# Patient Record
Sex: Female | Born: 1955 | Race: White | Hispanic: No | Marital: Married | Smoking: Current every day smoker
Health system: Southern US, Community
[De-identification: ages and names within clinical notes are randomized; demographics above are authoritative.]

## PROBLEM LIST (undated history)

## (undated) DIAGNOSIS — T1491XA Suicide attempt, initial encounter: Secondary | ICD-10-CM

## (undated) DIAGNOSIS — I10 Essential (primary) hypertension: Secondary | ICD-10-CM

## (undated) DIAGNOSIS — F329 Major depressive disorder, single episode, unspecified: Secondary | ICD-10-CM

## (undated) DIAGNOSIS — M5136 Other intervertebral disc degeneration, lumbar region: Secondary | ICD-10-CM

## (undated) DIAGNOSIS — K589 Irritable bowel syndrome without diarrhea: Secondary | ICD-10-CM

## (undated) DIAGNOSIS — F419 Anxiety disorder, unspecified: Secondary | ICD-10-CM

## (undated) DIAGNOSIS — F32A Depression, unspecified: Secondary | ICD-10-CM

## (undated) DIAGNOSIS — T7840XA Allergy, unspecified, initial encounter: Secondary | ICD-10-CM

## (undated) DIAGNOSIS — M51369 Other intervertebral disc degeneration, lumbar region without mention of lumbar back pain or lower extremity pain: Secondary | ICD-10-CM

## (undated) DIAGNOSIS — M5430 Sciatica, unspecified side: Secondary | ICD-10-CM

## (undated) DIAGNOSIS — E785 Hyperlipidemia, unspecified: Secondary | ICD-10-CM

## (undated) DIAGNOSIS — F4310 Post-traumatic stress disorder, unspecified: Secondary | ICD-10-CM

## (undated) HISTORY — PX: ABDOMINAL HYSTERECTOMY: SHX81

## (undated) HISTORY — DX: Essential (primary) hypertension: I10

## (undated) HISTORY — DX: Post-traumatic stress disorder, unspecified: F43.10

## (undated) HISTORY — DX: Allergy, unspecified, initial encounter: T78.40XA

## (undated) HISTORY — PX: SHOULDER ARTHROSCOPY WITH ROTATOR CUFF REPAIR: SHX5685

## (undated) HISTORY — DX: Irritable bowel syndrome, unspecified: K58.9

## (undated) HISTORY — PX: MANDIBLE SURGERY: SHX707

## (undated) HISTORY — DX: Hyperlipidemia, unspecified: E78.5

---

## 2004-03-21 ENCOUNTER — Ambulatory Visit: Payer: Self-pay | Admitting: Anesthesiology

## 2004-06-19 HISTORY — PX: NECK SURGERY: SHX720

## 2004-07-04 ENCOUNTER — Ambulatory Visit: Payer: Self-pay | Admitting: Anesthesiology

## 2004-08-30 ENCOUNTER — Ambulatory Visit: Payer: Self-pay | Admitting: Anesthesiology

## 2005-04-03 ENCOUNTER — Ambulatory Visit: Payer: Self-pay

## 2005-05-01 ENCOUNTER — Ambulatory Visit: Payer: Self-pay | Admitting: Unknown Physician Specialty

## 2006-03-22 ENCOUNTER — Ambulatory Visit: Payer: Self-pay | Admitting: Unknown Physician Specialty

## 2006-06-21 ENCOUNTER — Ambulatory Visit: Payer: Self-pay | Admitting: Unknown Physician Specialty

## 2006-09-05 ENCOUNTER — Emergency Department: Payer: Self-pay | Admitting: Emergency Medicine

## 2006-10-30 ENCOUNTER — Emergency Department: Payer: Self-pay | Admitting: Emergency Medicine

## 2007-11-11 ENCOUNTER — Emergency Department: Payer: Self-pay | Admitting: Emergency Medicine

## 2007-11-21 ENCOUNTER — Emergency Department: Payer: Self-pay | Admitting: Emergency Medicine

## 2009-03-16 IMAGING — CR PELVIS - 1-2 VIEW
1 series · 1 of 1 positions shown · non-contrast
Comparison: none

REASON FOR EXAM: fall, pain in ramus of hip
COMMENTS:   LMP: Post Hysterectomy

PROCEDURE:     DXR - DXR PELVIS AP ONLY  - November 21, 2007  [DATE]
RESULT:     No fracture, dislocation or other acute bony abnormality is
identified. The hip joint spaces are bilaterally symmetrical.  Sacroiliac
joints are normal in appearance.

[view not recorded]
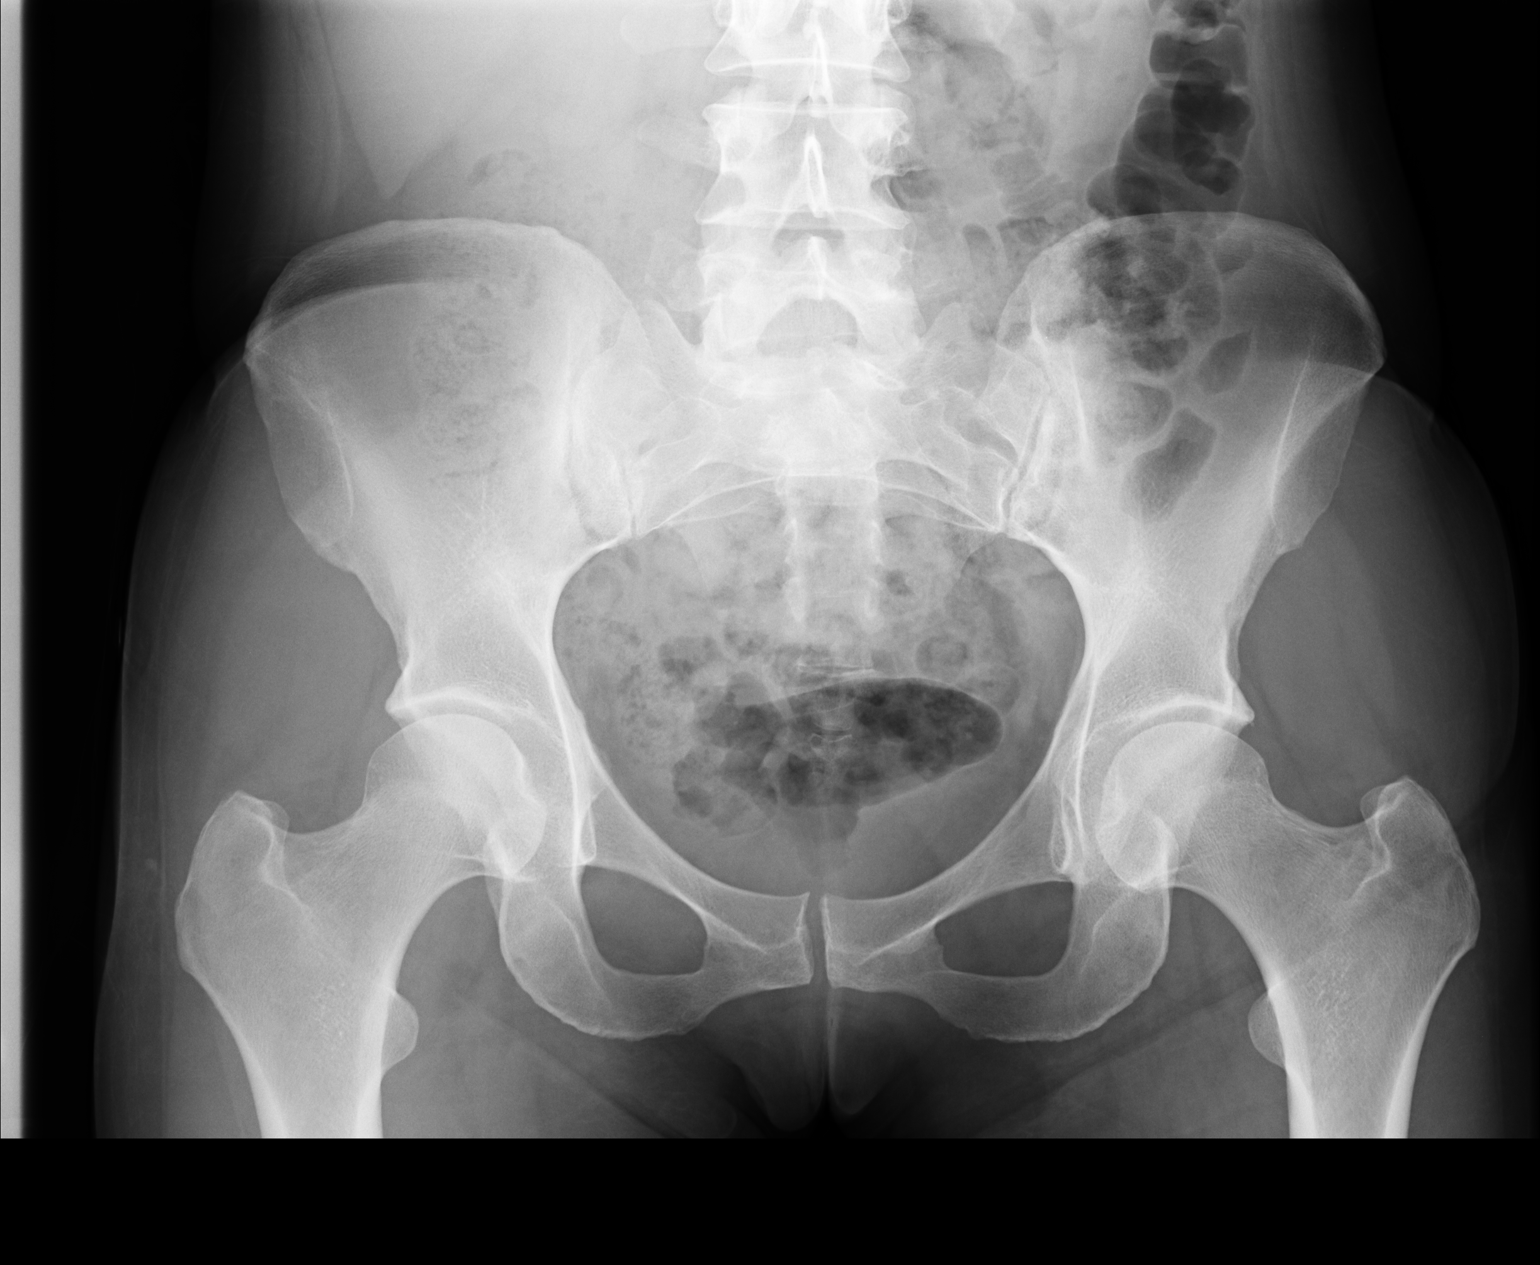

[1 of 1 positions shown; findings below may reference images not displayed]

IMPRESSION: No significant abnormalities are noted.

## 2009-12-14 ENCOUNTER — Inpatient Hospital Stay: Payer: Self-pay | Admitting: Psychiatry

## 2010-02-15 ENCOUNTER — Emergency Department: Payer: Self-pay | Admitting: Emergency Medicine

## 2011-10-31 ENCOUNTER — Ambulatory Visit: Payer: Self-pay | Admitting: Pediatrics

## 2012-07-14 ENCOUNTER — Emergency Department: Payer: Self-pay | Admitting: Emergency Medicine

## 2012-10-03 DIAGNOSIS — K219 Gastro-esophageal reflux disease without esophagitis: Secondary | ICD-10-CM | POA: Insufficient documentation

## 2012-10-03 DIAGNOSIS — E785 Hyperlipidemia, unspecified: Secondary | ICD-10-CM | POA: Insufficient documentation

## 2013-10-17 DIAGNOSIS — F329 Major depressive disorder, single episode, unspecified: Secondary | ICD-10-CM | POA: Insufficient documentation

## 2014-10-03 ENCOUNTER — Emergency Department
Admit: 2014-10-03 | Disposition: A | Discharge status: Home / Self Care | Payer: Self-pay | Admitting: Physician Assistant

## 2014-10-03 LAB — BASIC METABOLIC PANEL
Anion Gap: 10 (ref 7–16)
BUN: 13 mg/dL
CHLORIDE: 105 mmol/L
CO2: 25 mmol/L
Calcium, Total: 9.5 mg/dL
Creatinine: 0.65 mg/dL
EGFR (African American): >60
Glucose: 106 mg/dL — ABNORMAL HIGH
POTASSIUM: 3.7 mmol/L
Sodium: 140 mmol/L

## 2014-10-03 LAB — CBC WITH DIFFERENTIAL/PLATELET
BASOS ABS: 0 10*3/uL (ref 0.0–0.1)
BASOS PCT: 0.5 %
EOS ABS: 0.1 10*3/uL (ref 0.0–0.7)
Eosinophil %: 1.1 %
HCT: 42 % (ref 35.0–47.0)
HGB: 13.9 g/dL (ref 12.0–16.0)
LYMPHS PCT: 36.8 %
Lymphocyte #: 3.5 10*3/uL (ref 1.0–3.6)
MCH: 29.6 pg (ref 26.0–34.0)
MCHC: 33.2 g/dL (ref 32.0–36.0)
MCV: 89 fL (ref 80–100)
MONO ABS: 0.5 x10 3/mm (ref 0.2–0.9)
Monocyte %: 5.5 %
NEUTROS PCT: 56.1 %
Neutrophil #: 5.3 10*3/uL (ref 1.4–6.5)
PLATELETS: 358 10*3/uL (ref 150–440)
RBC: 4.71 10*6/uL (ref 3.80–5.20)
RDW: 14.7 % — AB (ref 11.5–14.5)
WBC: 9.4 10*3/uL (ref 3.6–11.0)

## 2014-10-03 LAB — URINALYSIS, COMPLETE
Bilirubin,UR: NEGATIVE
Blood: NEGATIVE
GLUCOSE, UR: NEGATIVE mg/dL (ref 0–75)
LEUKOCYTE ESTERASE: NEGATIVE
NITRITE: NEGATIVE
Ph: 5 (ref 4.5–8.0)
Protein: NEGATIVE
Specific Gravity: 1.028 (ref 1.003–1.030)

## 2014-10-03 LAB — WET PREP, GENITAL

## 2014-10-03 LAB — GC/CHLAMYDIA PROBE AMP

## 2015-03-08 ENCOUNTER — Encounter: Payer: Self-pay | Admitting: Emergency Medicine

## 2015-03-08 ENCOUNTER — Emergency Department
Admission: EM | Admit: 2015-03-08 | Discharge: 2015-03-08 | Discharge status: Against Medical Advice | Payer: No Typology Code available for payment source | Attending: Emergency Medicine | Admitting: Emergency Medicine

## 2015-03-08 DIAGNOSIS — Z880 Allergy status to penicillin: Secondary | ICD-10-CM | POA: Insufficient documentation

## 2015-03-08 DIAGNOSIS — Z720 Tobacco use: Secondary | ICD-10-CM | POA: Diagnosis not present

## 2015-03-08 DIAGNOSIS — M549 Dorsalgia, unspecified: Secondary | ICD-10-CM | POA: Diagnosis not present

## 2015-03-08 DIAGNOSIS — R300 Dysuria: Secondary | ICD-10-CM | POA: Insufficient documentation

## 2015-03-08 DIAGNOSIS — N898 Other specified noninflammatory disorders of vagina: Secondary | ICD-10-CM | POA: Diagnosis not present

## 2015-03-08 DIAGNOSIS — R109 Unspecified abdominal pain: Secondary | ICD-10-CM | POA: Diagnosis present

## 2015-03-08 HISTORY — DX: Anxiety disorder, unspecified: F41.9

## 2015-03-08 LAB — CBC
HCT: 41.6 % (ref 35.0–47.0)
Hemoglobin: 14 g/dL (ref 12.0–16.0)
MCH: 29.8 pg (ref 26.0–34.0)
MCHC: 33.5 g/dL (ref 32.0–36.0)
MCV: 88.7 fL (ref 80.0–100.0)
PLATELETS: 326 10*3/uL (ref 150–440)
RBC: 4.69 MIL/uL (ref 3.80–5.20)
RDW: 14.4 % (ref 11.5–14.5)
WBC: 8.2 10*3/uL (ref 3.6–11.0)

## 2015-03-08 LAB — COMPREHENSIVE METABOLIC PANEL
ALK PHOS: 67 U/L (ref 38–126)
ALT: 63 U/L — AB (ref 14–54)
AST: 70 U/L — ABNORMAL HIGH (ref 15–41)
Albumin: 4.6 g/dL (ref 3.5–5.0)
Anion gap: 9 (ref 5–15)
BUN: 8 mg/dL (ref 6–20)
CALCIUM: 9.7 mg/dL (ref 8.9–10.3)
CO2: 26 mmol/L (ref 22–32)
CREATININE: 0.59 mg/dL (ref 0.44–1.00)
Chloride: 103 mmol/L (ref 101–111)
GFR calc Af Amer: >60 mL/min (ref >60)
GFR calc non Af Amer: >60 mL/min (ref >60)
Glucose, Bld: 102 mg/dL — ABNORMAL HIGH (ref 65–99)
Potassium: 4.3 mmol/L (ref 3.5–5.1)
SODIUM: 138 mmol/L (ref 135–145)
Total Bilirubin: 0.8 mg/dL (ref 0.3–1.2)
Total Protein: 8.3 g/dL — ABNORMAL HIGH (ref 6.5–8.1)

## 2015-03-08 LAB — URINALYSIS COMPLETE WITH MICROSCOPIC (ARMC ONLY)
BILIRUBIN URINE: NEGATIVE
Bacteria, UA: NONE SEEN
Glucose, UA: NEGATIVE mg/dL
Hgb urine dipstick: NEGATIVE
Ketones, ur: NEGATIVE mg/dL
Leukocytes, UA: NEGATIVE
Nitrite: NEGATIVE
Protein, ur: NEGATIVE mg/dL
SPECIFIC GRAVITY, URINE: 1.006 (ref 1.005–1.030)
pH: 7 (ref 5.0–8.0)

## 2015-03-08 NOTE — ED Notes (Signed)
Pt to ed with c/o abd pain and nausea x 1 year.  Pt also reports burning with urination. Denies vomiting, denies diarrhea.

## 2015-03-08 NOTE — ED Notes (Signed)
Patient opened her room door and yelled "what the fuck is going on" and then about a minute later stormed out of her room leaving while saying " 45 minutes like that, what the fuck is wrong with you"

## 2015-03-08 NOTE — ED Provider Notes (Signed)
[facility] Emergency Department Provider Note  ____________________________________________  Time seen: 1435  I have reviewed the triage vital signs and the nursing notes.   HISTORY  Chief Complaint Abdominal Pain     HPI Katrina Holmes is a 59 y.o. female who presents to the emergency department due to dysuria. She reports she urinated this morning and it "felt like my bladder was going to come out".  The patient also reports having a vaginal discharge, yellow in color.  The patient reports these problems and been going on for one year. They began after she had spent time in her sister's hot of. She says she thinks that the pH was off. She did go to see her regular doctor at that time and was treated for a urinary tract infection. She has not been able to see her doctor recently.  During our discussion, when I tried to pin down acute signs and symptoms, the patient keeps referring to the start of all the symptoms being approximately one year ago and being consistent. She reports she has had other episodes of severe dysuria. She denies having a bulge or other physical sign of bladder prolapse. She is status post hysterectomy many years ago.    Past Medical History  Diagnosis Date  . Anxiety     There are no active problems to display for this patient.   History reviewed. No pertinent past surgical history.  No current outpatient prescriptions on file.  Allergies Nsaids and Penicillins  History reviewed. No pertinent family history.  Social History Social History  Substance Use Topics  . Smoking status: Current Every Day Smoker  . Smokeless tobacco: None  . Alcohol Use: No    Review of Systems  Constitutional: Negative for fever. ENT: Negative for sore throat. Cardiovascular: Negative for chest pain. Respiratory: Negative for shortness of breath. Gastrointestinal: Negative for abdominal pain, vomiting and diarrhea. Genitourinary:  Positive for dysuria. Positive for vaginal discharge Musculoskeletal: History of back pain with sciatica into the left leg. Skin: Negative for rash. Neurological: Negative for headaches   10-point ROS otherwise negative.  ____________________________________________   PHYSICAL EXAM:  VITAL SIGNS: ED Triage Vitals  Enc Vitals Group     BP 03/08/15 1156 141/98 mmHg     Pulse Rate 03/08/15 1156 80     Resp 03/08/15 1156 20     Temp 03/08/15 1156 98.2 F (36.8 C)     Temp Source 03/08/15 1156 Oral     SpO2 03/08/15 1156 99 %     Weight 03/08/15 1156 130 lb (58.968 kg)     Height 03/08/15 1156  (1.676 m)     Head Cir --      Peak Flow --      Pain Score 03/08/15 1156 7     Pain Loc --      Pain Edu? --      Excl. in GC? --     Constitutional: Alert and oriented. Appears slightly uncomfortable but overall no acute distress.[facility]   Head: Normocephalic and atraumatic.   Nose: No congestion/rhinnorhea. Cardiovascular: Normal rate, regular rhythm, no murmur noted Respiratory:  Normal respiratory effort, no tachypnea.    Breath sounds are clear and equal bilaterally.  Gastrointestinal: Soft. Minimal tenderness in the central suprapubic area. No distention.  Back: No muscle spasm, mild tenderness tenderness, no CVA tenderness. Musculoskeletal: No deformity noted. Nontender with normal range of motion in all extremities.  No noted edema. Neurologic:  Normal speech  and language. No gross focal neurologic deficits are appreciated.  Skin:  Skin is warm, dry. No rash noted. Psychiatric: Mood and affect are normal. Speech and behavior are normal.  Pelvic:  - Patient left the emergency department before this could be done ____________________________________________    LABS (pertinent positives/negatives)  Labs Reviewed  COMPREHENSIVE METABOLIC PANEL - Abnormal; Notable for the following:    Glucose, Bld 102 (*)    Total Protein 8.3 (*)    AST 70 (*)    ALT 63 (*)     All other components within normal limits  URINALYSIS COMPLETEWITH MICROSCOPIC ([facility] ONLY) - Abnormal; Notable for the following:    Color, Urine STRAW (*)    APPearance CLEAR (*)    Squamous Epithelial / LPF 0-5 (*)    All other components within normal limits  CBC      ____________________________________________   INITIAL IMPRESSION / ASSESSMENT AND PLAN / ED COURSE  Pertinent labs & imaging results that were available during my care of the patient were reviewed by me and considered in my medical decision making (see chart for details).  59 year old female with complaint of vaginal discharge and dysuria. Her lab tests are reasonable. She has a minimal elevation of her transaminases. I discussed this with the patient. No history of hepatitis. She does not drink on a regular basis. Should 2 glasses of wine this weekend.  The patient told the nurse separately from me that she has had bacterial vaginosis twice in the past year and has been treated for it. The patient's focus seems to be this vaginal discharge. We will do a pelvic exam to assess for bacterial vaginosis as well as to insure that she does not have any appearance of a bladder prolapse.  ----------------------------------------- 3:41 PM on 03/08/2015 -----------------------------------------  I've just been told that the patient left angry from her room. She had been waiting approximately half an hour for me to return to do the pelvic exam. The emergency department is currently busy, The patient's condition was stable, and I wanted to be sure to see the 2 patients that I had not seen yet were stable and had their evaluation and treatment was underway.   ----------------------------------------- 3:47 PM on 03/08/2015 -----------------------------------------  I just called and spoke with the patient. She answered the phone promptly. She reports that her sciatica was bothering her more and she couldn't remain seated and she  was agitated so she left. I offered to have her come back and we would complete the examination, but she prefers to go home and rest her back currently. I offered to call in a prescription for metronidazole for her, but the patient reports that she has been metronidazole 4 times this year. With that history, I do not believe there is benefit in treatment with metronidazole without further evaluation and the patient agrees. I did not call in a prescription for medication for her.  I discussed options with her for further care. She reports her doctor's out on medical leave until October. I suggested she go see a gynecologist for the vaginal discharge and checked the on-call schedule. [facility] is on call for unassigned patients. I told her to call [facility] for follow-up. She then told me that her regular doctor does woman's care. The patient prefers to wait for her doctor to come back to town. I advised her to return to the emergency department if she has further urgent concerns. She seemed appreciative of my phone call and the  care that I tried to provide.  ____________________________________________   FINAL CLINICAL IMPRESSION(S) / ED DIAGNOSES  Final diagnoses:  Dysuria  Vaginal discharge      Darien Ramus, MD 03/08/15 1550

## 2015-04-29 ENCOUNTER — Emergency Department
Admission: EM | Admit: 2015-04-29 | Discharge: 2015-04-29 | Disposition: A | Discharge status: Home / Self Care | Payer: No Typology Code available for payment source | Attending: Emergency Medicine | Admitting: Emergency Medicine

## 2015-04-29 ENCOUNTER — Encounter: Payer: Self-pay | Admitting: *Deleted

## 2015-04-29 ENCOUNTER — Emergency Department: Payer: No Typology Code available for payment source

## 2015-04-29 DIAGNOSIS — Z880 Allergy status to penicillin: Secondary | ICD-10-CM | POA: Insufficient documentation

## 2015-04-29 DIAGNOSIS — Z720 Tobacco use: Secondary | ICD-10-CM | POA: Insufficient documentation

## 2015-04-29 DIAGNOSIS — R110 Nausea: Secondary | ICD-10-CM | POA: Insufficient documentation

## 2015-04-29 DIAGNOSIS — R309 Painful micturition, unspecified: Secondary | ICD-10-CM | POA: Insufficient documentation

## 2015-04-29 DIAGNOSIS — R109 Unspecified abdominal pain: Secondary | ICD-10-CM | POA: Diagnosis present

## 2015-04-29 DIAGNOSIS — R1031 Right lower quadrant pain: Secondary | ICD-10-CM | POA: Insufficient documentation

## 2015-04-29 DIAGNOSIS — R630 Anorexia: Secondary | ICD-10-CM | POA: Insufficient documentation

## 2015-04-29 LAB — URINALYSIS COMPLETE WITH MICROSCOPIC (ARMC ONLY)
BACTERIA UA: NONE SEEN
BILIRUBIN URINE: NEGATIVE
GLUCOSE, UA: NEGATIVE mg/dL
Hgb urine dipstick: NEGATIVE
Ketones, ur: NEGATIVE mg/dL
Leukocytes, UA: NEGATIVE
Nitrite: NEGATIVE
Protein, ur: NEGATIVE mg/dL
Specific Gravity, Urine: 1.003 — ABNORMAL LOW (ref 1.005–1.030)
Squamous Epithelial / LPF: NONE SEEN
pH: 6 (ref 5.0–8.0)

## 2015-04-29 LAB — COMPREHENSIVE METABOLIC PANEL
ALK PHOS: 54 U/L (ref 38–126)
ALT: 18 U/L (ref 14–54)
ANION GAP: 7 (ref 5–15)
AST: 20 U/L (ref 15–41)
Albumin: 4.2 g/dL (ref 3.5–5.0)
BILIRUBIN TOTAL: 0.7 mg/dL (ref 0.3–1.2)
BUN: 10 mg/dL (ref 6–20)
CALCIUM: 9.3 mg/dL (ref 8.9–10.3)
CO2: 24 mmol/L (ref 22–32)
Chloride: 106 mmol/L (ref 101–111)
Creatinine, Ser: 0.45 mg/dL (ref 0.44–1.00)
Glucose, Bld: 94 mg/dL (ref 65–99)
Potassium: 3.8 mmol/L (ref 3.5–5.1)
Sodium: 137 mmol/L (ref 135–145)
TOTAL PROTEIN: 7.7 g/dL (ref 6.5–8.1)

## 2015-04-29 LAB — CBC
HCT: 39.2 % (ref 35.0–47.0)
HEMOGLOBIN: 13 g/dL (ref 12.0–16.0)
MCH: 28.9 pg (ref 26.0–34.0)
MCHC: 33.2 g/dL (ref 32.0–36.0)
MCV: 87.1 fL (ref 80.0–100.0)
Platelets: 327 10*3/uL (ref 150–440)
RBC: 4.5 MIL/uL (ref 3.80–5.20)
RDW: 14 % (ref 11.5–14.5)
WBC: 8.9 10*3/uL (ref 3.6–11.0)

## 2015-04-29 LAB — LIPASE, BLOOD: Lipase: 39 U/L (ref 11–51)

## 2015-04-29 MED ORDER — ONDANSETRON 4 MG PO TBDP
4.0000 mg | ORAL_TABLET | Freq: Once | ORAL | Status: AC
  Administered 2015-04-29: 4 mg via ORAL
  Filled 2015-04-29: qty 1

## 2015-04-29 MED ORDER — HYDROMORPHONE HCL 1 MG/ML IJ SOLN
1.0000 mg | Freq: Once | INTRAMUSCULAR | Status: AC
  Administered 2015-04-29: 1 mg via INTRAVENOUS
  Filled 2015-04-29: qty 1

## 2015-04-29 MED ORDER — PROCHLORPERAZINE MALEATE 10 MG PO TABS: 10.0000 mg | ORAL_TABLET | Freq: Four times a day (QID) | ORAL | Status: DC | PRN

## 2015-04-29 MED ORDER — IOHEXOL 240 MG/ML SOLN
25.0000 mL | Freq: Once | INTRAMUSCULAR | Status: AC | PRN
  Administered 2015-04-29: 25 mL via ORAL

## 2015-04-29 MED ORDER — TRAMADOL HCL 50 MG PO TABS: 50.0000 mg | ORAL_TABLET | Freq: Four times a day (QID) | ORAL | Status: DC | PRN

## 2015-04-29 MED ORDER — TRAMADOL HCL 50 MG PO TABS
50.0000 mg | ORAL_TABLET | Freq: Once | ORAL | Status: AC
  Administered 2015-04-29: 50 mg via ORAL
  Filled 2015-04-29: qty 1

## 2015-04-29 MED ORDER — ONDANSETRON HCL 4 MG/2ML IJ SOLN
4.0000 mg | Freq: Once | INTRAMUSCULAR | Status: AC
  Administered 2015-04-29: 4 mg via INTRAVENOUS
  Filled 2015-04-29: qty 2

## 2015-04-29 MED ORDER — IOHEXOL 300 MG/ML  SOLN
100.0000 mL | Freq: Once | INTRAMUSCULAR | Status: AC | PRN
  Administered 2015-04-29: 100 mL via INTRAVENOUS

## 2015-04-29 NOTE — ED Provider Notes (Signed)
Time Seen: Approximately ----------------------------------------- 11:04 AM on 04/29/2015 -----------------------------------------   I have reviewed the triage notes  Chief Complaint: Abdominal Pain   History of Present Illness: Katrina Holmes is a 59 y.o. female who presents with a 2 to three-day history of constant to waves of discomfort in the right lower quadrant. She describes the pain as sharp pain with some associated nausea decreased appetite, she denies any persistent vomiting. She is not aware of any fever home at home. She denies any flank pain. She denies any current vaginal discharge or drainage. She states she has noticed some burning with urination but otherwise no frequency or blood in her urine.   Past Medical History  Diagnosis Date  . Anxiety     There are no active problems to display for this patient.   History reviewed. No pertinent past surgical history.  History reviewed. No pertinent past surgical history.  No current outpatient prescriptions on file.  Allergies:  Nsaids and Penicillins  Family History: No family history on file.  Social History: Social History  Substance Use Topics  . Smoking status: Current Every Day Smoker -- 0.50 packs/day    Types: Cigarettes  . Smokeless tobacco: None  . Alcohol Use: No     Review of Systems:   10 point review of systems was performed and was otherwise negative:  Constitutional: No fever Eyes: No visual disturbances ENT: No sore throat, ear pain Cardiac: No chest pain Respiratory: No shortness of breath, wheezing, or stridor Abdomen: Sharp constant right lower quadrant pain, no current emesis, No diarrhea Endocrine: No weight loss, No night sweats Extremities: No peripheral edema, cyanosis Skin: No rashes, easy bruising Neurologic: No focal weakness, trouble with speech or swollowing Urologic: NoHematuria, or urinary frequency   Physical Exam:  ED Triage Vitals  Enc Vitals Group   BP 04/29/15 1022 146/78 mmHg     Pulse Rate 04/29/15 1022 91     Resp 04/29/15 1022 20     Temp 04/29/15 1022 98.6 F (37 C)     Temp Source 04/29/15 1022 Oral     SpO2 04/29/15 1022 98 %     Weight 04/29/15 1022 130 lb (58.968 kg)     Height 04/29/15 1022  (1.676 m)     Head Cir --      Peak Flow --      Pain Score 04/29/15 1023 8     Pain Loc --      Pain Edu? --      Excl. in GC? --     General: Awake , Alert , and Oriented times 3; GCS 15 Head: Normal cephalic , atraumatic Eyes: Pupils equal , round, reactive to light Nose/Throat: No nasal drainage, patent upper airway without erythema or exudate.  Neck: Supple, Full range of motion, No anterior adenopathy or palpable thyroid masses Lungs: Clear to ascultation without wheezes , rhonchi, or rales Heart: Regular rate, regular rhythm without murmurs , gallops , or rubs Abdomen pain to deep palpation right lower quadrant without any rebound, guarding , or rigidity; bowel sounds positive and symmetric in all 4 quadrants. No organomegaly .       Mild tenderness toward McBurney's point but most the pain is toward her right groin region. No palpable masses Extremities: 2 plus symmetric pulses. No edema, clubbing or cyanosis Neurologic: normal ambulation, Motor symmetric without deficits, sensory intact Skin: warm, dry, no rashes   Labs:   All laboratory work was reviewed including any  pertinent negatives or positives listed below:  Labs Reviewed  CBC  LIPASE, BLOOD  COMPREHENSIVE METABOLIC PANEL  URINALYSIS COMPLETEWITH MICROSCOPIC ([facility] ONLY)    E  Radiology:  EXAM: CT ABDOMEN AND PELVIS WITH CONTRAST  TECHNIQUE: Multidetector CT imaging of the abdomen and pelvis was performed using the standard protocol following bolus administration of intravenous contrast.  CONTRAST: 100mL OMNIPAQUE IOHEXOL 300 MG/ML IV. Oral contrast was also administered.  COMPARISON: 02/15/2010.  FINDINGS: Lower chest: Heart size  normal. Visualized lung bases clear.  Hepatobiliary: Liver normal in size and appearance. Gallbladder normal in appearance without calcified gallstones. No biliary ductal dilation.  Pancreas: Normal in appearance without evidence of mass, ductal dilation, or inflammation.  Spleen: Normal in size and appearance.  Adrenals/Urinary Tract: Normal appearing adrenal glands. Kidneys normal in size and appearance without focal parenchymal abnormality. No evidence of urinary tract calculi or obstruction. Normal-appearing urinary bladder.  Stomach/Bowel: Stomach normal in appearance for the degree of distention. Normal-appearing small bowel. Normal-appearing colon with expected stool burden. Cecum extends low in the pelvis. A long retrocecal appendix extends from the lower pelvis back up into the upper pelvis and is normal in appearance.  Vascular/Lymphatic: No pathologic lymphadenopathy. 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 Patent visceral arteries.  Reproductive: Uterus not visualized and presumed surgically absent. No adnexal masses.  Other: None.  Musculoskeletal: Facet degenerative changes at L5-S1. Regional skeleton intact otherwise without acute abnormalities.  IMPRESSION: 1. No abnormalities identified to explain right lower quadrant abdominal pain. Specifically, no evidence of appendicitis. 2. 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   Electronically Signed By: Hulan Saashomas Lawrence M.D. On: 04/29/2015 13:29   I personally reviewed the radiologic studies    ED Course:  Differential diagnosis includes but is not exclusive to ovarian cyst, ovarian torsion, acute appendicitis, urinary tract infection, endometriosis, bowel obstruction, colitis, renal colic, gastroenteritis, etc. Differential also included ischemic bowel disease. I felt given her current clinical presentation and objective findings her was  no obvious surgical issues with her presentation at this time. Her abdominal CT is negative and laboratory work is all within normal limits. Urinary sample does not show any indications of an infection, etc. Further bedside discussion with the patient apparently she's had other episodes very similar to this as far as her abdominal pain goes and is had colonoscopy as an outpatient that was benign. Patient states she was on Bentyl but maybe had an allergic reaction. This may be viral in its etiology or irritable bowel syndrome, either way, I felt patient treated on an outpatient basis.    Assessment:  Acute unspecified abdominal pain     Plan:  Outpatient management Patient was advised to return immediately if condition worsens. Patient was advised to follow up with her primary care physician or other specialized physicians involved and in their current assessment. Patient was advised especially return here shows a fever, bloody stool, uncontrolled vomiting. She was prescribed Ultram for pain along with continuing Tylenol over-the-counter or home and Compazine for the nausea.            Jennye MoccasinBrian S Deveion Denz, MD 04/29/15 [phone]

## 2015-04-29 NOTE — ED Notes (Signed)
PT FINISHED DRINKING CONTRAST. CT NOTIFIED. 

## 2015-04-29 NOTE — ED Notes (Signed)
Pt reports RLQ sharp and stabbing abdominal pain X 3 days. Nausea, not relieved by medication. Pt denies vomiting or diarrhea. Pt also reports that she feels wetness in her brief and itching which began around the same time as abdominal pain. Hx of vaginitis X 1 year ago after getting into a hot tub. Pt states that she has not been sexually active X 2 years. Pt alert and oriented X4, active, cooperative, pt in NAD. RR even and unlabored, color WNL.

## 2015-04-29 NOTE — Discharge Instructions (Signed)

## 2015-04-29 NOTE — ED Notes (Signed)
Pt reports nausea and abdominal cramping below the navel since Sunday

## 2015-05-25 ENCOUNTER — Ambulatory Visit: Payer: No Typology Code available for payment source | Attending: Pain Medicine | Admitting: Pain Medicine

## 2015-05-25 ENCOUNTER — Encounter: Payer: Self-pay | Admitting: Pain Medicine

## 2015-05-25 VITALS — BP 134/67 | HR 95 | Temp 98.5°F | Resp 16 | Ht 66.0 in | Wt 130.0 lb

## 2015-05-25 DIAGNOSIS — M47816 Spondylosis without myelopathy or radiculopathy, lumbar region: Secondary | ICD-10-CM

## 2015-05-25 DIAGNOSIS — M5126 Other intervertebral disc displacement, lumbar region: Secondary | ICD-10-CM | POA: Insufficient documentation

## 2015-05-25 DIAGNOSIS — F419 Anxiety disorder, unspecified: Secondary | ICD-10-CM | POA: Insufficient documentation

## 2015-05-25 DIAGNOSIS — K219 Gastro-esophageal reflux disease without esophagitis: Secondary | ICD-10-CM | POA: Diagnosis not present

## 2015-05-25 DIAGNOSIS — M533 Sacrococcygeal disorders, not elsewhere classified: Secondary | ICD-10-CM | POA: Diagnosis not present

## 2015-05-25 DIAGNOSIS — M19011 Primary osteoarthritis, right shoulder: Secondary | ICD-10-CM

## 2015-05-25 DIAGNOSIS — M79604 Pain in right leg: Secondary | ICD-10-CM | POA: Diagnosis present

## 2015-05-25 DIAGNOSIS — M79605 Pain in left leg: Secondary | ICD-10-CM | POA: Diagnosis present

## 2015-05-25 DIAGNOSIS — M5136 Other intervertebral disc degeneration, lumbar region: Secondary | ICD-10-CM | POA: Insufficient documentation

## 2015-05-25 DIAGNOSIS — M48062 Spinal stenosis, lumbar region with neurogenic claudication: Secondary | ICD-10-CM

## 2015-05-25 DIAGNOSIS — M47812 Spondylosis without myelopathy or radiculopathy, cervical region: Secondary | ICD-10-CM

## 2015-05-25 DIAGNOSIS — Z9889 Other specified postprocedural states: Secondary | ICD-10-CM

## 2015-05-25 DIAGNOSIS — M51369 Other intervertebral disc degeneration, lumbar region without mention of lumbar back pain or lower extremity pain: Secondary | ICD-10-CM | POA: Insufficient documentation

## 2015-05-25 DIAGNOSIS — M5030 Other cervical disc degeneration, unspecified cervical region: Secondary | ICD-10-CM

## 2015-05-25 DIAGNOSIS — M19019 Primary osteoarthritis, unspecified shoulder: Secondary | ICD-10-CM | POA: Insufficient documentation

## 2015-05-25 DIAGNOSIS — M4806 Spinal stenosis, lumbar region: Secondary | ICD-10-CM | POA: Insufficient documentation

## 2015-05-25 DIAGNOSIS — F410 Panic disorder [episodic paroxysmal anxiety] without agoraphobia: Secondary | ICD-10-CM | POA: Diagnosis not present

## 2015-05-25 DIAGNOSIS — M545 Low back pain: Secondary | ICD-10-CM | POA: Diagnosis present

## 2015-05-25 NOTE — Progress Notes (Signed)
Safety precautions to be maintained throughout the outpatient stay will include: orient to surroundings, keep bed in low position, maintain call bell within reach at all times, provide assistance with transfer out of bed and ambulation.  

## 2015-05-25 NOTE — Patient Instructions (Addendum)
PLAN   Continue present medications  Lumbar epidural steroid injection to be performed at time return appointment  F/U PCP Dr. Delton PrairiePaul Tobin for evaliation of  BP and general medical  condition  F/U surgical evaluation. May consider pending follow-up evaluations  F/U neurological evaluation. May consider pending follow-up evaluations  May consider radiofrequency rhizolysis or intraspinal procedures pending response to present treatment and F/U evaluation   Patient to call [facility] should patient have concerns prior to scheduled return appointment. Epidural Steroid Injection An epidural steroid injection is given to relieve pain in your neck, back, or legs that is caused by the irritation or swelling of a nerve root. This procedure involves injecting a steroid and numbing medicine (anesthetic) into the epidural space. The epidural space is the space between the outer covering of your spinal cord and the bones that form your backbone (vertebra).  LET [facility] PROVID[facility] KNOW ABOUT:   Any allergies you have.  All medicines you are taking, including vitamins, herbs, eye drops, creams, and over-the-counter medicines such as aspirin.  Previous problems you or members of your family have had with the use of anesthetics.  Any blood disorders or blood clotting disorders you have.  Previous surgeries you have had.  Medical conditions you have. RISKS AND COMPLICATIONS Generally, this is a safe procedure. However, as with any procedure, complications can occur. Possible complications of epidural steroid injection include:  Headache.  Bleeding.  Infection.  Allergic reaction to the medicines.  Damage to your nerves. The response to this procedure depends on the underlying cause of the pain and its duration. People who have long-term (chronic) pain are less likely to benefit from epidural steroids than are those people whose pain comes on strong and suddenly. BEFORE THE  PROCEDURE   Ask your health care provider about changing or stopping your regular medicines. You may be advised to stop taking blood-thinning medicines a few days before the procedure.  You may be given medicines to reduce anxiety.  Arrange for someone to take you home after the procedure. PROCEDURE   You will remain awake during the procedure. You may receive medicine to make you relaxed.  You will be asked to lie on your stomach.  The injection site will be cleaned.  The injection site will be numbed with a medicine (local anesthetic).  A needle will be injected through your skin into the epidural space.  Your health care provider will use an X-ray machine to ensure that the steroid is delivered closest to the affected nerve. You may have minimal discomfort at this time.  Once the needle is in the right position, the local anesthetic and the steroid will be injected into the epidural space.  The needle will then be removed and a bandage will be applied to the injection site. AFTER THE PROCEDURE   You may be monitored for a short time before you go home.  You may feel weakness or numbness in your arm or leg, which disappears within hours.  You may be allowed to eat, drink, and take your regular medicine.  You may have soreness at the site of the injection.   This information is not intended to replace advice given to you by your health care provider. Make sure you discuss any questions you have with your health care provider.   Document Released: 09/12/2007 Document Revised: 02/05/2013 Document Reviewed: 11/22/2012 [employer] Interactive Patient Education [employer]

## 2015-05-25 NOTE — Progress Notes (Signed)
Subjective:    Patient ID: Katrina Holmes, female    DOB: 1955/12/26, 59 y.o.   MRN: [id number]  HPI  Patient sits 59-year-old female who comes to pain management at the request of Dr. Mar DaringPaul Christopher Tobin for further evaluation and treatment of pain involving the lower back lower extremity region predominantly. The patient is a history of pain involving the region of the neck of lesser degree. Patient states that her pain involves the lower back lower extremity region with radiates toward the left lower extremity greater than the right lower extremity. The patient is a past surgical intervention of the cervical region. The patient states that her pain began approximately 4 years ago. The patient attributed the pain to prior episodes of falls and other incidents without any 1 specific incident precipitating patient's pain. The patient stated that she had undergone prior evaluation and [facility] 2014. the patient stated that she was in hopes of being able to undergo treatment in pain management center since it was closer to her home. We discussed patient's condition and informed patient we will consider such treatment pending further assessment of patient's condition. The patient described her pain as aching and utilizing a gnawing burning constant cramping intermittent Crowell deep disabling distressing. We'll exhausting fearful getting longer horrible heavy hot nagging pressure-like pulsating punishing sharp shooting stated stabbing sickening including tendon throbbing tingling tiring uncomfortable sensation patient stated the pain awakened her from sleep and independent with abilities to go to sleep and was associated with tingling numbness spasms and weakness of the lower extremity. Patient stated the pain increased with bending kneeling lifting motion standings with sitting squatting stooping twisting  walking. Patient states the pain is decreased with resting sitting sleeping standing prior use of TENS unit relaxation therapy physical therapy stretching cold packs and lying down nerve blocks and medications we discussed patient's overall condition and informed patient that we'll consider patient for lumbar epidural steroid injection at time return appointment reviewed patient's prior MRI and explained to patient with use of charts and literature patient's condition. We also discussed further surgical and neurological evaluations as well as modifications of medications and other aspects of treatment regimen pending further review of patient's condition and response to recommended treatment regimen. The patient was understanding and agreement suggested treatment plan lumbar epidural steroid injection at time return appointment in attempt to decrease severity of patient's symptoms, minimize progression of symptoms, and avoid need for more involved treatment. The patient was with understanding and agreement suggested treatment plan      Review of Systems     Cardiovascular: Daily aspirin intake  Pulmonary: Unremarkable  Neurological: Unremarkable  Psychological: Anxiety Panic attacks  Gastrointestinal: Gastroesophageal reflux disease  Genitourinary: Unremarkable  Hematologic: Unremarkable  Endocrine: Unremarkable  Rheumatological: Unremarkable  Musculoskeletal: Unremarkable  Other significant: Weight loss      Objective:   Physical Exam  There was tenderness to palpation of the splenius capitis and occipitalis regions of mild to moderate degree with well-healed surgical scar of the cervical region without increased warmth and erythema in the region of the scar. There was unremarkable Spurling's maneuver. Palpation of the acromioclavicular and glenohumeral joint regions reproduces pain of mild-to-moderate degree. Palpation over the thoracic region was attends to  palpation of moderate degree with moderate muscle spasms noted in the thoracic paraspinal musculature region. Tinel and Phalen's maneuver were without increased pain of significant degree. There  was questionably decreased grip strength. Palpation over the lumbar paraspinal muscles lumbar facet region was attends to palpation of moderate to moderately severe degree with lateral bending and rotation reproducing moderate discomfort to severe discomfort. Straight leg raising limited proximal 20 without increased pain with dorsiflexion noted. There appeared to be negative clonus negative Homans. No definite sensory deficit or dermatomal dystrophy detected. There was tenderness to palpation of the PSIS and PII S region as well as the gluteal and piriformis musculature regions of moderate degree. DTRs were difficult to elicit patient had difficulty relaxing There was negative clonus negative Homans. Abdomen nontender with no costovertebral tenderness noted.      Assessment & Plan:     Degenerative disc disease lumbar spine L3 -4 broad-based posterior disc bulge with thickening of the ligamentum flavum L4-5 posterior disc bulge facet arthropathy and thickening of the ligamentum flavum resulting in moderate canal stenosis and moderate neural foraminal narrowing. L5-S1 broad-based disc bulge posteriorly thickening of the ligamentum  flavum resulting in spinal stenosis and moderate bilateral neural foraminal stenosis  Lumbar stenosis with neurogenic claudication  Lumbar facet syndrome  Sacroiliac joint dysfunction  Status post surgery cervical region  Cervical facet syndrome    PLAN   Continue present medications  Lumbar epidural steroid injection to be performed at time return appointment  F/U PCP Dr. Delton Prairie   for evaliation of  BP and general medical  condition  F/U surgical evaluation.Surgical evaluation as discussed  F/U neurological evaluation. [facility] EMG studies to be considered as  discussed  May consider radiofrequency rhizolysis or intraspinal procedures pending response to present treatment and F/U evaluation   Patient to call Pain Management Center should patient have concerns prior to scheduled return appointment.

## 2015-05-27 ENCOUNTER — Other Ambulatory Visit: Payer: Self-pay

## 2015-06-02 ENCOUNTER — Ambulatory Visit: Payer: No Typology Code available for payment source | Attending: Pain Medicine | Admitting: Pain Medicine

## 2015-06-02 ENCOUNTER — Encounter: Payer: Self-pay | Admitting: Pain Medicine

## 2015-06-02 DIAGNOSIS — M5136 Other intervertebral disc degeneration, lumbar region: Secondary | ICD-10-CM | POA: Insufficient documentation

## 2015-06-02 DIAGNOSIS — M19011 Primary osteoarthritis, right shoulder: Secondary | ICD-10-CM

## 2015-06-02 DIAGNOSIS — M5030 Other cervical disc degeneration, unspecified cervical region: Secondary | ICD-10-CM

## 2015-06-02 DIAGNOSIS — M4806 Spinal stenosis, lumbar region: Secondary | ICD-10-CM | POA: Diagnosis not present

## 2015-06-02 DIAGNOSIS — M545 Low back pain: Secondary | ICD-10-CM | POA: Diagnosis present

## 2015-06-02 DIAGNOSIS — M47816 Spondylosis without myelopathy or radiculopathy, lumbar region: Secondary | ICD-10-CM

## 2015-06-02 DIAGNOSIS — M5126 Other intervertebral disc displacement, lumbar region: Secondary | ICD-10-CM | POA: Diagnosis not present

## 2015-06-02 DIAGNOSIS — M47812 Spondylosis without myelopathy or radiculopathy, cervical region: Secondary | ICD-10-CM

## 2015-06-02 DIAGNOSIS — M48062 Spinal stenosis, lumbar region with neurogenic claudication: Secondary | ICD-10-CM

## 2015-06-02 DIAGNOSIS — Z9889 Other specified postprocedural states: Secondary | ICD-10-CM

## 2015-06-02 MED ORDER — SODIUM CHLORIDE 0.9 % IJ SOLN
20.0000 mL | Freq: Once | INTRAMUSCULAR | Status: AC
  Administered 2015-06-02 (×2)

## 2015-06-02 MED ORDER — SODIUM CHLORIDE 0.9 % IJ SOLN
INTRAMUSCULAR | Status: AC
  Filled 2015-06-02: qty 10

## 2015-06-02 MED ORDER — LACTATED RINGERS IV SOLN: 1000.0000 mL | INTRAVENOUS | Status: DC

## 2015-06-02 MED ORDER — BUPIVACAINE HCL (PF) 0.25 % IJ SOLN
INTRAMUSCULAR | Status: AC
  Filled 2015-06-02: qty 30

## 2015-06-02 MED ORDER — LIDOCAINE HCL (PF) 1 % IJ SOLN
10.0000 mL | Freq: Once | INTRAMUSCULAR | Status: AC
  Administered 2015-06-02: 11:00:00 via SUBCUTANEOUS

## 2015-06-02 MED ORDER — TRIAMCINOLONE ACETONIDE 40 MG/ML IJ SUSP
40.0000 mg | Freq: Once | INTRAMUSCULAR | Status: AC
  Administered 2015-06-02: 11:00:00

## 2015-06-02 MED ORDER — FENTANYL CITRATE (PF) 100 MCG/2ML IJ SOLN
INTRAMUSCULAR | Status: AC
  Administered 2015-06-02: 100 ug via INTRAVENOUS
  Filled 2015-06-02: qty 2

## 2015-06-02 MED ORDER — CIPROFLOXACIN IN D5W 400 MG/200ML IV SOLN
INTRAVENOUS | Status: AC
  Administered 2015-06-02: 400 mg via INTRAVENOUS
  Filled 2015-06-02: qty 200

## 2015-06-02 MED ORDER — FENTANYL CITRATE (PF) 100 MCG/2ML IJ SOLN
100.0000 ug | Freq: Once | INTRAMUSCULAR | Status: AC
  Administered 2015-06-02: 100 ug via INTRAVENOUS

## 2015-06-02 MED ORDER — MIDAZOLAM HCL 5 MG/5ML IJ SOLN
5.0000 mg | Freq: Once | INTRAMUSCULAR | Status: AC
  Administered 2015-06-02: 3 mg via INTRAVENOUS

## 2015-06-02 MED ORDER — ORPHENADRINE CITRATE 30 MG/ML IJ SOLN
INTRAMUSCULAR | Status: AC
  Filled 2015-06-02: qty 2

## 2015-06-02 MED ORDER — TRIAMCINOLONE ACETONIDE 40 MG/ML IJ SUSP
INTRAMUSCULAR | Status: AC
  Filled 2015-06-02: qty 1

## 2015-06-02 MED ORDER — MIDAZOLAM HCL 5 MG/5ML IJ SOLN
INTRAMUSCULAR | Status: AC
  Administered 2015-06-02: 3 mg via INTRAVENOUS
  Filled 2015-06-02: qty 5

## 2015-06-02 MED ORDER — CIPROFLOXACIN IN D5W 400 MG/200ML IV SOLN
400.0000 mg | Freq: Once | INTRAVENOUS | Status: AC
  Administered 2015-06-02: 400 mg via INTRAVENOUS

## 2015-06-02 MED ORDER — LIDOCAINE HCL (PF) 1 % IJ SOLN
INTRAMUSCULAR | Status: AC
  Filled 2015-06-02: qty 5

## 2015-06-02 MED ORDER — ORPHENADRINE CITRATE 30 MG/ML IJ SOLN
60.0000 mg | Freq: Once | INTRAMUSCULAR | Status: AC
  Administered 2015-06-02: 11:00:00 via INTRAMUSCULAR

## 2015-06-02 MED ORDER — CIPROFLOXACIN HCL 250 MG PO TABS: 250.0000 mg | ORAL_TABLET | Freq: Two times a day (BID) | ORAL | Status: DC

## 2015-06-02 MED ORDER — BUPIVACAINE HCL (PF) 0.25 % IJ SOLN
30.0000 mL | Freq: Once | INTRAMUSCULAR | Status: AC
Start: 2015-06-02 — End: 2015-06-02
  Administered 2015-06-02: 11:00:00

## 2015-06-02 NOTE — Progress Notes (Signed)
   Subjective:    Patient ID: Katrina Holmes, female    DOB: 01/21/1956, 59 y.o.   MRN: [id number]  HPI PROCEDURE PERFORMED: Lumbar epidural steroid injection   NOTE: The patient is a 59 y.o. female who returns to Pain Management Center for further evaluation and treatment of pain involving the lumbar and lower extremity region. MRI revealed the patient to be with  Degenerative disc disease lumbar spine L3 -4 broad-based posterior disc bulge with thickening of the ligamentum flavum L4-5 posterior disc bulge facet arthropathy and thickening of the ligamentum flavum resulting in moderate canal stenosis and moderate neural foraminal narrowing. L5-S1 broad-based disc bulge posteriorly thickening of the ligamentum  flavum resulting in spinal stenosis and moderate bilateral neural foraminal stenosis there is concern regarding patient's pain began due to lumbar stenosis with neurogenic claudication and lumbar radiculopathy. The risks, benefits, and expectations of the procedure have been discussed and explained to the patient who was understanding and in agreement with suggested treatment plan. We will proceed with lumbar epidural steroid injection as discussed and as explained to the patient who is willing to proceed with procedure as planned.   DESCRIPTION OF PROCEDURE: Lumbar epidural steroid injection with IV Versed, IV fentanyl conscious sedation, EKG, blood pressure, pulse, and pulse oximetry monitoring. The procedure was performed with the patient in the prone position under fluoroscopic guidance. A local anesthetic skin wheal of 1.5% plain lidocaine was accomplished at proposed entry site. An 18-gauge Tuohy epidural needle was inserted at the L 4 vertebral body level left of the midline via loss-of-resistance technique with negative heme and negative CSF return. A total of 4 mL of Preservative-Free normal saline with 40 mg of Kenalog injected incrementally via epidurally placed needle. Needle was  removed.  Myoneural block injections of the gluteal musculature region Following Betadine prep of proposed entry site a 22-gauge needle was inserted in the gluteal musculature region and following negative aspiration 2 cc of 0.25% bupivacaine with Norflex was injected for myoneural block injection of the gluteal musculature region times to   A total of 40 mg of Kenalog was utilized for the procedure.   The patient tolerated the injection well.    PLAN:   1. Medications: We will continue presently prescribed medications. 2. Will consider modification of treatment regimen pending response to treatment rendered on today's visit and follow-up evaluation. 3. The patient is to follow-up with primary care physician Dr.Tobin   regarding blood pressure and general medical condition status post lumbar epidural steroid injection performed on today's visit. 4. Surgical evaluation.Has been addressed  5. Neurological evaluation.May consider [facility] EMG studies and other studies  6. The patient may be a candidate for radiofrequency procedures, implantation device, and other treatment pending response to treatment and follow-up evaluation. 7. The patient has been advised to adhere to proper body mechanics and avoid activities which appear to aggravate condition. 8. The patient has been advised to call the Pain Management Center prior to scheduled return appointment should there be significant change in condition or should there be sign  The patient is understanding and agrees with the suggested  treatment plan    Review of Systems     Objective:   Physical Exam        Assessment & Plan:

## 2015-06-02 NOTE — Patient Instructions (Addendum)
  Continue present medications. Begin taking Cipro antibiotic today as prescribed  F/U PCP Dr. Delton PrairiePaul Tobin for evaliation of  BP and general medical  condition  F/U surgical evaluation. May consider pending follow-up evaluations  F/U neurological evaluation. May consider pending follow-up evaluations  May consider radiofrequency rhizolysis or intraspinal procedures pending response to present treatment and F/U evaluation   Patient to call Pain Management Center should patient have concerns prior to scheduled return appointment.   Pain Management Discharge Instructions  General Discharge Instructions :  If you need to reach your doctor call: Monday-Friday 8:00 am - 4:00 pm at [phone] or toll free [phone].  After clinic hours [phone] to have operator reach doctor.  Bring all of your medication bottles to all your appointments in the pain clinic.  To cancel or reschedule your appointment with Pain Management please remember to call 24 hours in advance to avoid a fee.  Refer to the educational materials which you have been given on: General Risks, I had my Procedure. Discharge Instructions, Post Sedation.  Post Procedure Instructions:  The drugs you were given will stay in your system until tomorrow, so for the next 24 hours you should not drive, make any legal decisions or drink any alcoholic beverages.  You may eat anything you prefer, but it is better to start with liquids then soups and crackers, and gradually work up to solid foods.  Please notify your doctor immediately if you have any unusual bleeding, trouble breathing or pain that is not related to your normal pain.  Depending on the type of procedure that was done, some parts of your body may feel week and/or numb.  This usually clears up by tonight or the next day.  Walk with the use of an assistive device or accompanied by an adult for the 24 hours.  You may use ice on the affected area for the first 24  hours.  Put ice in a Ziploc bag and cover with a towel and place against area 15 minutes on 15 minutes off.  You may switch to heat after 24 hours.  A prescription for cipro was sent to your pharmacy and should be available for pickup today.

## 2015-06-02 NOTE — Progress Notes (Signed)
Safety precautions to be maintained throughout the outpatient stay will include: orient to surroundings, keep bed in low position, maintain call bell within reach at all times, provide assistance with transfer out of bed and ambulation.  

## 2015-06-03 ENCOUNTER — Telehealth: Payer: Self-pay | Admitting: *Deleted

## 2015-06-03 NOTE — Telephone Encounter (Signed)
Patient verbalizes no complications from procedure on yesterday. 

## 2015-06-04 ENCOUNTER — Other Ambulatory Visit: Payer: Self-pay | Admitting: Pain Medicine

## 2015-06-08 ENCOUNTER — Other Ambulatory Visit: Payer: Self-pay | Admitting: Pain Medicine

## 2015-06-09 ENCOUNTER — Ambulatory Visit: Payer: No Typology Code available for payment source | Admitting: Pain Medicine

## 2015-06-29 ENCOUNTER — Encounter: Payer: Self-pay | Admitting: Pain Medicine

## 2015-06-29 ENCOUNTER — Ambulatory Visit: Payer: BLUE CROSS/BLUE SHIELD | Attending: Pain Medicine | Admitting: Pain Medicine

## 2015-06-29 VITALS — BP 131/77 | HR 103 | Temp 98.9°F | Resp 15 | Ht 66.0 in | Wt 130.0 lb

## 2015-06-29 DIAGNOSIS — M5126 Other intervertebral disc displacement, lumbar region: Secondary | ICD-10-CM | POA: Insufficient documentation

## 2015-06-29 DIAGNOSIS — M5136 Other intervertebral disc degeneration, lumbar region: Secondary | ICD-10-CM | POA: Diagnosis not present

## 2015-06-29 DIAGNOSIS — M19011 Primary osteoarthritis, right shoulder: Secondary | ICD-10-CM

## 2015-06-29 DIAGNOSIS — M47816 Spondylosis without myelopathy or radiculopathy, lumbar region: Secondary | ICD-10-CM

## 2015-06-29 DIAGNOSIS — M533 Sacrococcygeal disorders, not elsewhere classified: Secondary | ICD-10-CM | POA: Diagnosis not present

## 2015-06-29 DIAGNOSIS — M5030 Other cervical disc degeneration, unspecified cervical region: Secondary | ICD-10-CM

## 2015-06-29 DIAGNOSIS — M48062 Spinal stenosis, lumbar region with neurogenic claudication: Secondary | ICD-10-CM

## 2015-06-29 DIAGNOSIS — M546 Pain in thoracic spine: Secondary | ICD-10-CM | POA: Diagnosis present

## 2015-06-29 DIAGNOSIS — M542 Cervicalgia: Secondary | ICD-10-CM | POA: Diagnosis present

## 2015-06-29 DIAGNOSIS — M4806 Spinal stenosis, lumbar region: Secondary | ICD-10-CM | POA: Diagnosis not present

## 2015-06-29 DIAGNOSIS — M47812 Spondylosis without myelopathy or radiculopathy, cervical region: Secondary | ICD-10-CM

## 2015-06-29 DIAGNOSIS — Z9889 Other specified postprocedural states: Secondary | ICD-10-CM

## 2015-06-29 MED ORDER — HYDROCODONE-ACETAMINOPHEN 10-325 MG PO TABS: ORAL_TABLET | ORAL | Status: DC

## 2015-06-29 NOTE — Progress Notes (Signed)
   Subjective:    Patient ID: Katrina Holmes, female    DOB: [birth year], 58 y.o.   MRN: [id number]  HPI    Review of Systems     Objective:   Physical Exam        Assessment & Plan:  PLAN   Continue present medications and begin hydrocodone acetaminophen. STOP OXYCODONE AND please NOTE   hydrocodone acetaminophen can cause respiratory depression, excessive sedation, confusion, and other side effects. Exercise efforts to avoid the side effects   F/U PCP Dr. Delton PrairiePaul Tobin for evaliation of  BP and general medical  condition  F/U surgical evaluation. May consider pending follow-up evaluations  F/U neurological evaluation. May consider pending follow-up evaluations  May consider radiofrequency rhizolysis or intraspinal procedures pending response to present treatment and F/U evaluation   Patient to call Pain Management Center should patient have concerns prior to scheduled return appointment

## 2015-06-29 NOTE — Progress Notes (Signed)
Safety precautions to be maintained throughout the outpatient stay will include: orient to surroundings, keep bed in low position, maintain call bell within reach at all times, provide assistance with transfer out of bed and ambulation.  

## 2015-06-29 NOTE — Progress Notes (Signed)
   Subjective:    Patient ID: Katrina Holmes, female    DOB: September 20, 1955, 60 y.o.   MRN: [id number]  HPI Patient is 60 year old female who returns to pain management for further evaluation and treatment of pain involving the neck entire back upper and lower extremity regions. The patient has had improvement with prior interventional treatment in pain management Center. On today's visit we discussed patient's medications and we will prescribe medications as discussed. We will remain available to consider interventional treatment as discussed with patient once patient has obtained insurance coverage which will allow such treatment. The patient was understanding and agreed to suggested treatment plan. The patient denies trauma change in events of daily living the cost change in symptoms pathology.   Review of Systems     Objective:   Physical Exam  There was mild tenderness of the splenius capitis and a separate talus musculature regions. Palpation of the acromioclavicular and glenohumeral joint regions reproduce mild discomfort. Palpation over the region of the thoracic facet thoracic paraspinal must reason was with mild discomfort. There was tends to palpation of the acromioclavicular and glenohumeral joint regions of mild degree. Patient appeared to be with decreased grip strength with Tinel and Phalen's maneuver reproducing mild to moderate discomfort. Lumbar paraspinal muscles lumbar facet region associated with moderate tends to palpation with lateral bending rotation extension and palpation of the lumbar facets reproducing moderate discomfort. There was moderate tenderness of the PSIS and PII S region as well as the gluteal and piriformis musculature regions. Straight leg raising limited to possibly 20 without increased pain with dorsiflexion noted. Lateral bending rotation extension and palpation of the lumbar facets reproduce moderate discomfort. There was tends to palpation over the region of the  greater trochanteric region iliotibial band region of minimal degree. No sensory deficit or dermatomal dystrophy detected. DTRs were difficult to elicit appeared to be trace at the knees. Negative clonus negative Homans. Abdomen nontender with no costovertebral tenderness noted.    Assessment & Plan:     Degenerative disc disease lumbar spine L3 -4 broad-based posterior disc bulge with thickening of the ligamentum flavum L4-5 posterior disc bulge facet arthropathy and thickening of the ligamentum flavum resulting in moderate canal stenosis and moderate neural foraminal narrowing. L5-S1 broad-based disc bulge posteriorly thickening of the ligamentum  flavum resulting in spinal stenosis and moderate bilateral neural foraminal stenosis  Lumbar stenosis with neurogenic claudication  Lumbar facet syndrome  Sacroiliac joint dysfunction

## 2015-06-29 NOTE — Patient Instructions (Addendum)
PLAN   Continue present medications and begin hydrocodone acetaminophen. STOP OXYCODONE AND please NOTE   hydrocodone acetaminophen can cause respiratory depression, excessive sedation, confusion, and other side effects. Exercise efforts to avoid the side effects   F/U PCP Dr. Delton Prairie for evaliation of  BP and general medical  condition  F/U surgical evaluation. May consider pending follow-up evaluations  F/U neurological evaluation. May consider pending follow-up evaluations  May consider radiofrequency rhizolysis or intraspinal procedures pending response to present treatment and F/U evaluation   Patient to call Pain Management Center should patient have concerns prior to scheduled return appointment.GENERAL RISKS AND COMPLICATIONS  What are the risk, side effects and possible complications? Generally speaking, most procedures are safe.  However, with any procedure there are risks, side effects, and the possibility of complications.  The risks and complications are dependent upon the sites that are lesioned, or the type of nerve block to be performed.  The closer the procedure is to the spine, the more serious the risks are.  Great care is taken when placing the radio frequency needles, block needles or lesioning probes, but sometimes complications can occur. 1. Infection: Any time there is an injection through the skin, there is a risk of infection.  This is why sterile conditions are used for these blocks.  There are four possible types of infection. 1. Localized skin infection. 2. Central Nervous System Infection-This can be in the form of Meningitis, which can be deadly. 3. Epidural Infections-This can be in the form of an epidural abscess, which can cause pressure inside of the spine, causing compression of the spinal cord with subsequent paralysis. This would require an emergency surgery to decompress, and there are no guarantees that the patient would recover from the  paralysis. 4. Discitis-This is an infection of the intervertebral discs.  It occurs in about 1% of discography procedures.  It is difficult to treat and it may lead to surgery.        2. Pain: the needles have to go through skin and soft tissues, will cause soreness.       3. Damage to internal structures:  The nerves to be lesioned may be near blood vessels or    other nerves which can be potentially damaged.       4. Bleeding: Bleeding is more common if the patient is taking blood thinners such as  aspirin, Coumadin, Ticiid, Plavix, etc., or if he/she have some genetic predisposition  such as hemophilia. Bleeding into the spinal canal can cause compression of the spinal  cord with subsequent paralysis.  This would require an emergency surgery to  decompress and there are no guarantees that the patient would recover from the  paralysis.       5. Pneumothorax:  Puncturing of a lung is a possibility, every time a needle is introduced in  the area of the chest or upper back.  Pneumothorax refers to free air around the  collapsed lung(s), inside of the thoracic cavity (chest cavity).  Another two possible  complications related to a similar event would include: Hemothorax and Chylothorax.   These are variations of the Pneumothorax, where instead of air around the collapsed  lung(s), you may have blood or chyle, respectively.       6. Spinal headaches: They may occur with any procedures in the area of the spine.       7. Persistent CSF (Cerebro-Spinal Fluid) leakage: This is a rare problem, but may occur  with  prolonged intrathecal or epidural catheters either due to the formation of a fistulous  track or a dural tear.       8. Nerve damage: By working so close to the spinal cord, there is always a possibility of  nerve damage, which could be as serious as a permanent spinal cord injury with  paralysis.       9. Death:  Although rare, severe deadly allergic reactions known as "Anaphylactic  reaction" can  occur to any of the medications used.      10. Worsening of the symptoms:  We can always make thing worse.  What are the chances of something like this happening? Chances of any of this occuring are extremely low.  By statistics, you have more of a chance of getting killed in a motor vehicle accident: while driving to the hospital than any of the above occurring .  Nevertheless, you should be aware that they are possibilities.  In general, it is similar to taking a shower.  Everybody knows that you can slip, hit your head and get killed.  Does that mean that you should not shower again?  Nevertheless always keep in mind that statistics do not mean anything if you happen to be on the wrong side of them.  Even if a procedure has a 1 (one) in a 1,000,000 (million) chance of going wrong, it you happen to be that one..Also, keep in mind that by statistics, you have more of a chance of having something go wrong when taking medications.  Who should not have this procedure? If you are on a blood thinning medication (e.g. Coumadin, Plavix, see list of "Blood Thinners"), or if you have an active infection going on, you should not have the procedure.  If you are taking any blood thinners, please inform your physician.  How should I prepare for this procedure?  Do not eat or drink anything at least six hours prior to the procedure.  Bring a driver with you .  It cannot be a taxi.  Come accompanied by an adult that can drive you back, and that is strong enough to help you if your legs get weak or numb from the local anesthetic.  Take all of your medicines the morning of the procedure with just enough water to swallow them.  If you have diabetes, make sure that you are scheduled to have your procedure done first thing in the morning, whenever possible.  If you have diabetes, take only half of your insulin dose and notify our nurse that you have done so as soon as you arrive at the clinic.  If you are  diabetic, but only take blood sugar pills (oral hypoglycemic), then do not take them on the morning of your procedure.  You may take them after you have had the procedure.  Do not take aspirin or any aspirin-containing medications, at least eleven (11) days prior to the procedure.  They may prolong bleeding.  Wear loose fitting clothing that may be easy to take off and that you would not mind if it got stained with Betadine or blood.  Do not wear any jewelry or perfume  Remove any nail coloring.  It will interfere with some of our monitoring equipment.  NOTE: Remember that this is not meant to be interpreted as a complete list of all possible complications.  Unforeseen problems may occur.  BLOOD THINNERS The following drugs contain aspirin or other products, which can cause increased bleeding during surgery and  should not be taken for 2 weeks prior to and 1 week after surgery.  If you should need take something for relief of minor pain, you may take acetaminophen which is found in Tylenol,m Datril, Anacin-3 and Panadol. It is not blood thinner. The products listed below are.  Do not take any of the products listed below in addition to any listed on your instruction sheet.  A.P.C or A.P.C with Codeine Codeine Phosphate Capsules #3 Ibuprofen Ridaura  ABC compound Congesprin Imuran rimadil  Advil Cope Indocin Robaxisal  Alka-Seltzer Effervescent Pain Reliever and Antacid Coricidin or Coricidin-D  Indomethacin Rufen  Alka-Seltzer plus Cold Medicine Cosprin Ketoprofen S-A-C Tablets  Anacin Analgesic Tablets or Capsules Coumadin Korlgesic Salflex  Anacin Extra Strength Analgesic tablets or capsules CP-2 Tablets Lanoril Salicylate  Anaprox Cuprimine Capsules Levenox Salocol  Anexsia-D Dalteparin Magan Salsalate  Anodynos Darvon compound Magnesium Salicylate Sine-off  Ansaid Dasin Capsules Magsal Sodium Salicylate  Anturane Depen Capsules Marnal Soma  APF Arthritis pain formula Dewitt's Pills  Measurin Stanback  Argesic Dia-Gesic Meclofenamic Sulfinpyrazone  Arthritis Bayer Timed Release Aspirin Diclofenac Meclomen Sulindac  Arthritis pain formula Anacin Dicumarol Medipren Supac  Analgesic (Safety coated) Arthralgen Diffunasal Mefanamic Suprofen  Arthritis Strength Bufferin Dihydrocodeine Mepro Compound Suprol  Arthropan liquid Dopirydamole Methcarbomol with Aspirin Synalgos  ASA tablets/Enseals Disalcid Micrainin Tagament  Ascriptin Doan's Midol Talwin  Ascriptin A/D Dolene Mobidin Tanderil  Ascriptin Extra Strength Dolobid Moblgesic Ticlid  Ascriptin with Codeine Doloprin or Doloprin with Codeine Momentum Tolectin  Asperbuf Duoprin Mono-gesic Trendar  Aspergum Duradyne Motrin or Motrin IB Triminicin  Aspirin plain, buffered or enteric coated Durasal Myochrisine Trigesic  Aspirin Suppositories Easprin Nalfon Trillsate  Aspirin with Codeine Ecotrin Regular or Extra Strength Naprosyn Uracel  Atromid-S Efficin Naproxen Ursinus  Auranofin Capsules Elmiron Neocylate Vanquish  Axotal Emagrin Norgesic Verin  Azathioprine Empirin or Empirin with Codeine Normiflo Vitamin E  Azolid Emprazil Nuprin Voltaren  Bayer Aspirin plain, buffered or children's or timed BC Tablets or powders Encaprin Orgaran Warfarin Sodium  Buff-a-Comp Enoxaparin Orudis Zorpin  Buff-a-Comp with Codeine Equegesic Os-Cal-Gesic   Buffaprin Excedrin plain, buffered or Extra Strength Oxalid   Bufferin Arthritis Strength Feldene Oxphenbutazone   Bufferin plain or Extra Strength Feldene Capsules Oxycodone with Aspirin   Bufferin with Codeine Fenoprofen Fenoprofen Pabalate or Pabalate-SF   Buffets II Flogesic Panagesic   Buffinol plain or Extra Strength Florinal or Florinal with Codeine Panwarfarin   Buf-Tabs Flurbiprofen Penicillamine   Butalbital Compound Four-way cold tablets Penicillin   Butazolidin Fragmin Pepto-Bismol   Carbenicillin Geminisyn Percodan   Carna Arthritis Reliever Geopen Persantine    Carprofen Gold's salt Persistin   Chloramphenicol Goody's Phenylbutazone   Chloromycetin Haltrain Piroxlcam   Clmetidine heparin Plaquenil   Cllnoril Hyco-pap Ponstel   Clofibrate Hydroxy chloroquine Propoxyphen         Before stopping any of these medications, be sure to consult the physician who ordered them.  Some, such as Coumadin (Warfarin) are ordered to prevent or treat serious conditions such as "deep thrombosis", "pumonary embolisms", and other heart problems.  The amount of time that you may need off of the medication may also vary with the medication and the reason for which you were taking it.  If you are taking any of these medications, please make sure you notify your pain physician before you undergo any procedures.

## 2015-07-01 ENCOUNTER — Encounter: Payer: Self-pay | Admitting: Pain Medicine

## 2015-07-28 ENCOUNTER — Ambulatory Visit: Payer: BLUE CROSS/BLUE SHIELD | Attending: Pain Medicine | Admitting: Pain Medicine

## 2015-07-28 ENCOUNTER — Encounter: Payer: Self-pay | Admitting: Pain Medicine

## 2015-07-28 VITALS — BP 140/87 | HR 96 | Temp 97.5°F | Resp 18 | Ht 66.0 in | Wt 135.0 lb

## 2015-07-28 DIAGNOSIS — M533 Sacrococcygeal disorders, not elsewhere classified: Secondary | ICD-10-CM | POA: Insufficient documentation

## 2015-07-28 DIAGNOSIS — M4806 Spinal stenosis, lumbar region: Secondary | ICD-10-CM | POA: Diagnosis not present

## 2015-07-28 DIAGNOSIS — M5136 Other intervertebral disc degeneration, lumbar region: Secondary | ICD-10-CM | POA: Diagnosis not present

## 2015-07-28 DIAGNOSIS — M47816 Spondylosis without myelopathy or radiculopathy, lumbar region: Secondary | ICD-10-CM

## 2015-07-28 DIAGNOSIS — M545 Low back pain: Secondary | ICD-10-CM | POA: Diagnosis present

## 2015-07-28 DIAGNOSIS — M5126 Other intervertebral disc displacement, lumbar region: Secondary | ICD-10-CM | POA: Insufficient documentation

## 2015-07-28 DIAGNOSIS — M19011 Primary osteoarthritis, right shoulder: Secondary | ICD-10-CM

## 2015-07-28 DIAGNOSIS — M48062 Spinal stenosis, lumbar region with neurogenic claudication: Secondary | ICD-10-CM

## 2015-07-28 DIAGNOSIS — M47812 Spondylosis without myelopathy or radiculopathy, cervical region: Secondary | ICD-10-CM

## 2015-07-28 DIAGNOSIS — Z9889 Other specified postprocedural states: Secondary | ICD-10-CM

## 2015-07-28 DIAGNOSIS — M5030 Other cervical disc degeneration, unspecified cervical region: Secondary | ICD-10-CM

## 2015-07-28 DIAGNOSIS — M546 Pain in thoracic spine: Secondary | ICD-10-CM | POA: Diagnosis present

## 2015-07-28 MED ORDER — OXYCODONE HCL 5 MG PO TABS: ORAL_TABLET | ORAL | Status: DC

## 2015-07-28 NOTE — Progress Notes (Signed)
Safety precautions to be maintained throughout the outpatient stay will include: orient to surroundings, keep bed in low position, maintain call bell within reach at all times, provide assistance with transfer out of bed and ambulation.  

## 2015-07-28 NOTE — Progress Notes (Signed)
Subjective:    Patient ID: Katrina Holmes, female    DOB: 01-24-1956, 60 y.o.   MRN: [id number]  HPI  The patient is a 60 year old female who returns to pain management for further evaluation and treatment of pain involving the upper mid lower back and lower extremity region with pain occurring in the region of the neck as well. The patient is with severely disabling pain interfering with ability to perform activities of daily living to significant degree. On today's evaluation the patient was very cautious with maneuvering and was with minimal ability to perform rotation flexion extension maneuvers. We discussed patient's condition and will continue medication as prescribed at this time and will consider patient for lumbar facet, medial branch nerve blocks to be performed at time of return appointment. We discuss surgical reevaluation pending neurological evaluation and will consider patient for further evaluations as discussed. We will continue medication as prescribed at this time and will proceed with lumbar facet, medial branch nerve, blocks to be performed at time return appointment in attempt to decrease severity of patient's symptoms, minimize progression of patient's symptoms, and avoid the need for more involved treatment. The patient was understanding and in agreement with suggested treatment plan.     Review of Systems     Objective:   Physical Exam There was tenderness to palpation of paraspinal muscular treat the cervical region cervical facet region palpation which be produced pain of moderate degree with moderate tenderness over the splenius capitis and occipitalis musculature regions. There was moderate tenderness over the thoracic facet and thoracic paraspinal musculature region. Palpation of the acromioclavicular and glenohumeral joint regions reproduce mild to moderate discomfort and patient was able to perform drop test with mild difficulty. There was tenderness over the  thoracic facet thoracic paraspinal musculature region a moderate region with moderate muscle spasms of the thoracic region noted upper mid and lower thoracic regions. The patient was with severe increase of pain with palpation over the lumbar facets with rotation and palpation over the lumbar facets reproducing severe disabling pain. There was severe tenderness of the PSIS and PII S region as well as the gluteal and piriformis musculature regions with straight leg raising tolerates approximately 20 without a definite increase of pain with dorsiflexion noted. EHL strength appeared to be questioning decreased. There was negative clonus negative Homans. Palpation of the greater trochanteric region and iliotibial band region reproduced mild discomfort. Abdomen nontender with no costovertebral tenderness noted       Assessment & Plan:    Degenerative disc disease lumbar spine L3 -4 broad-based posterior disc bulge with thickening of the ligamentum flavum L4-5 posterior disc bulge facet arthropathy and thickening of the ligamentum flavum resulting in moderate canal stenosis and moderate neural foraminal narrowing. L5-S1 broad-based disc bulge posteriorly thickening of the ligamentum  flavum resulting in spinal stenosis and moderate bilateral neural foraminal stenosis  Lumbar stenosis with neurogenic claudication  Lumbar facet syndrome  Sacroiliac joint dysfunction     PLAN   Continue present medications and resume oxycodone STOP HYDROCODONE ACETAMINOPHEN  Oxycodone can cause respiratory depression, excessive sedation, confusion, and other side effects. Exercise efforts to avoid the side effects  Lumbar facet, medial branch nerve, blocks to be performed at time return appointment   F/U PCP Dr. Delton Prairie for evaliation of  BP and general medical  condition  F/U surgical evaluation. May consider pending follow-up evaluations  F/U neurological evaluation. May consider pending follow-up  evaluations  May consider radiofrequency rhizolysis or  intraspinal procedures pending response to present treatment and F/U evaluation   Patient to call Pain Management Center should patient have concerns prior to scheduled return appointment.

## 2015-07-28 NOTE — Patient Instructions (Addendum)
PLAN   Continue present medications and resume oxycodone STOP HYDROCODONE ACETAMINOPHEN  Oxycodone can cause respiratory depression, excessive sedation, confusion, and other side effects. Exercise efforts to avoid the side effects  Lumbar facet, medial branch nerve, blocks to be performed at time return appointment   F/U PCP Dr. Delton Prairie for evaliation of  BP and general medical  condition  F/U surgical evaluation. May consider pending follow-up evaluations  F/U neurological evaluation. May consider pending follow-up evaluations  May consider radiofrequency rhizolysis or intraspinal procedures pending response to present treatment and F/U evaluation   Patient to call Pain Management Center should patient have concerns prior to scheduled return appointment.Facet Blocks Patient Information  Description: The facets are joints in the spine between the vertebrae.  Like any joints in the body, facets can become irritated and painful.  Arthritis can also effect the facets.  By injecting steroids and local anesthetic in and around these joints, we can temporarily block the nerve supply to them.  Steroids act directly on irritated nerves and tissues to reduce selling and inflammation which often leads to decreased pain.  Facet blocks may be done anywhere along the spine from the neck to the low back depending upon the location of your pain.   After numbing the skin with local anesthetic (like Novocaine), a small needle is passed onto the facet joints under x-ray guidance.  You may experience a sensation of pressure while this is being done.  The entire block usually lasts about 15-25 minutes.   Conditions which may be treated by facet blocks:   Low back/buttock pain  Neck/shoulder pain  Certain types of headaches  Preparation for the injection:  1. Do not eat any solid food or dairy products within 6 hours of your appointment. 2. You may drink clear liquid up to 2 hours before appointment.   Clear liquids include water, black coffee, juice or soda.  No milk or cream please. 3. You may take your regular medication, including pain medications, with a sip of water before your appointment.  Diabetics should hold regular insulin (if taken separately) and take 1/2 normal NPH dose the morning of the procedure.  Carry some sugar containing items with you to your appointment. 4. A driver must accompany you and be prepared to drive you home after your procedure. 5. Bring all your current medications with you. 6. An IV may be inserted and sedation may be given at the discretion of the physician. 7. A blood pressure cuff, EKG and other monitors will often be applied during the procedure.  Some patients may need to have extra oxygen administered for a short period. 8. You will be asked to provide medical information, including your allergies and medications, prior to the procedure.  We must know immediately if you are taking blood thinners (like Coumadin/Warfarin) or if you are allergic to IV iodine contrast (dye).  We must know if you could possible be pregnant.  Possible side-effects:   Bleeding from needle site  Infection (rare, may require surgery)  Nerve injury (rare)  Numbness & tingling (temporary)  Difficulty urinating (rare, temporary)  Spinal headache (a headache worse with upright posture)  Light-headedness (temporary)  Pain at injection site (serveral days)  Decreased blood pressure (rare, temporary)  Weakness in arm/leg (temporary)  Pressure sensation in back/neck (temporary)   Call if you experience:   Fever/chills associated with headache or increased back/neck pain  Headache worsened by an upright position  New onset, weakness or numbness of an  extremity below the injection site  Hives or difficulty breathing (go to the emergency room)  Inflammation or drainage at the injection site(s)  Severe back/neck pain greater than usual  New symptoms which are  concerning to you  Please note:  Although the local anesthetic injected can often make your back or neck feel good for several hours after the injection, the pain will likely return. It takes 3-7 days for steroids to work.  You may not notice any pain relief for at least one week.  If effective, we will often do a series of 2-3 injections spaced 3-6 weeks apart to maximally decrease your pain.  After the initial series, you may be a candidate for a more permanent nerve block of the facets.  If you have any questions, please call #[phone] [facility]

## 2015-07-29 ENCOUNTER — Ambulatory Visit: Payer: BLUE CROSS/BLUE SHIELD | Admitting: Pain Medicine

## 2015-08-09 ENCOUNTER — Ambulatory Visit: Payer: BLUE CROSS/BLUE SHIELD | Attending: Pain Medicine | Admitting: Pain Medicine

## 2015-08-09 ENCOUNTER — Encounter: Payer: Self-pay | Admitting: Pain Medicine

## 2015-08-09 VITALS — BP 121/67 | HR 80 | Temp 98.0°F | Resp 16

## 2015-08-09 DIAGNOSIS — M5030 Other cervical disc degeneration, unspecified cervical region: Secondary | ICD-10-CM

## 2015-08-09 DIAGNOSIS — M47816 Spondylosis without myelopathy or radiculopathy, lumbar region: Secondary | ICD-10-CM

## 2015-08-09 DIAGNOSIS — M79606 Pain in leg, unspecified: Secondary | ICD-10-CM | POA: Diagnosis present

## 2015-08-09 DIAGNOSIS — M5136 Other intervertebral disc degeneration, lumbar region: Secondary | ICD-10-CM | POA: Insufficient documentation

## 2015-08-09 DIAGNOSIS — M545 Low back pain: Secondary | ICD-10-CM | POA: Diagnosis present

## 2015-08-09 DIAGNOSIS — M4806 Spinal stenosis, lumbar region: Secondary | ICD-10-CM | POA: Diagnosis not present

## 2015-08-09 DIAGNOSIS — M1288 Other specific arthropathies, not elsewhere classified, other specified site: Secondary | ICD-10-CM | POA: Insufficient documentation

## 2015-08-09 DIAGNOSIS — M5126 Other intervertebral disc displacement, lumbar region: Secondary | ICD-10-CM | POA: Diagnosis not present

## 2015-08-09 DIAGNOSIS — M48062 Spinal stenosis, lumbar region with neurogenic claudication: Secondary | ICD-10-CM

## 2015-08-09 DIAGNOSIS — Z9889 Other specified postprocedural states: Secondary | ICD-10-CM

## 2015-08-09 DIAGNOSIS — M19011 Primary osteoarthritis, right shoulder: Secondary | ICD-10-CM

## 2015-08-09 DIAGNOSIS — M47812 Spondylosis without myelopathy or radiculopathy, cervical region: Secondary | ICD-10-CM

## 2015-08-09 DIAGNOSIS — M51369 Other intervertebral disc degeneration, lumbar region without mention of lumbar back pain or lower extremity pain: Secondary | ICD-10-CM

## 2015-08-09 MED ORDER — MIDAZOLAM HCL 5 MG/5ML IJ SOLN
5.0000 mg | Freq: Once | INTRAMUSCULAR | Status: AC
  Administered 2015-08-09: 5 mg via INTRAVENOUS

## 2015-08-09 MED ORDER — TRIAMCINOLONE ACETONIDE 40 MG/ML IJ SUSP
INTRAMUSCULAR | Status: AC
  Administered 2015-08-09: 40 mg
  Filled 2015-08-09: qty 1

## 2015-08-09 MED ORDER — BUPIVACAINE HCL (PF) 0.25 % IJ SOLN
30.0000 mL | Freq: Once | INTRAMUSCULAR | Status: AC
  Administered 2015-08-09: 30 mL

## 2015-08-09 MED ORDER — LACTATED RINGERS IV SOLN
1000.0000 mL | INTRAVENOUS | Status: DC
Start: 2015-08-09 — End: 2015-08-16

## 2015-08-09 MED ORDER — FENTANYL CITRATE (PF) 100 MCG/2ML IJ SOLN
100.0000 ug | Freq: Once | INTRAMUSCULAR | Status: AC
  Administered 2015-08-09: 100 ug via INTRAVENOUS

## 2015-08-09 MED ORDER — TRIAMCINOLONE ACETONIDE 40 MG/ML IJ SUSP
40.0000 mg | Freq: Once | INTRAMUSCULAR | Status: AC
  Administered 2015-08-09: 40 mg

## 2015-08-09 MED ORDER — BUPIVACAINE HCL (PF) 0.25 % IJ SOLN
INTRAMUSCULAR | Status: AC
  Administered 2015-08-09: 30 mL
  Filled 2015-08-09: qty 30

## 2015-08-09 MED ORDER — MIDAZOLAM HCL 5 MG/5ML IJ SOLN
INTRAMUSCULAR | Status: AC
Start: 2015-08-09 — End: 2015-08-09
  Administered 2015-08-09: 5 mg via INTRAVENOUS
  Filled 2015-08-09: qty 5

## 2015-08-09 MED ORDER — CIPROFLOXACIN HCL 250 MG PO TABS: 250.0000 mg | ORAL_TABLET | Freq: Two times a day (BID) | ORAL | Status: DC

## 2015-08-09 MED ORDER — CIPROFLOXACIN IN D5W 400 MG/200ML IV SOLN
INTRAVENOUS | Status: AC
  Administered 2015-08-09: 400 mg via INTRAVENOUS
  Filled 2015-08-09: qty 200

## 2015-08-09 MED ORDER — CIPROFLOXACIN IN D5W 400 MG/200ML IV SOLN
400.0000 mg | Freq: Once | INTRAVENOUS | Status: AC
  Administered 2015-08-09: 400 mg via INTRAVENOUS

## 2015-08-09 MED ORDER — FENTANYL CITRATE (PF) 100 MCG/2ML IJ SOLN
INTRAMUSCULAR | Status: AC
  Administered 2015-08-09: 100 ug via INTRAVENOUS
  Filled 2015-08-09: qty 2

## 2015-08-09 MED ORDER — LIDOCAINE HCL (PF) 1 % IJ SOLN: 10.0000 mL | Freq: Once | INTRAMUSCULAR | Status: DC

## 2015-08-09 MED ORDER — ORPHENADRINE CITRATE 30 MG/ML IJ SOLN
60.0000 mg | Freq: Once | INTRAMUSCULAR | Status: AC
  Administered 2015-08-09: 60 mg via INTRAMUSCULAR

## 2015-08-09 MED ORDER — ORPHENADRINE CITRATE 30 MG/ML IJ SOLN
INTRAMUSCULAR | Status: AC
  Administered 2015-08-09: 60 mg via INTRAMUSCULAR
  Filled 2015-08-09: qty 2

## 2015-08-09 NOTE — Patient Instructions (Addendum)
Continue present medications. Begin taking Cipro antibiotic today as prescribed  F/U PCP Dr. Delton Prairie for evaliation of  BP and general medical  condition  F/U surgical evaluation. May consider pending follow-up evaluations  F/U neurological evaluation. May consider pending follow-up evaluations  May consider radiofrequency rhizolysis or intraspinal procedures pending response to present treatment and F/U evaluation   Patient to call Pain Management Center should patient have concerns prior to scheduled return appointment.  Pain Management Discharge Instructions  General Discharge Instructions :  If you need to reach your doctor call: Monday-Friday 8:00 am - 4:00 pm at [phone] or toll free [phone].  After clinic hours [phone] to have operator reach doctor.  Bring all of your medication bottles to all your appointments in the pain clinic.  To cancel or reschedule your appointment with Pain Management please remember to call 24 hours in advance to avoid a fee.  Refer to the educational materials which you have been given on: General Risks, I had my Procedure. Discharge Instructions, Post Sedation.  Post Procedure Instructions:  The drugs you were given will stay in your system until tomorrow, so for the next 24 hours you should not drive, make any legal decisions or drink any alcoholic beverages.  You may eat anything you prefer, but it is better to start with liquids then soups and crackers, and gradually work up to solid foods.  Please notify your doctor immediately if you have any unusual bleeding, trouble breathing or pain that is not related to your normal pain.  Depending on the type of procedure that was done, some parts of your body may feel week and/or numb.  This usually clears up by tonight or the next day.  Walk with the use of an assistive device or accompanied by an adult for the 24 hours.  You may use ice on the affected area for the first 24 hours.   Put ice in a Ziploc bag and cover with a towel and place against area 15 minutes on 15 minutes off.  You may switch to heat after 24 hours.  A prescription for Cipro was sent to your pharmacy and should be available for pickup today.

## 2015-08-09 NOTE — Progress Notes (Signed)
Subjective:    Patient ID: Katrina Holmes, female    DOB: 19-Apr-1956, 60 y.o.   MRN: [id number]  HPI PROCEDURE PERFORMED: Lumbar facet (medial branch block)   NOTE: The patient is a 60 y.o. female who [facility] for further evaluation and treatment of pain involving the lumbar and lower extremity region. MRI  revealed the patient to be with evidence of Degenerative disc disease lumbar spine L3 -4 broad-based posterior disc bulge with thickening of the ligamentum flavum L4-5 posterior disc bulge facet arthropathy and thickening of the ligamentum flavum resulting in moderate canal stenosis and moderate neural foraminal narrowing. L5-S1 broad-based disc bulge posteriorly thickening of the ligamentum  flavum resulting in spinal stenosis and moderate bilateral neural foraminal stenosis there is concern regarding significant component of pain due to facet syndrome. The risks, benefits, and expectations of the procedure have been discussed and explained to the patient who was understanding and in agreement with suggested treatment plan. We will proceed with interventional treatment as discussed and as explained to the patient who was understanding and wished to proceed with procedure as planned.   DESCRIPTION OF PROCEDURE: Lumbar facet (medial branch block) with IV Versed, IV fentanyl conscious sedation, EKG, blood pressure, pulse, and pulse oximetry monitoring. The procedure was performed with the patient in the prone position. Betadine prep of proposed entry site performed.   NEEDLE PLACEMENT AT: Left L 2 lumbar facet (medial branch block). Under fluoroscopic guidance with oblique orientation of 15 degrees, a 22-gauge needle was inserted at the L 2 vertebral body level with needle placed at the targeted area of Burton's Eye or Eye of the Scotty Dog with documentation of needle placement in the superior and lateral border of targeted area of Burton's Eye or Eye of the Scotty Dog with  oblique orientation of 15 degrees. Following documentation of needle placement at the L 2 vertebral body level, needle placement was then accomplished at the L 3 vertebral body level.   NEEDLE PLACEMENT AT L3, L4, and L5 VERTEBRAL BODY LEVELS ON THE LEFT SIDE The procedure was performed at the L3, L4, and L5 vertebral body levels exactly as was performed at the L 2 vertebral body level utilizing the same technique and under fluoroscopic guidance.  NEEDLE PLACEMENT AT THE SACRAL ALA with AP view of the lumbosacral spine. With the patient in the prone position, Betadine prep of proposed entry site accomplished, a 22 gauge needle was inserted in the region of the sacral ala (groove formed by the superior articulating process of S1 and the sacral wing). Following documentation of needle placement at the sacral ala,    .   Needle placement was then verified at all levels on lateral view. Following documentation of needle placement at all levels on lateral view and following negative aspiration for heme and CSF, each level was injected with 1 mL of 0.25% bupivacaine with Kenalog.     LUMBAR FACET, MEDIAL BRANCH NERVE, BLOCKS PERFORMED ON THE RIGHT SIDE   The procedure was performed on the right side exactly as was performed on the left side at the same levels and utilizing the same technique under fluoroscopic guidance.     The patient tolerated the procedure well. A total of 40 mg of Kenalog was utilized for the procedure.   PLAN:  1. Medications: The patient will continue presently prescribed medication oxycodone. 2. May consider modification of treatment regimen at time of return appointment pending response to treatment rendered on today's  visit. 3. The patient is to follow-up with primary care physician Dr. Delton Prairie for further evaluation of blood pressure and general medical condition status post steroid injection performed on today's visit. 4. Surgical follow-up  evaluation. 5. Neurological follow-up evaluation. 6. The patient may be candidate for radiofrequency procedures, implantation type procedures, and other treatment pending response to treatment and follow-up evaluation. 7. The patient has been advised to call the [facility] prior to scheduled return appointment should there be significant change in condition or should patient have other concerns regarding condition prior to scheduled return appointment.  The patient is understanding and in agreement with suggested treatment plan.    Review of Systems     Objective:   Physical Exam        Assessment & Plan:

## 2015-08-10 ENCOUNTER — Telehealth: Payer: Self-pay | Admitting: *Deleted

## 2015-08-10 NOTE — Telephone Encounter (Signed)
No problems post procedure. 

## 2015-08-12 ENCOUNTER — Telehealth: Payer: Self-pay | Admitting: *Deleted

## 2015-08-12 ENCOUNTER — Telehealth: Payer: Self-pay | Admitting: Pain Medicine

## 2015-08-12 NOTE — Telephone Encounter (Signed)
Unable to tolerate new meds Oxycodone 5 no tylenol, due to severe headache

## 2015-08-12 NOTE — Telephone Encounter (Signed)
Called patient for further details.  Patient states that every time she takes her oxycodone 5 mg it causes her to have a severe headache.  She stopped it a couple of days ago and then tried again last hs @ 1800 and again she had a headache.  She wants to know if Dr Metta Clines can prescribe something different.

## 2015-08-12 NOTE — Telephone Encounter (Signed)
Nurses Please inform patient that we will consider changing her medication at time of her return appointment. Inform the patient that I am must see her for evaluation to change medication. Ask patient if she wishes to schedule an earlier appointment. We will schedule patient for an earlier appointment if she wishes to do such and we we'll discuss changing medications at that time

## 2015-08-12 NOTE — Telephone Encounter (Signed)
Routed to Dr Metta Clines with request to change medication.

## 2015-08-12 NOTE — Telephone Encounter (Signed)
patient called and informed that Dr. Metta Clines must see her for evaluation appointment in order to discuss changing her medications.Per Dr. Metta Clines order: appointment made for March 1st at 0700 per Werner Lean. March 8th appointment cancelled.

## 2015-08-16 ENCOUNTER — Encounter: Payer: Self-pay | Admitting: *Deleted

## 2015-08-16 ENCOUNTER — Emergency Department
Admission: EM | Admit: 2015-08-16 | Discharge: 2015-08-16 | Disposition: A | Discharge status: Home / Self Care | Payer: BLUE CROSS/BLUE SHIELD | Attending: Emergency Medicine | Admitting: Emergency Medicine

## 2015-08-16 DIAGNOSIS — I10 Essential (primary) hypertension: Secondary | ICD-10-CM | POA: Insufficient documentation

## 2015-08-16 DIAGNOSIS — M5126 Other intervertebral disc displacement, lumbar region: Secondary | ICD-10-CM | POA: Diagnosis not present

## 2015-08-16 DIAGNOSIS — Z7982 Long term (current) use of aspirin: Secondary | ICD-10-CM | POA: Insufficient documentation

## 2015-08-16 DIAGNOSIS — Z79899 Other long term (current) drug therapy: Secondary | ICD-10-CM | POA: Insufficient documentation

## 2015-08-16 DIAGNOSIS — M545 Low back pain: Secondary | ICD-10-CM | POA: Diagnosis present

## 2015-08-16 DIAGNOSIS — Z880 Allergy status to penicillin: Secondary | ICD-10-CM | POA: Diagnosis not present

## 2015-08-16 DIAGNOSIS — F17210 Nicotine dependence, cigarettes, uncomplicated: Secondary | ICD-10-CM | POA: Insufficient documentation

## 2015-08-16 DIAGNOSIS — M5136 Other intervertebral disc degeneration, lumbar region: Secondary | ICD-10-CM

## 2015-08-16 MED ORDER — OXYCODONE-ACETAMINOPHEN 10-325 MG PO TABS: 1.0000 | ORAL_TABLET | Freq: Four times a day (QID) | ORAL | Status: DC | PRN

## 2015-08-16 MED ORDER — HYDROMORPHONE HCL 1 MG/ML IJ SOLN
INTRAMUSCULAR | Status: AC
  Filled 2015-08-16: qty 1

## 2015-08-16 MED ORDER — HYDROMORPHONE HCL 1 MG/ML IJ SOLN
1.0000 mg | Freq: Once | INTRAMUSCULAR | Status: AC
  Administered 2015-08-16: 1 mg via INTRAMUSCULAR

## 2015-08-16 MED ORDER — ONDANSETRON 4 MG PO TBDP
ORAL_TABLET | ORAL | Status: AC
  Filled 2015-08-16: qty 1

## 2015-08-16 MED ORDER — ONDANSETRON 4 MG PO TBDP
4.0000 mg | ORAL_TABLET | Freq: Once | ORAL | Status: AC
  Administered 2015-08-16: 4 mg via ORAL

## 2015-08-16 NOTE — ED Notes (Signed)
States she is in pain management for chronic back pain.  States she was rx'd oxycodone by Dr Metta Clines. States she can't tolerate . Gives her a headache  But is able to take percocet

## 2015-08-16 NOTE — ED Notes (Signed)
States she is in pain management and states she was given percocet #5 without tylenol, states she has side effects from the meds and the meds are not helping, states she dosent have an appt with him till 3/1 and can not take the pain, also states headache

## 2015-08-16 NOTE — Discharge Instructions (Signed)
Herniated Disk  A herniated disk occurs when a disk in your spine bulges out too far. This condition is also called a ruptured disk or slipped disk. Your spine (backbone) is made up of bones called vertebrae. Between each pair of vertebrae is an oval disk with a soft, spongy center that acts as a shock absorber when you move. The spongy center is surrounded by a tough outer ring.  When you have a herniated disk, the spongy center of the disk bulges out or ruptures through the outer ring. A herniated disk can press on a nerve between your vertebrae and cause pain. A herniated disk can occur anywhere in your back or neck area, but the lower back is the most common spot.  CAUSES   In many cases, a herniated disk occurs just from getting older. As you age, the spongy insides of your disks tend to shrink and dry out. A herniated disk can result from gradual wear and tear. Injury or sudden strain can also cause a herniated disk.   RISK FACTORS  Aging is the main risk factor for a herniated disk. Other risk factors include:   Being a man between the ages of 30 and 50 years.   Having a job that requires heavy lifting, bending, or twisting.   Having a job that requires long hours of driving.   Not getting enough exercise.   Being overweight.   Smoking.  SIGNS AND SYMPTOMS   Signs and symptoms depend on which disk is herniated.   For a herniated disk in the lower back, you may have sharp pain in:    One part of your leg, hip, or buttocks.    The back of your calf.    The top or sole of your foot (sciatica).    For a herniated disk in the neck, you may feel pain:    When you move your neck.    Near or over your shoulder blade.    That moves to your upper arm, forearm, or fingers.    You may also have muscle weakness. It may be hard to:    Lift your leg or arm.    Stand on your toes.    Squeeze tightly with one of your hands.   Other symptoms can include:    Numbness or tingling in the affected areas of your body.     Loss of bladder or bowel control. This is a rare but serious sign of a severe herniated disk in the lower back.  DIAGNOSIS   Your health care provider will do a physical exam. During this exam, you may have to move certain body parts or assume various positions. For example, your health care provider may do the straight-leg test. This is a good way to test for a herniated disk in your lower back. In this test, the health care provider lifts your leg while you lie on your back. This is to see if you feel pain down your leg. Your health care provider will also check for numbness or loss of feeling.   Your health care provider will also check your:   Reflexes.   Muscle strength.   Posture.   Other tests may be done to help in making a diagnosis. These may include:   An X-ray of the spine to rule out other causes of back pain.    Other imaging studies, such as an MRI or CT scan. This is to check whether the herniated disk is   pressing on your spinal canal.   Electromyography (EMG). This test checks the nerves that control muscles. It is sometimes used to identify the specific area of nerve involvement.   TREATMENT   In many cases, herniated disk symptoms go away over a period of days or weeks. You will most likely be free of symptoms in 3-4 months. Treatment may include the following:   The initial treatment for a herniated disk is ashort period of rest.    Bed rest is often limited to 1 or 2 days. Resting for too long delays recovery.    If you have a herniated disk in your lower back, you should avoid sitting as much as possible because sitting increases pressure on the disk.   Medicines. These may include:     Nonsteroidal anti-inflammatory drugs (NSAIDs).    Muscle relaxants for back spasms.    Narcotic pain medicine if your pain is very bad.    Steroid injections. You may need these along the involved nerve root to help control pain. The steroid is injected in the area of the herniated disk. It  helps by reducing swelling around the disk.   Physical therapy. This may include exercises to strengthen the muscles that help support your spine.    You may need surgery if other treatments do not work.   HOME CARE INSTRUCTIONS  Follow all your health care provider's instructions. These may include:   Take all medicines as directed by your health care provider.   Rest for 2 days and then start moving.   Do not sit or stand for long periods of time.   Maintain good posture when sitting and standing.   Avoid movements that cause pain, such as bending or lifting.   When you are able to start lifting things again:   Bend with your knees.   Keep your back straight.   Hold heavy objects close to your body.   If you are overweight, ask your health care provider to help you start a weight-loss program.   When you are able to start exercising, ask your health care provider how much and what type of exercise is best for you.   Work with a physical therapist on stretching and strengthening exercises for your back.   Do not wear high-heeled shoes.   Do not sleep on your belly.   Do not smoke.   Keep all follow-up visits as directed by your health care provider.  SEEK MEDICAL CARE IF:   You have back or neck pain that is not getting better after 4 weeks.   You have very bad pain in your back or neck.   You develop numbness, tingling, or weakness along with pain.  SEEK IMMEDIATE MEDICAL CARE IF:    You have numbness, tingling, or weakness that makes you unable to use your arms or legs.   You lose control of your bladder or bowels.   You have dizziness or fainting.   You have shortness of breath.   MAKE SURE YOU:    Understand these instructions.   Will watch your condition.   Will get help right away if you are not doing well or get worse.     This information is not intended to replace advice given to you by your health care provider. Make sure you discuss any questions you have with your health  care provider.     Document Released: 06/02/2000 Document Revised: 06/26/2014 Document Reviewed: 05/09/2013  Elsevier Interactive Patient Education   2016 [employer]

## 2015-08-16 NOTE — ED Provider Notes (Signed)
[facility] Emergency Department Provider Note  ____________________________________________  Time seen: Approximately 5:22 PM  I have reviewed the triage vital signs and the nursing notes.   HISTORY  Chief Complaint No chief complaint on file.   HPI Katrina Holmes is a 60 y.o. female complains of severe lower back pain. Patient states she is a patient of Dr. crisp has an appointment in 2 days. She was referred her for pain control to help her until she gets to her next appointment. No relief with Percocet 5 mg by itself causes headaches. Recent medical history significant for 3 bulging disks and 2 cysts in her back. Patient states unable to sleep for the last couple nights and rates her pain as a 10 over 10 nonradiating.   Past Medical History  Diagnosis Date  . Anxiety   . Allergy   . Hypertension     Patient Active Problem List   Diagnosis Date Noted  . DDD (degenerative disc disease), cervical 05/25/2015  . H/O cervical spine surgery 05/25/2015  . Cervical facet syndrome 05/25/2015  . 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 DDD (degenerative disc disease), lumbar 05/25/2015  . Facet syndrome, lumbar 05/25/2015    Past Surgical History  Procedure Laterality Date  . Neck surgery  2006  . Shoulder arthroscopy with rotator cuff repair Left     3 surgeries (2006-2007-2008)  . Abdominal hysterectomy      Current Outpatient Rx  Name  Route  Sig  Dispense  Refill  . amitriptyline (ELAVIL) 10 MG tablet   Oral   Take 10 mg by mouth at bedtime.         Marland Kitchen aspirin EC 81 MG tablet   Oral   Take 81 mg by mouth daily.         . citalopram (CELEXA) 10 MG tablet   Oral   Take 10 mg by mouth daily. Reported on 08/09/2015         . dicyclomine (BENTYL) 20 MG tablet   Oral   Take 20 mg by mouth every 6 (six) hours as needed for spasms.         Marland Kitchen loratadine (CLARITIN) 10 MG tablet   Oral   Take 10 mg by mouth daily.         . Multiple Vitamin  (MULTI-VITAMINS) TABS   Oral   Take 1 tablet by mouth daily.         Marland Kitchen omeprazole (PRILOSEC) 20 MG capsule   Oral   Take 20 mg by mouth 2 (two) times daily as needed (for acid reflux).         Marland Kitchen oxyCODONE-acetaminophen (PERCOCET) 10-325 MG tablet   Oral   Take 1 tablet by mouth every 6 (six) hours as needed for pain.   20 tablet   0   . prochlorperazine (COMPAZINE) 10 MG tablet   Oral   Take 1 tablet (10 mg total) by mouth every 6 (six) hours as needed for nausea.   30 tablet   1     Allergies Nsaids and Penicillins  Family History  Problem Relation Age of Onset  . Heart disease Mother   . Stroke Father     Social History Social History  Substance Use Topics  . Smoking status: Current Every Day Smoker -- 0.50 packs/day    Types: Cigarettes  . Smokeless tobacco: None  . Alcohol Use: No    Review of Systems Constitutional: No fever/chills Cardiovascular: Denies chest pain. Respiratory:  Denies shortness of breath. Genitourinary: Negative for dysuria. Musculoskeletal: Positive for back pain. Neurological: Negative for headaches, focal weakness or numbness.  10-point ROS otherwise negative.  ____________________________________________   PHYSICAL EXAM:  VITAL SIGNS: ED Triage Vitals  Enc Vitals Group     BP 08/16/15 1652 146/82 mmHg     Pulse Rate 08/16/15 1652 93     Resp 08/16/15 1652 20     Temp 08/16/15 1652 98.1 F (36.7 C)     Temp Source 08/16/15 1652 Oral     SpO2 08/16/15 1652 99 %     Weight 08/16/15 1652 130 lb (58.968 kg)     Height 08/16/15 1652  (1.676 m)     Head Cir --      Peak Flow --      Pain Score 08/16/15 1658 10     Pain Loc --      Pain Edu? --      Excl. in GC? --     Constitutional: Alert and oriented. Well appearing and in no acute distress.  Cardiovascular: Normal rate, regular rhythm. Grossly normal heart sounds.  Good peripheral circulation. Respiratory: Normal respiratory effort.  No retractions. Lungs  CTAB. Gastrointestinal: Soft and nontender. No distention. No abdominal bruits. No CVA tenderness. Musculoskeletal: Point tenderness to the lumbar spine. Straight leg raise positive bilaterally. Distally neurovascularly intact. Neurologic:  Normal speech and language. No gross focal neurologic deficits are appreciated. No gait instability. Skin:  Skin is warm, dry and intact. No rash noted. Psychiatric: Mood and affect are normal. Speech and behavior are normal.  ____________________________________________   LABS (all labs ordered are listed, but only abnormal results are displayed)  Labs Reviewed - No data to display   PROCEDURES  Procedure(s) performed: None  Critical Care performed: No  ____________________________________________   INITIAL IMPRESSION / ASSESSMENT AND PLAN / ED COURSE  Pertinent labs & imaging results that were available during my care of the patient were reviewed by me and considered in my medical decision making (see chart for details).  Chronic low back pain. No ocular controlled substance for new system reviewed. Rx given for Percocet 10/325 #12. Patient follow-up with PCP as directed. She voices no other emergency medical complaints at this time. ____________________________________________   FINAL CLINICAL IMPRESSION(S) / ED DIAGNOSES  Final diagnoses:  Bulging lumbar disc     This chart was dictated using voice recognition software/Dragon. Despite best efforts to proofread, errors can occur which can change the meaning. Any change was purely unintentional.   Evangeline Dakin, PA-C 08/16/15 1741  Emily Filbert, MD 08/16/15 [phone]

## 2015-08-18 ENCOUNTER — Ambulatory Visit: Payer: BLUE CROSS/BLUE SHIELD | Attending: Pain Medicine | Admitting: Pain Medicine

## 2015-08-18 ENCOUNTER — Encounter: Payer: Self-pay | Admitting: Pain Medicine

## 2015-08-18 VITALS — BP 152/69 | HR 85 | Temp 98.1°F | Resp 16 | Ht 66.0 in | Wt 135.0 lb

## 2015-08-18 DIAGNOSIS — M4690 Unspecified inflammatory spondylopathy, site unspecified: Secondary | ICD-10-CM | POA: Diagnosis not present

## 2015-08-18 DIAGNOSIS — M4806 Spinal stenosis, lumbar region: Secondary | ICD-10-CM | POA: Diagnosis not present

## 2015-08-18 DIAGNOSIS — M5136 Other intervertebral disc degeneration, lumbar region: Secondary | ICD-10-CM | POA: Diagnosis not present

## 2015-08-18 DIAGNOSIS — M5030 Other cervical disc degeneration, unspecified cervical region: Secondary | ICD-10-CM

## 2015-08-18 DIAGNOSIS — M533 Sacrococcygeal disorders, not elsewhere classified: Secondary | ICD-10-CM | POA: Diagnosis not present

## 2015-08-18 DIAGNOSIS — M19011 Primary osteoarthritis, right shoulder: Secondary | ICD-10-CM

## 2015-08-18 DIAGNOSIS — M5126 Other intervertebral disc displacement, lumbar region: Secondary | ICD-10-CM | POA: Insufficient documentation

## 2015-08-18 DIAGNOSIS — Z9889 Other specified postprocedural states: Secondary | ICD-10-CM

## 2015-08-18 DIAGNOSIS — M545 Low back pain: Secondary | ICD-10-CM | POA: Diagnosis present

## 2015-08-18 DIAGNOSIS — M47816 Spondylosis without myelopathy or radiculopathy, lumbar region: Secondary | ICD-10-CM

## 2015-08-18 DIAGNOSIS — M48062 Spinal stenosis, lumbar region with neurogenic claudication: Secondary | ICD-10-CM

## 2015-08-18 DIAGNOSIS — M47812 Spondylosis without myelopathy or radiculopathy, cervical region: Secondary | ICD-10-CM

## 2015-08-18 DIAGNOSIS — M79606 Pain in leg, unspecified: Secondary | ICD-10-CM | POA: Diagnosis present

## 2015-08-18 MED ORDER — OXYCODONE-ACETAMINOPHEN 10-325 MG PO TABS: ORAL_TABLET | ORAL | Status: DC

## 2015-08-18 NOTE — Progress Notes (Signed)
Safety precautions to be maintained throughout the outpatient stay will include: orient to surroundings, keep bed in low position, maintain call bell within reach at all times, provide assistance with transfer out of bed and ambulation.  

## 2015-08-18 NOTE — Patient Instructions (Addendum)
PLAN   Continue present medication oxycodone acetaminophen   F/U PCP Dr. Delton Prairie for evaliation of  BP and general medical  condition  F/U surgical evaluation. May consider pending follow-up evaluations  F/U neurological evaluation. May consider pending follow-up evaluations  May consider radiofrequency rhizolysis or intraspinal procedures pending response to present treatment and F/U evaluation   Patient to call [facility] should patient have concerns prior to scheduled return appointment. You were given a prescription for Percocet today.

## 2015-08-18 NOTE — Progress Notes (Signed)
Subjective:    Patient ID: Katrina Holmes, female    DOB: 18-Apr-1956, 60 y.o.   MRN: [id number]  HPI  The patient is a 60 year old female who returns to pain management for further evaluation and treatment of pain involving the lower back and lower extremity region predominantly. The patient also admits to pain occurring in the region of the mid back region of significant degree as well. . The patient has had exacerbation of her pain with pain becoming quite severe at times. Has been concern regarding intraspinal abnormalities to treatment patient's symptomatology including lumbar facet syndrome being a significant component of patient's pain. The patient states that her pain is aggravated to severe degree with twisting turning maneuvers and that the pain becomes more intense as the day progresses. The patient also has difficulty obtaining restful sleep due to severity of pain. We will avoid interventional treatment at time return appointment and will observe response to modification of medications as discussed visit and explained to patient on today's visit. The patient was understanding and agreed to suggested treatment plan. We will consider patient for further evaluation including Surgical neurological and psych evaluations. We will observe response to present treatment regimen and consider modifications of treatment pending follow-up evaluation.      Review of Systems     Objective:   Physical Exam There was tenderness of the splenius capitis and occipitalis musculature regions of mild to moderate degree. There was mild to moderate tenderness over the cervical facet cervical paraspinal musculature region. Patient appeared to be with mild tenderness to moderate tenderness of the acromioclavicular and glenohumeral joint regions. The patient appeared to be with slightly decreased grip strength and Tinel and Phalen's maneuver were without increased pain of significant degree. Palpation over the  region of the thoracic facet thoracic paraspinal must reason was attends to palpation of the mid and lower thoracic region a moderate degree. Lateral bending rotation extension and palpation of the lumbar facets reproduce moderate discomfort. There was moderate tenderness of the PSIS and PII S region as well as the gluteal and piriformis musculature regions. Straight leg raising was tolerates approximately 30 without a definite increase of pain with dorsiflexion noted. No definite sensory deficit or dermatomal distribution detected. There was increase of pain with pressure applied to the ileum with patient in lateral decubitus position with palpation of the greater trochanteric region iliotibial band region reproducing mild to moderate discomfort as well. No definite sensory deficit or dermatomal dystrophy detected. EHL strength appeared to be decreased. Homans. Abdomen was nontender with no costovertebral tenderness noted       Assessment & Plan:     Degenerative disc disease lumbar spine L3 -4 broad-based posterior disc bulge with thickening of the ligamentum flavum L4-5 posterior disc bulge facet arthropathy and thickening of the ligamentum flavum resulting in moderate canal stenosis and moderate neural foraminal narrowing. L5-S1 broad-based disc bulge posteriorly thickening of the ligamentum  flavum resulting in spinal stenosis and moderate bilateral neural foraminal stenosis  Lumbar stenosis with neurogenic claudication  Lumbar facet syndrome  Sacroiliac joint dysfunction      PLAN   Continue present medication oxycodone acetaminophen   F/U PCP Dr. Delton Prairie for evaliation of  BP and general medical  condition  F/U surgical evaluation. Has been addressed  F/U neurological evaluation. May consider PNCV/EMG studies and other studies pending follow-up evaluations  May consider radiofrequency rhizolysis or intraspinal procedures pending response to present treatment and F/U  evaluation   Patient to  call [facility] should patient have concerns prior to scheduled return appointment.

## 2015-08-25 ENCOUNTER — Ambulatory Visit: Payer: BLUE CROSS/BLUE SHIELD | Admitting: Pain Medicine

## 2015-08-31 ENCOUNTER — Other Ambulatory Visit: Payer: Self-pay | Admitting: Pain Medicine

## 2015-09-16 ENCOUNTER — Encounter: Payer: Self-pay | Admitting: Pain Medicine

## 2015-09-16 ENCOUNTER — Ambulatory Visit: Payer: BLUE CROSS/BLUE SHIELD | Attending: Pain Medicine | Admitting: Pain Medicine

## 2015-09-16 VITALS — BP 132/78 | HR 102 | Temp 98.1°F | Resp 16 | Ht 66.0 in | Wt 135.0 lb

## 2015-09-16 DIAGNOSIS — M19011 Primary osteoarthritis, right shoulder: Secondary | ICD-10-CM

## 2015-09-16 DIAGNOSIS — M5030 Other cervical disc degeneration, unspecified cervical region: Secondary | ICD-10-CM

## 2015-09-16 DIAGNOSIS — M545 Low back pain: Secondary | ICD-10-CM | POA: Diagnosis present

## 2015-09-16 DIAGNOSIS — M533 Sacrococcygeal disorders, not elsewhere classified: Secondary | ICD-10-CM | POA: Diagnosis not present

## 2015-09-16 DIAGNOSIS — M47816 Spondylosis without myelopathy or radiculopathy, lumbar region: Secondary | ICD-10-CM

## 2015-09-16 DIAGNOSIS — M4806 Spinal stenosis, lumbar region: Secondary | ICD-10-CM | POA: Insufficient documentation

## 2015-09-16 DIAGNOSIS — M5126 Other intervertebral disc displacement, lumbar region: Secondary | ICD-10-CM | POA: Insufficient documentation

## 2015-09-16 DIAGNOSIS — M47812 Spondylosis without myelopathy or radiculopathy, cervical region: Secondary | ICD-10-CM

## 2015-09-16 DIAGNOSIS — M1288 Other specific arthropathies, not elsewhere classified, other specified site: Secondary | ICD-10-CM | POA: Diagnosis not present

## 2015-09-16 DIAGNOSIS — M5136 Other intervertebral disc degeneration, lumbar region: Secondary | ICD-10-CM | POA: Diagnosis not present

## 2015-09-16 DIAGNOSIS — M48062 Spinal stenosis, lumbar region with neurogenic claudication: Secondary | ICD-10-CM

## 2015-09-16 DIAGNOSIS — Z9889 Other specified postprocedural states: Secondary | ICD-10-CM

## 2015-09-16 MED ORDER — OXYCODONE-ACETAMINOPHEN 10-325 MG PO TABS: ORAL_TABLET | ORAL | Status: DC

## 2015-09-16 NOTE — Patient Instructions (Addendum)
PLAN   Continue present medication oxycodone acetaminophen  Lumbar facet, medial branch nerve, blocks to be performed at time of return appointment   F/U PCP Dr. Delton PrairiePaul Tobin for evaliation of  BP and general medical  condition  F/U surgical evaluation. May consider pending follow-up evaluations  F/U neurological evaluation. May consider PNCV/EMG studies and other studies pending follow-up evaluations  May consider radiofrequency rhizolysis or intraspinal procedures pending response to present treatment and F/U evaluation   Patient to call Pain Management Center should patient have concerns prior to scheduled return appointment. Stop Aspirin 5 days before procedure. Facet Blocks Patient Information  Description: The facets are joints in the spine between the vertebrae.  Like any joints in the body, facets can become irritated and painful.  Arthritis can also effect the facets.  By injecting steroids and local anesthetic in and around these joints, we can temporarily block the nerve supply to them.  Steroids act directly on irritated nerves and tissues to reduce selling and inflammation which often leads to decreased pain.  Facet blocks may be done anywhere along the spine from the neck to the low back depending upon the location of your pain.   After numbing the skin with local anesthetic (like Novocaine), a small needle is passed onto the facet joints under x-ray guidance.  You may experience a sensation of pressure while this is being done.  The entire block usually lasts about 15-25 minutes.   Conditions which may be treated by facet blocks:   Low back/buttock pain  Neck/shoulder pain  Certain types of headaches  Preparation for the injection:  1. Do not eat any solid food or dairy products within 8 hours of your appointment. 2. You may drink clear liquid up to 3 hours before appointment.  Clear liquids include water, black coffee, juice or soda.  No milk or cream please. 3. You  may take your regular medication, including pain medications, with a sip of water before your appointment.  Diabetics should hold regular insulin (if taken separately) and take 1/2 normal NPH dose the morning of the procedure.  Carry some sugar containing items with you to your appointment. 4. A driver must accompany you and be prepared to drive you home after your procedure. 5. Bring all your current medications with you. 6. An IV may be inserted and sedation may be given at the discretion of the physician. 7. A blood pressure cuff, EKG and other monitors will often be applied during the procedure.  Some patients may need to have extra oxygen administered for a short period. 8. You will be asked to provide medical information, including your allergies and medications, prior to the procedure.  We must know immediately if you are taking blood thinners (like Coumadin/Warfarin) or if you are allergic to IV iodine contrast (dye).  We must know if you could possible be pregnant.  Possible side-effects:   Bleeding from needle site  Infection (rare, may require surgery)  Nerve injury (rare)  Numbness & tingling (temporary)  Difficulty urinating (rare, temporary)  Spinal headache (a headache worse with upright posture)  Light-headedness (temporary)  Pain at injection site (serveral days)  Decreased blood pressure (rare, temporary)  Weakness in arm/leg (temporary)  Pressure sensation in back/neck (temporary)   Call if you experience:   Fever/chills associated with headache or increased back/neck pain  Headache worsened by an upright position  New onset, weakness or numbness of an extremity below the injection site  Hives or difficulty breathing (go  to the emergency room)  Inflammation or drainage at the injection site(s)  Severe back/neck pain greater than usual  New symptoms which are concerning to you  Please note:  Although the local anesthetic injected can often make  your back or neck feel good for several hours after the injection, the pain will likely return. It takes 3-7 days for steroids to work.  You may not notice any pain relief for at least one week.  If effective, we will often do a series of 2-3 injections spaced 3-6 weeks apart to maximally decrease your pain.  After the initial series, you may be a candidate for a more permanent nerve block of the facets.  If you have any questions, please call #[phone]) [facility]

## 2015-09-16 NOTE — Progress Notes (Signed)
   Subjective:    Patient ID: Katrina Holmes, female    DOB: Sep 21, 1955, 60 y.o.   MRN: [id number]  HPI   The patient is a 60 year old female who returns to pain management for further evaluation and treatment of pain involving the mid lower back and lower extremity region. The patient states that her pain is aggravated by twisting turning maneuvers. The patient states that she has to that her husband perform chores such as vacuuming sleeping that she is unable to tolerate the twisting turning maneuvers. The patient continues medication as prescribed consisting of oxycodone acetaminophen. We discussed patient's condition and will proceed with lumbar facet, medial branch nerve, blocks at time return appointment in attempt to decrease severity of patient's symptoms, minimize progression of patient's symptoms, and avoid the need for more involved treatment. All agreed to suggested treatment plan      Review of Systems     Objective:   Physical Exam  There was tenderness to palpation of the splenius capitis and separate talus musculature regions of mild degree with mild tenderness of the cervical facet cervical paraspinal musculature region. Palpation over the region of the thoracic facet thoracic paraspinal musculature region was attends to palpation of moderate degree to moderately severe degree in the lower thoracic region. The patient was without increased pain of significant degree with Tinel and Phalen's maneuver and appeared to be with bilaterally equal grip strength. Palpation over the lumbar paraspinal musculature region lumbar facet region was moderately severe discomfort with lateral bending rotation extension and palpation over the lumbar facets reproducing moderately severe discomfort. There was tenderness of the PSIS and PII S region as well as of moderate to moderately severe degree with mild tenderness of the greater trochanteric region iliotibial band region. Straight leg raise was  tolerates approximately 20 without a definite increased pain with dorsiflexion noted. There was negative clonus negative Homans. No definite sensory deficit or dermatomal distribution of the lower extremities noted. There was increase of pain with pressure applied to the ileum with patient in lateral decubitus position. There was significant increased pain with Patrick's maneuver. DTRs were difficult to this patient had difficulty relaxing. Abdomen was nontender with no costovertebral tenderness noted.     Assessment & Plan:       Degenerative disc disease lumbar spine L3 -4 broad-based posterior disc bulge with thickening of the ligamentum flavum L4-5 posterior disc bulge facet arthropathy and thickening of the ligamentum flavum resulting in moderate canal stenosis and moderate neural foraminal narrowing. L5-S1 broad-based disc bulge posteriorly thickening of the ligamentum  flavum resulting in spinal stenosis and moderate bilateral neural foraminal stenosis  Lumbar stenosis with neurogenic claudication  Lumbar facet syndrome  Sacroiliac joint dysfunction       PLAN   Continue present medication oxycodone acetaminophen  Lumbar facet, medial branch nerve, blocks to be performed at time of return appointment   F/U PCP Dr. Delton PrairiePaul Tobin for evaliation of  BP and general medical  condition  F/U surgical evaluation. May consider pending follow-up evaluations  F/U neurological evaluation. May consider PNCV/EMG studies and other studies pending follow-up evaluations  May consider radiofrequency rhizolysis or intraspinal procedures pending response to present treatment and F/U evaluation   Patient to call Pain Management Center should patient have concerns prior to scheduled return appointment.

## 2015-09-16 NOTE — Progress Notes (Signed)
Safety precautions to be maintained throughout the outpatient stay will include: orient to surroundings, keep bed in low position, maintain call bell within reach at all times, provide assistance with transfer out of bed and ambulation.  

## 2015-09-29 ENCOUNTER — Ambulatory Visit: Payer: BLUE CROSS/BLUE SHIELD | Admitting: Pain Medicine

## 2015-10-13 ENCOUNTER — Ambulatory Visit: Payer: BLUE CROSS/BLUE SHIELD | Attending: Pain Medicine | Admitting: Pain Medicine

## 2015-10-13 ENCOUNTER — Encounter: Payer: Self-pay | Admitting: Pain Medicine

## 2015-10-13 VITALS — BP 144/68 | HR 99 | Temp 98.2°F | Resp 18 | Ht 66.0 in | Wt 130.0 lb

## 2015-10-13 DIAGNOSIS — M19011 Primary osteoarthritis, right shoulder: Secondary | ICD-10-CM

## 2015-10-13 DIAGNOSIS — M545 Low back pain: Secondary | ICD-10-CM | POA: Diagnosis present

## 2015-10-13 DIAGNOSIS — M4806 Spinal stenosis, lumbar region: Secondary | ICD-10-CM | POA: Diagnosis not present

## 2015-10-13 DIAGNOSIS — M79606 Pain in leg, unspecified: Secondary | ICD-10-CM | POA: Diagnosis present

## 2015-10-13 DIAGNOSIS — M5030 Other cervical disc degeneration, unspecified cervical region: Secondary | ICD-10-CM

## 2015-10-13 DIAGNOSIS — M5126 Other intervertebral disc displacement, lumbar region: Secondary | ICD-10-CM | POA: Insufficient documentation

## 2015-10-13 DIAGNOSIS — Z9889 Other specified postprocedural states: Secondary | ICD-10-CM

## 2015-10-13 DIAGNOSIS — M5136 Other intervertebral disc degeneration, lumbar region: Secondary | ICD-10-CM | POA: Diagnosis not present

## 2015-10-13 DIAGNOSIS — M47812 Spondylosis without myelopathy or radiculopathy, cervical region: Secondary | ICD-10-CM

## 2015-10-13 DIAGNOSIS — M47816 Spondylosis without myelopathy or radiculopathy, lumbar region: Secondary | ICD-10-CM

## 2015-10-13 DIAGNOSIS — M48062 Spinal stenosis, lumbar region with neurogenic claudication: Secondary | ICD-10-CM

## 2015-10-13 MED ORDER — TRIAMCINOLONE ACETONIDE 40 MG/ML IJ SUSP
40.0000 mg | Freq: Once | INTRAMUSCULAR | Status: AC
  Administered 2015-10-13: 40 mg

## 2015-10-13 MED ORDER — OXYCODONE-ACETAMINOPHEN 10-325 MG PO TABS: ORAL_TABLET | ORAL | Status: DC

## 2015-10-13 MED ORDER — CIPROFLOXACIN IN D5W 400 MG/200ML IV SOLN
400.0000 mg | Freq: Once | INTRAVENOUS | Status: AC
  Administered 2015-10-13: 400 mg via INTRAVENOUS

## 2015-10-13 MED ORDER — FENTANYL CITRATE (PF) 100 MCG/2ML IJ SOLN
100.0000 ug | Freq: Once | INTRAMUSCULAR | Status: AC
  Administered 2015-10-13: 1400 ug via INTRAVENOUS

## 2015-10-13 MED ORDER — CIPROFLOXACIN HCL 250 MG PO TABS: 250.0000 mg | ORAL_TABLET | Freq: Two times a day (BID) | ORAL | Status: DC

## 2015-10-13 MED ORDER — MIDAZOLAM HCL 5 MG/5ML IJ SOLN
5.0000 mg | Freq: Once | INTRAMUSCULAR | Status: AC
  Administered 2015-10-13: 5 mg via INTRAVENOUS

## 2015-10-13 MED ORDER — BUPIVACAINE HCL (PF) 0.25 % IJ SOLN
30.0000 mL | Freq: Once | INTRAMUSCULAR | Status: AC
  Administered 2015-10-13: 30 mL

## 2015-10-13 MED ORDER — MIDAZOLAM HCL 5 MG/5ML IJ SOLN
INTRAMUSCULAR | Status: AC
  Administered 2015-10-13: 5 mg via INTRAVENOUS
  Filled 2015-10-13: qty 5

## 2015-10-13 MED ORDER — CIPROFLOXACIN IN D5W 400 MG/200ML IV SOLN
INTRAVENOUS | Status: AC
  Administered 2015-10-13: 400 mg via INTRAVENOUS
  Filled 2015-10-13: qty 200

## 2015-10-13 MED ORDER — ORPHENADRINE CITRATE 30 MG/ML IJ SOLN
60.0000 mg | Freq: Once | INTRAMUSCULAR | Status: AC
  Administered 2015-10-13: 60 mg via INTRAMUSCULAR

## 2015-10-13 MED ORDER — BUPIVACAINE HCL (PF) 0.25 % IJ SOLN
INTRAMUSCULAR | Status: AC
  Administered 2015-10-13: 30 mL
  Filled 2015-10-13: qty 30

## 2015-10-13 MED ORDER — ORPHENADRINE CITRATE 30 MG/ML IJ SOLN
INTRAMUSCULAR | Status: AC
  Administered 2015-10-13: 60 mg via INTRAMUSCULAR
  Filled 2015-10-13: qty 2

## 2015-10-13 MED ORDER — FENTANYL CITRATE (PF) 100 MCG/2ML IJ SOLN
INTRAMUSCULAR | Status: AC
  Administered 2015-10-13: 1400 ug via INTRAVENOUS
  Filled 2015-10-13: qty 2

## 2015-10-13 MED ORDER — TRIAMCINOLONE ACETONIDE 40 MG/ML IJ SUSP
INTRAMUSCULAR | Status: AC
  Administered 2015-10-13: 40 mg
  Filled 2015-10-13: qty 1

## 2015-10-13 MED ORDER — LACTATED RINGERS IV SOLN: 1000.0000 mL | INTRAVENOUS | Status: DC

## 2015-10-13 NOTE — Progress Notes (Signed)
Subjective:    Patient ID: Katrina Holmes, female    DOB: [birth year], 60 y.o.   MRN: [id number]  HPI  PROCEDURE PERFORMED: Lumbar facet (medial branch block)   NOTE: The patient is a 60 y.o. female who returns to [facility] Management Center for further evaluation and treatment of pain involving the lumbar and lower extremity region. MRI  revealed the patient to be with evidence of degenerative disc disease lumbar spine L3 -4 broad-based posterior disc bulge with thickening of the ligamentum flavum L4-5 posterior disc bulge facet arthropathy and thickening of the ligamentum flavum resulting in moderate canal stenosis and moderate neural foraminal narrowing. L5-S1 broad-based disc bulge posteriorly thickening of the ligamentum  flavum resulting in spinal stenosis and moderate bilateral neural foraminal stenosis. There is concern regarding significant component of patient's pain being due to lumbar facet arthropathy with lumbar facet syndrome. The risks, benefits, and expectations of the procedure have been discussed and explained to the patient who was understanding and in agreement with suggested treatment plan. We will proceed with interventional treatment consisting of lumbar facet, medial branch nerve, blocks as discussed and as explained to the patient who was understanding and wished to proceed with procedure as planned.   DESCRIPTION OF PROCEDURE: Lumbar facet (medial branch block) with IV Versed, IV fentanyl conscious sedation, EKG, blood pressure, pulse, capnography, and pulse oximetry monitoring. The procedure was performed with the patient in the prone position. Betadine prep of proposed entry site performed.   NEEDLE PLACEMENT AT: Left L 2 lumbar facet (medial branch block). Under fluoroscopic guidance with oblique orientation of 15 degrees, a 22-gauge needle was inserted at the L 2 vertebral body level with needle placed at the targeted area of Burton's Eye or Eye of the Scotty Dog with  documentation of needle placement in the superior and lateral border of targeted area of Burton's Eye or Eye of the Scotty Dog with oblique orientation of 15 degrees. Following documentation of needle placement at the L 2 vertebral body level, needle placement was then accomplished at the L 3 vertebral body level.   NEEDLE PLACEMENT AT L3, L4, and L5 VERTEBRAL BODY LEVELS ON THE LEFT SIDE The procedure was performed at the L3, L4, and L5 vertebral body levels exactly as was performed at the L 2 vertebral body level utilizing the same technique and under fluoroscopic guidance.   Needle placement was then verified at all levels on lateral view. Following documentation of needle placement at all levels on lateral view and following negative aspiration for heme and CSF, each level was injected with 1 mL of 0.25% bupivacaine with Kenalog.    LUMBAR FACET, MEDIAL BRANCH NERVE, BLOCKS PERFORMED ON THE RIGHT SIDE   The procedure was performed on the right side exactly as was performed on the left side at the same levels and utilizing the same technique under fluoroscopic guidance.     The patient tolerated the procedure well. A total of 40 mg of Kenalog was utilized for the procedure.   PLAN:  1. Medications: The patient will continue presently prescribed medication oxycodone acetaminophen. 2. May consider modification of treatment regimen at time of return appointment pending response to treatment rendered on today's visit. 3. The patient is to follow-up with primary care physician Dr. Delton Prairie for further evaluation of blood pressure and general medical condition status post steroid injection performed on today's visit. 4. Surgical follow-up evaluation. Has been addressed 5. Neurological follow-up evaluation. May consider PNCV EMG studies and other  studies 6. The patient may be candidate for radiofrequency procedures, implantation type procedures, and other treatment pending response to  treatment and follow-up evaluation. 7. The patient has been advised to call the Pain Management Center prior to scheduled return appointment should there be significant change in condition or should patient have other concerns regarding condition prior to scheduled return appointment.  The patient is understanding and in agreement with suggested treatment plan.   Review of Systems     Objective:   Physical Exam        Assessment & Plan:

## 2015-10-13 NOTE — Progress Notes (Signed)
Safety precautions to be maintained throughout the outpatient stay will include: orient to surroundings, keep bed in low position, maintain call bell within reach at all times, provide assistance with transfer out of bed and ambulation.  

## 2015-10-13 NOTE — Patient Instructions (Addendum)
Plan   Continue present medication oxycodone acetaminophen. Begin taking Cipro antibiotic today as prescribed  F/U PCP Dr. Delton PrairiePaul Tobin for evaliation of  BP and general medical  condition. Follow-up with Dr. Delton PrairiePaul Tobin as we discussed for evaluation of blood pressure  F/U surgical evaluation. May consider pending follow-up evaluations  F/U neurological evaluation. May consider PNCV/EMG studies and other studies pending follow-up evaluations  May consider radiofrequency rhizolysis or intraspinal procedures pending response to present treatment and F/U evaluation   Patient to call Pain Management Center should patient have concerns prior to scheduled return appointment.Pain Management Discharge Instructions  General Discharge Instructions :  If you need to reach your doctor call: Monday-Friday 8:00 am - 4:00 pm at [phone] or toll free [phone].  After clinic hours [phone] to have operator reach doctor.  Bring all of your medication bottles to all your appointments in the pain clinic.  To cancel or reschedule your appointment with Pain Management please remember to call 24 hours in advance to avoid a fee.  Refer to the educational materials which you have been given on: General Risks, I had my Procedure. Discharge Instructions, Post Sedation.  Post Procedure Instructions:  The drugs you were given will stay in your system until tomorrow, so for the next 24 hours you should not drive, make any legal decisions or drink any alcoholic beverages.  You may eat anything you prefer, but it is better to start with liquids then soups and crackers, and gradually work up to solid foods.  Please notify your doctor immediately if you have any unusual bleeding, trouble breathing or pain that is not related to your normal pain.  Depending on the type of procedure that was done, some parts of your body may feel week and/or numb.  This usually clears up by tonight or the next day.  Walk  with the use of an assistive device or accompanied by an adult for the 24 hours.  You may use ice on the affected area for the first 24 hours.  Put ice in a Ziploc bag and cover with a towel and place against area 15 minutes on 15 minutes off.  You may switch to heat after 24 hours.

## 2015-10-14 ENCOUNTER — Telehealth: Payer: Self-pay | Admitting: *Deleted

## 2015-10-14 ENCOUNTER — Ambulatory Visit: Payer: BLUE CROSS/BLUE SHIELD | Admitting: Pain Medicine

## 2015-10-14 NOTE — Telephone Encounter (Signed)
completed

## 2015-11-09 ENCOUNTER — Encounter: Payer: Self-pay | Admitting: *Deleted

## 2015-11-09 ENCOUNTER — Emergency Department
Admission: EM | Admit: 2015-11-09 | Discharge: 2015-11-09 | Disposition: A | Discharge status: Home / Self Care | Payer: BLUE CROSS/BLUE SHIELD | Attending: Emergency Medicine | Admitting: Emergency Medicine

## 2015-11-09 ENCOUNTER — Emergency Department: Payer: BLUE CROSS/BLUE SHIELD

## 2015-11-09 DIAGNOSIS — Z792 Long term (current) use of antibiotics: Secondary | ICD-10-CM | POA: Diagnosis not present

## 2015-11-09 DIAGNOSIS — Z7982 Long term (current) use of aspirin: Secondary | ICD-10-CM | POA: Insufficient documentation

## 2015-11-09 DIAGNOSIS — M5030 Other cervical disc degeneration, unspecified cervical region: Secondary | ICD-10-CM | POA: Insufficient documentation

## 2015-11-09 DIAGNOSIS — M5136 Other intervertebral disc degeneration, lumbar region: Secondary | ICD-10-CM | POA: Insufficient documentation

## 2015-11-09 DIAGNOSIS — R1030 Lower abdominal pain, unspecified: Secondary | ICD-10-CM | POA: Diagnosis not present

## 2015-11-09 DIAGNOSIS — I10 Essential (primary) hypertension: Secondary | ICD-10-CM | POA: Insufficient documentation

## 2015-11-09 DIAGNOSIS — Z79899 Other long term (current) drug therapy: Secondary | ICD-10-CM | POA: Diagnosis not present

## 2015-11-09 DIAGNOSIS — F17210 Nicotine dependence, cigarettes, uncomplicated: Secondary | ICD-10-CM | POA: Diagnosis not present

## 2015-11-09 LAB — CBC
HEMATOCRIT: 40.4 % (ref 35.0–47.0)
Hemoglobin: 13.8 g/dL (ref 12.0–16.0)
MCH: 29.9 pg (ref 26.0–34.0)
MCHC: 34.1 g/dL (ref 32.0–36.0)
MCV: 87.5 fL (ref 80.0–100.0)
Platelets: 348 10*3/uL (ref 150–440)
RBC: 4.61 MIL/uL (ref 3.80–5.20)
RDW: 14.8 % — AB (ref 11.5–14.5)
WBC: 8.8 10*3/uL (ref 3.6–11.0)

## 2015-11-09 LAB — URINALYSIS COMPLETE WITH MICROSCOPIC (ARMC ONLY)
BACTERIA UA: NONE SEEN
BILIRUBIN URINE: NEGATIVE
GLUCOSE, UA: NEGATIVE mg/dL
HGB URINE DIPSTICK: NEGATIVE
Ketones, ur: NEGATIVE mg/dL
LEUKOCYTES UA: NEGATIVE
NITRITE: NEGATIVE
Protein, ur: NEGATIVE mg/dL
RBC / HPF: NONE SEEN RBC/hpf (ref 0–5)
SPECIFIC GRAVITY, URINE: 1.003 — AB (ref 1.005–1.030)
Squamous Epithelial / LPF: NONE SEEN
pH: 5 (ref 5.0–8.0)

## 2015-11-09 LAB — COMPREHENSIVE METABOLIC PANEL
ALT: 22 U/L (ref 14–54)
ANION GAP: 9 (ref 5–15)
AST: 21 U/L (ref 15–41)
Albumin: 4.3 g/dL (ref 3.5–5.0)
Alkaline Phosphatase: 60 U/L (ref 38–126)
BILIRUBIN TOTAL: 0.1 mg/dL — AB (ref 0.3–1.2)
BUN: 7 mg/dL (ref 6–20)
CO2: 22 mmol/L (ref 22–32)
Calcium: 9.3 mg/dL (ref 8.9–10.3)
Chloride: 106 mmol/L (ref 101–111)
Creatinine, Ser: 0.47 mg/dL (ref 0.44–1.00)
Glucose, Bld: 100 mg/dL — ABNORMAL HIGH (ref 65–99)
Potassium: 3.6 mmol/L (ref 3.5–5.1)
Sodium: 137 mmol/L (ref 135–145)
TOTAL PROTEIN: 7.9 g/dL (ref 6.5–8.1)

## 2015-11-09 LAB — LIPASE, BLOOD: Lipase: 38 U/L (ref 11–51)

## 2015-11-09 MED ORDER — PROCHLORPERAZINE MALEATE 10 MG PO TABS: 10.0000 mg | ORAL_TABLET | Freq: Four times a day (QID) | ORAL | Status: DC | PRN

## 2015-11-09 MED ORDER — IOPAMIDOL (ISOVUE-300) INJECTION 61%
100.0000 mL | Freq: Once | INTRAVENOUS | Status: AC | PRN
  Administered 2015-11-09: 100 mL via INTRAVENOUS

## 2015-11-09 MED ORDER — HYDROMORPHONE HCL 1 MG/ML IJ SOLN
1.0000 mg | Freq: Once | INTRAMUSCULAR | Status: AC
  Administered 2015-11-09: 1 mg via INTRAVENOUS
  Filled 2015-11-09: qty 1

## 2015-11-09 MED ORDER — TRAMADOL HCL 50 MG PO TABS: 50.0000 mg | ORAL_TABLET | Freq: Four times a day (QID) | ORAL | Status: DC | PRN

## 2015-11-09 MED ORDER — DIATRIZOATE MEGLUMINE & SODIUM 66-10 % PO SOLN
15.0000 mL | Freq: Once | ORAL | Status: AC
  Administered 2015-11-09: 15 mL via ORAL

## 2015-11-09 MED ORDER — ONDANSETRON HCL 4 MG/2ML IJ SOLN
4.0000 mg | Freq: Once | INTRAMUSCULAR | Status: AC
  Administered 2015-11-09: 4 mg via INTRAVENOUS
  Filled 2015-11-09: qty 2

## 2015-11-09 NOTE — ED Provider Notes (Signed)
Time Seen: Approximately 1100 I have reviewed the triage notes  Chief Complaint: Abdominal Pain   History of Present Illness: Katrina Holmes is a 60 y.o. female who presents with a 2 week history of primarily bilateral lower abdominal pain with some continued nausea. No persistent vomiting. Patient denies any loose stool or diarrhea or constipation. She denies any dysuria or hematuria or urinary frequency. Patient denies any chest pain or pleuritic discomfort. She points exclusively to the lower abdominal region   Past Medical History  Diagnosis Date  . Anxiety   . Allergy   . Hypertension     Patient Active Problem List   Diagnosis Date Noted  . DDD (degenerative disc disease), cervical 05/25/2015  . H/O cervical spine surgery 05/25/2015  . Cervical facet syndrome 05/25/2015  . 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 DDD (degenerative disc disease), lumbar 05/25/2015  . Facet syndrome, lumbar 05/25/2015    Past Surgical History  Procedure Laterality Date  . Neck surgery  2006  . Shoulder arthroscopy with rotator cuff repair Left     3 surgeries (2006-2007-2008)  . Abdominal hysterectomy      Past Surgical History  Procedure Laterality Date  . Neck surgery  2006  . Shoulder arthroscopy with rotator cuff repair Left     3 surgeries (2006-2007-2008)  . Abdominal hysterectomy      Current Outpatient Rx  Name  Route  Sig  Dispense  Refill  . amitriptyline (ELAVIL) 10 MG tablet   Oral   Take 10 mg by mouth at bedtime. Reported on 10/13/2015         . aspirin EC 81 MG tablet   Oral   Take 81 mg by mouth daily.         . ciprofloxacin (CIPRO) 250 MG tablet   Oral   Take 1 tablet (250 mg total) by mouth 2 (two) times daily.   14 tablet   0   . citalopram (CELEXA) 10 MG tablet   Oral   Take 10 mg by mouth daily. Reported on 10/13/2015         . diazepam (VALIUM) 5 MG tablet   Oral   Take 5 mg by mouth every 8 (eight) hours as needed for anxiety.         .  dicyclomine (BENTYL) 20 MG tablet   Oral   Take 20 mg by mouth every 6 (six) hours as needed for spasms.         Marland Kitchen. loratadine (CLARITIN) 10 MG tablet   Oral   Take 10 mg by mouth daily.         . Multiple Vitamin (MULTI-VITAMINS) TABS   Oral   Take 1 tablet by mouth daily.         Marland Kitchen. omeprazole (PRILOSEC) 20 MG capsule   Oral   Take 20 mg by mouth 2 (two) times daily as needed (for acid reflux).         Marland Kitchen. oxyCODONE-acetaminophen (PERCOCET) 10-325 MG tablet      Limit 1 tab by mouth 2-4 times per day if tolerated   120 tablet   0   . prochlorperazine (COMPAZINE) 10 MG tablet   Oral   Take 1 tablet (10 mg total) by mouth every 6 (six) hours as needed for nausea. Patient not taking: Reported on 10/13/2015   30 tablet   1     Allergies:  Nsaids and Penicillins  Family History: Family History  Problem Relation Age  of Onset  . Heart disease Mother   . Stroke Father     Social History: Social History  Substance Use Topics  . Smoking status: Current Every Day Smoker -- 0.50 packs/day    Types: Cigarettes  . Smokeless tobacco: None  . Alcohol Use: No     Review of Systems:   10 point review of systems was performed and was otherwise negative:  Constitutional: No fever Eyes: No visual disturbances ENT: No sore throat, ear pain Cardiac: No chest pain Respiratory: No shortness of breath, wheezing, or stridor Abdomen: Abdominal pain does not radiate to the middle of the back. She does describe some occasional bilateral flank pain. She's tried Bentyl at home without relief. She recently switched to well water and she is wondering if her stomach is "" sensitive to the well water "" Endocrine: No weight loss, No night sweats Extremities: No peripheral edema, cyanosis Skin: No rashes, easy bruising Neurologic: No focal weakness, trouble with speech or swollowing Urologic: No dysuria, Hematuria, or urinary frequency   Physical Exam:  ED Triage Vitals  Enc  Vitals Group     BP 11/09/15 1034 122/103 mmHg     Pulse Rate 11/09/15 1034 89     Resp 11/09/15 1034 20     Temp 11/09/15 1034 98.1 F (36.7 C)     Temp Source 11/09/15 1034 Oral     SpO2 11/09/15 1034 99 %     Weight 11/09/15 1034 130 lb (58.968 kg)     Height 11/09/15 1034 5\' 6"  (1.676 m)     Head Cir --      Peak Flow --      Pain Score 11/09/15 1035 5     Pain Loc --      Pain Edu? --      Excl. in GC? --     General: Awake , Alert , and Oriented times 3; GCS 15 Head: Normal cephalic , atraumatic Eyes: Pupils equal , round, reactive to light Nose/Throat: No nasal drainage, patent upper airway without erythema or exudate.  Neck: Supple, Full range of motion, No anterior adenopathy or palpable thyroid masses Lungs: Clear to ascultation without wheezes , rhonchi, or rales Heart: Regular rate, regular rhythm without murmurs , gallops , or rubs Abdomen: Tender bilateral lower abdominal region without any peritoneal signs. No focal tenderness over McBurney's point. No reproducible back or flank pain. Negative Murphy's sign sounds are positive and symmetric in all 4 quadrants.       Extremities: 2 plus symmetric pulses. No edema, clubbing or cyanosis Neurologic: normal ambulation, Motor symmetric without deficits, sensory intact Skin: warm, dry, no rashes   Labs:   All laboratory work was reviewed including any pertinent negatives or positives listed below:  Labs Reviewed  COMPREHENSIVE METABOLIC PANEL - Abnormal; Notable for the following:    Glucose, Bld 100 (*)    Total Bilirubin 0.1 (*)    All other components within normal limits  CBC - Abnormal; Notable for the following:    RDW 14.8 (*)    All other components within normal limits  LIPASE, BLOOD  URINALYSIS COMPLETEWITH MICROSCOPIC ([facility] ONLY)  Laboratory work was reviewed and showed no clinically significant abnormalities.   Radiology:         CT Abdomen Pelvis W Contrast (Final result) Result time:  11/09/15 12:09:10   Final result by Rad Results In Interface (11/09/15 12:09:10)   Narrative:   CLINICAL DATA: Pt complains of lower abdominal pain below  umbilicus for 2 weeks with nausea, pt denies vomiting. HX hysterectomy. No ca hx.  EXAM: CT ABDOMEN AND PELVIS WITH CONTRAST  TECHNIQUE: Multidetector CT imaging of the abdomen and pelvis was performed using the standard protocol following bolus administration of intravenous contrast.  CONTRAST: ISOVUE-300 IOPAMIDOL (ISOVUE-300) INJECTION 61%  COMPARISON: 04/29/2015  FINDINGS: Lower chest: Mild findings of centrilobular emphysema.  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 Colon positioning normal. Appendix normal. Fifth several distal sigmoid colon diverticular present. Oval-shaped 1.4 cm hyperdense structure in the left pelvic small bowel on image 68/6, likely a calcium pill or similar hyperdense item within the bowel.  Vascular/Lymphatic: Aortoiliac atherosclerotic vascular disease.  Reproductive: Hysterectomy. Left ovary normal. Right ovary poorly seen.  Other: No supplemental non-categorized findings.  Musculoskeletal: Mild right foraminal stenosis at L5-S1 due to facet spurring. No significant umbilical hernia. Rectus abdominus musculature normal.  IMPRESSION: 1. 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 No findings of obstruction. Colon position normal. 2. Mild findings of centrilobular emphysema. 3. Mild right foraminal stenosis at L5-S1 due to facet spurring. 4. Appendix normal.   Electronically Signed By: Gaylyn Rong M.D. On: 11/09/2015 12:09       I personally reviewed the radiologic studies     ED Course:  Differential diagnosis includes but is not exclusive to ovarian  cyst, ovarian torsion, acute appendicitis, urinary tract infection, endometriosis, bowel obstruction, colitis, renal colic, gastroenteritis, etc.  Patient's stay here was uneventful and does not appear to have a surgical abdomen. Repeat exam of her abdomen after medication shows resolution majority of pain and she sitting upright and comfortably in the stretcher. She'll be prescribed medication for discomfort at home. She's been advised to follow up with her primary physician and may need referral to a gastroenterologist.    Assessment: * Acute unspecified abdominal pain     Plan:  Outpatient management Patient was advised to return immediately if condition worsens. Patient was advised to follow up with their primary care physician or other specialized physicians involved in their outpatient care. The patient and/or family member/power of attorney had laboratory results reviewed at the bedside. All questions and concerns were addressed and appropriate discharge instructions were distributed by the nursing staff.            Jennye Moccasin, MD 11/09/15 [phone]

## 2015-11-09 NOTE — Discharge Instructions (Signed)
Abdominal Pain, Adult °Many things can cause abdominal pain. Usually, abdominal pain is not caused by a disease and will improve without treatment. It can often be observed and treated at home. Your health care provider will do a physical exam and possibly order blood tests and X-rays to help determine the seriousness of your pain. However, in many cases, more time must pass before a clear cause of the pain can be found. Before that point, your health care provider may not know if you need more testing or further treatment. °HOME CARE INSTRUCTIONS °Monitor your abdominal pain for any changes. The following actions may help to alleviate any discomfort you are experiencing: °· Only take over-the-counter or prescription medicines as directed by your health care provider. °· Do not take laxatives unless directed to do so by your health care provider. °· Try a clear liquid diet (broth, tea, or water) as directed by your health care provider. Slowly move to a bland diet as tolerated. °SEEK MEDICAL CARE IF: °· You have unexplained abdominal pain. °· You have abdominal pain associated with nausea or diarrhea. °· You have pain when you urinate or have a bowel movement. °· You experience abdominal pain that wakes you in the night. °· You have abdominal pain that is worsened or improved by eating food. °· You have abdominal pain that is worsened with eating fatty foods. °· You have a fever. °SEEK IMMEDIATE MEDICAL CARE IF: °· Your pain does not go away within 2 hours. °· You keep throwing up (vomiting). °· Your pain is felt only in portions of the abdomen, such as the right side or the left lower portion of the abdomen. °· You pass bloody or black tarry stools. °MAKE SURE YOU: °· Understand these instructions. °· Will watch your condition. °· Will get help right away if you are not doing well or get worse. °  °This information is not intended to replace advice given to you by your health care provider. Make sure you discuss  any questions you have with your health care provider. °  °Document Released: 03/15/2005 Document Revised: 02/24/2015 Document Reviewed: 02/12/2013 °Elsevier Interactive Patient Education ©2016 [employer] ° ° °Please return immediately if condition worsens. Please contact her primary physician or the physician you were given for referral. If you have any specialist physicians involved in her treatment and plan please also contact them. Thank you for using [facility]. ° °

## 2015-11-09 NOTE — ED Notes (Signed)
Pt complains of lower abdominal pain below umbilicus for 2 weeks with nausea, pt denies vomiting

## 2015-11-09 NOTE — ED Notes (Signed)
Pt informed to return if any life threatening symptoms occur.  

## 2015-11-11 ENCOUNTER — Ambulatory Visit: Payer: BLUE CROSS/BLUE SHIELD | Attending: Pain Medicine | Admitting: Pain Medicine

## 2015-11-11 ENCOUNTER — Encounter: Payer: Self-pay | Admitting: Pain Medicine

## 2015-11-11 VITALS — BP 151/75 | HR 80 | Temp 98.1°F | Resp 16 | Ht 66.0 in | Wt 130.0 lb

## 2015-11-11 DIAGNOSIS — M5126 Other intervertebral disc displacement, lumbar region: Secondary | ICD-10-CM | POA: Diagnosis not present

## 2015-11-11 DIAGNOSIS — M4806 Spinal stenosis, lumbar region: Secondary | ICD-10-CM | POA: Diagnosis not present

## 2015-11-11 DIAGNOSIS — M542 Cervicalgia: Secondary | ICD-10-CM | POA: Insufficient documentation

## 2015-11-11 DIAGNOSIS — M48062 Spinal stenosis, lumbar region with neurogenic claudication: Secondary | ICD-10-CM

## 2015-11-11 DIAGNOSIS — M546 Pain in thoracic spine: Secondary | ICD-10-CM | POA: Diagnosis present

## 2015-11-11 DIAGNOSIS — M533 Sacrococcygeal disorders, not elsewhere classified: Secondary | ICD-10-CM | POA: Diagnosis not present

## 2015-11-11 DIAGNOSIS — Z9889 Other specified postprocedural states: Secondary | ICD-10-CM

## 2015-11-11 DIAGNOSIS — M5136 Other intervertebral disc degeneration, lumbar region: Secondary | ICD-10-CM | POA: Diagnosis not present

## 2015-11-11 DIAGNOSIS — M47812 Spondylosis without myelopathy or radiculopathy, cervical region: Secondary | ICD-10-CM

## 2015-11-11 DIAGNOSIS — M47816 Spondylosis without myelopathy or radiculopathy, lumbar region: Secondary | ICD-10-CM

## 2015-11-11 DIAGNOSIS — M5030 Other cervical disc degeneration, unspecified cervical region: Secondary | ICD-10-CM

## 2015-11-11 DIAGNOSIS — M19011 Primary osteoarthritis, right shoulder: Secondary | ICD-10-CM

## 2015-11-11 MED ORDER — OXYCODONE-ACETAMINOPHEN 10-325 MG PO TABS: ORAL_TABLET | ORAL | Status: DC

## 2015-11-11 NOTE — Progress Notes (Signed)
Safety precautions to be maintained throughout the outpatient stay will include: orient to surroundings, keep bed in low position, maintain call bell within reach at all times, provide assistance with transfer out of bed and ambulation.  

## 2015-11-11 NOTE — Patient Instructions (Signed)
PLAN   Continue present medication oxycodone acetaminophen   F/U PCP Dr. Delton PrairiePaul Tobin for evaliation of  BP and general medical  condition  F/U surgical evaluation. May consider pending follow-up evaluations  F/U neurological evaluation. May consider PNCV/EMG studies and other studies pending follow-up evaluations  May consider radiofrequency rhizolysis or intraspinal procedures pending response to present treatment and F/U evaluation   Patient to call [facility] Management Center should patient have concerns prior to scheduled return appointment.

## 2015-11-12 NOTE — Progress Notes (Signed)
   Subjective:    Patient ID: Katrina Holmes, female    DOB: 1955-08-06, 60 y.o.   MRN: [id number]  HPI  The patient is a 60 year old female who returns to pain management for further evaluation and treatment of pain involving the neck entire back upper and lower extremity regions. The patient recently has moved [place] residence to another resident and has had some exacerbation of her pain. The patient stated that she has benefited significantly from interventional treatment and states that the lumbar facet, medial branch nerve blocks were of significant benefit in terms of reducing her pain. At the present time we will continue to observe patient's response to treatment regimen and we'll consider patient for interventional treatment as well as additional modifications of treatment pending response to treatment and follow-up evaluation. The patient has also been considered for additional surgical evaluation which patient prefers to avoid at this time. The patient is to call pain management prior to scheduled return appointment should there be significant change in condition. All agreed to suggested treatment plan        Review of Systems     Objective:   Physical Exam  Palpation of the splenius capitis and occipitalis musculature regions reproduce mild to moderate discomfort with mild to moderate tenderness of the cervical and thoracic facet regions. Palpation over the region of the thoracic region was with no crepitus of the thoracic region. Palpation of the acromioclavicular and glenohumeral joint regions reproduce moderate discomfort with moderate muscle spasms noted in the thoracic paraspinal musculature region. The patient appeared to be with bilaterally equal grip strength without increased pain with Tinel and Phalen's maneuver. Palpation of the lower thoracic region was with moderate tenderness to palpation with palpation over the lumbar paraspinal musculature region reproducing  moderately severe discomfort with rotation lateral bending and extension and palpation over the lumbar facet region. Palpation over the region of the PSIS and PII S regions reproduced moderate discomfort and there was mild to moderate tenderness of the greater trochanteric region and iliotibial band region. Straight leg raise was tolerates approximately 30 without increased pain with dorsiflexion noted. The knees were with tenderness to palpation with negative anterior and posterior drawer signs without ballottement of the patella. DTRs appeared to be trace at the knees and no sensory deficit or dermatomal dystrophy detected. There was negative clonus negative Homans. Abdomen was nontender with no costovertebral tenderness noted      Assessment & Plan:      Degenerative disc disease lumbar spine L3 -4 broad-based posterior disc bulge with thickening of the ligamentum flavum L4-5 posterior disc bulge facet arthropathy and thickening of the ligamentum flavum resulting in moderate canal stenosis and moderate neural foraminal narrowing. L5-S1 broad-based disc bulge posteriorly thickening of the ligamentum  flavum resulting in spinal stenosis and moderate bilateral neural foraminal stenosis  Lumbar stenosis with neurogenic claudication  Lumbar facet syndrome  Sacroiliac joint dysfunction    PLAN   Continue present medication oxycodone acetaminophen   F/U PCP Dr. Delton PrairiePaul Tobin for evaliation of  BP and general medical  condition  F/U surgical evaluation. May consider pending follow-up evaluations as discussed again  F/U neurological evaluation. May consider PNCV/EMG studies and other studies pending follow-up evaluations  May consider radiofrequency rhizolysis or intraspinal procedures pending response to present treatment and F/U evaluation   Patient to call Pain Management Center should patient have concerns prior to scheduled return appointment.

## 2015-11-19 LAB — TOXASSURE SELECT 13 (MW), URINE

## 2015-12-14 ENCOUNTER — Ambulatory Visit: Payer: BLUE CROSS/BLUE SHIELD | Attending: Pain Medicine | Admitting: Pain Medicine

## 2015-12-14 ENCOUNTER — Encounter: Payer: Self-pay | Admitting: Pain Medicine

## 2015-12-14 VITALS — BP 131/83 | HR 81 | Temp 98.0°F | Resp 15 | Ht 66.0 in | Wt 130.0 lb

## 2015-12-14 DIAGNOSIS — M5030 Other cervical disc degeneration, unspecified cervical region: Secondary | ICD-10-CM

## 2015-12-14 DIAGNOSIS — M47812 Spondylosis without myelopathy or radiculopathy, cervical region: Secondary | ICD-10-CM

## 2015-12-14 DIAGNOSIS — M5136 Other intervertebral disc degeneration, lumbar region: Secondary | ICD-10-CM | POA: Insufficient documentation

## 2015-12-14 DIAGNOSIS — M47816 Spondylosis without myelopathy or radiculopathy, lumbar region: Secondary | ICD-10-CM

## 2015-12-14 DIAGNOSIS — M48062 Spinal stenosis, lumbar region with neurogenic claudication: Secondary | ICD-10-CM

## 2015-12-14 DIAGNOSIS — M4806 Spinal stenosis, lumbar region: Secondary | ICD-10-CM | POA: Insufficient documentation

## 2015-12-14 DIAGNOSIS — M1288 Other specific arthropathies, not elsewhere classified, other specified site: Secondary | ICD-10-CM | POA: Insufficient documentation

## 2015-12-14 DIAGNOSIS — M5126 Other intervertebral disc displacement, lumbar region: Secondary | ICD-10-CM | POA: Diagnosis not present

## 2015-12-14 DIAGNOSIS — M533 Sacrococcygeal disorders, not elsewhere classified: Secondary | ICD-10-CM | POA: Insufficient documentation

## 2015-12-14 DIAGNOSIS — M19011 Primary osteoarthritis, right shoulder: Secondary | ICD-10-CM

## 2015-12-14 DIAGNOSIS — Z9889 Other specified postprocedural states: Secondary | ICD-10-CM

## 2015-12-14 DIAGNOSIS — M546 Pain in thoracic spine: Secondary | ICD-10-CM | POA: Diagnosis present

## 2015-12-14 DIAGNOSIS — M62830 Muscle spasm of back: Secondary | ICD-10-CM | POA: Insufficient documentation

## 2015-12-14 DIAGNOSIS — M542 Cervicalgia: Secondary | ICD-10-CM | POA: Diagnosis present

## 2015-12-14 DIAGNOSIS — M545 Low back pain: Secondary | ICD-10-CM | POA: Diagnosis present

## 2015-12-14 MED ORDER — OXYCODONE-ACETAMINOPHEN 10-325 MG PO TABS: ORAL_TABLET | ORAL | Status: DC

## 2015-12-14 NOTE — Progress Notes (Signed)
Safety precautions to be maintained throughout the outpatient stay will include: orient to surroundings, keep bed in low position, maintain call bell within reach at all times, provide assistance with transfer out of bed and ambulation.  

## 2015-12-14 NOTE — Progress Notes (Signed)
Subjective:    Patient ID: Katrina Holmes, female    DOB: 05/28/1956, 60 y.o.   MRN: [id number]  HPI  The patient is a 60 year old female who returns to pain management for further evaluation and treatment of pain involving the mid and lower back lower extremity region with pain of the region of the neck of mild degree. The patient is living any new residence and states that the arm more things to be done around the house. The patient denies any trauma change in events of daily living the call significant change in symptomatology. The patient states that the pain of the lumbar lower extremity regions has improved with prior interventional treatment and that the pain is beginning to return to some degree. The patient states the pain is aggravated with standing walking twisting turning maneuvers. We will continue medications as prescribed this time and will consider patient for modifications of treatment regimen pending follow-up evaluations.. We will continue medications consisting of oxycodone acetaminophen at this time.. All agreed to suggested treatment plan.     Review of Systems     Objective:   Physical Exam  There was tenderness of the paraspinal musculature region cervical region cervical facet region with tenderness over the region of the splenius capitis and occipitalis regions of mild degree to moderate degree. Palpation of the acromioclavicular and glenohumeral joint regions reproduce moderate discomfort. There was moderate muscle spasm involving the cervical paraspinal musculature region and thoracic paraspinal musculature region. Palpation of the acromioclavicular and glenohumeral joint regions reproduce moderate discomfort and patient appeared to be with unremarkable Spurling's maneuver. The patient appeared to be with bilaterally equal grip strength with Tinel and Phalen's maneuver reproducing minimal discomfort. There was no crepitus of the thoracic region noted. Palpation over the  lumbar region lumbar paraspinal musculature region was attends to palpation of moderate degree with lateral bending rotation extension and palpation of the lumbar facets reproducing moderate discomfort. Palpation over the PSIS and PII S region reproduced moderate discomfort. There was increase of pain of moderate degree with pressure applied to the ileum with patient in lateral decubitus position. There was mild tenderness of the greater trochanteric region iliotibial band region. Straight leg raise was tolerates approximately 20 without a definite increased pain with dorsiflexion noted. EHL strength appeared to be slightly decreased. DTRs appeared to be trace at the knees. There was negative clonus negative Homans. Abdomen was nontender with no costovertebral tenderness noted      Assessment & Plan:    Degenerative disc disease lumbar spine L3 -4 broad-based posterior disc bulge with thickening of the ligamentum flavum L4-5 posterior disc bulge facet arthropathy and thickening of the ligamentum flavum resulting in moderate canal stenosis and moderate neural foraminal narrowing. L5-S1 broad-based disc bulge posteriorly thickening of the ligamentum  flavum resulting in spinal stenosis and moderate bilateral neural foraminal stenosis  Lumbar stenosis with neurogenic claudication  Lumbar facet syndrome  Sacroiliac joint dysfunction      PLAN  Continue present medication oxycodone acetaminophen  F/U PCP for evaluation of  BP and general medical  condition  F/U surgical evaluation. May consider pending follow-up evaluations. Patient without plans for further surgical evaluation for surgical intervention at this time  F/U neurological evaluation. May consider PNCV/EMG studies and other studies pending follow-up evaluations  May consider radiofrequency rhizolysis or intraspinal procedures pending response to present treatment and F/U evaluation   Patient to call Pain Management Center  should patient have concerns prior to scheduled return appointment.

## 2015-12-14 NOTE — Patient Instructions (Signed)
PLAN  Continue present medication oxycodone acetaminophen  F/U PCP for evaluation of  BP and general medical  condition  F/U surgical evaluation. May consider pending follow-up evaluations. Patient without plans for further surgical evaluation for surgical intervention at this time  F/U neurological evaluation. May consider PNCV/EMG studies and other studies pending follow-up evaluations  May consider radiofrequency rhizolysis or intraspinal procedures pending response to present treatment and F/U evaluation   Patient to call Pain Management Center should patient have concerns prior to scheduled return appointment.

## 2015-12-23 LAB — TOXASSURE SELECT 13 (MW), URINE: PDF: 0

## 2015-12-28 ENCOUNTER — Encounter: Payer: Self-pay | Admitting: Emergency Medicine

## 2015-12-28 ENCOUNTER — Emergency Department
Admission: EM | Admit: 2015-12-28 | Discharge: 2015-12-29 | Disposition: A | Discharge status: Home / Self Care | Payer: BLUE CROSS/BLUE SHIELD | Attending: Emergency Medicine | Admitting: Emergency Medicine

## 2015-12-28 DIAGNOSIS — Z79899 Other long term (current) drug therapy: Secondary | ICD-10-CM | POA: Insufficient documentation

## 2015-12-28 DIAGNOSIS — I10 Essential (primary) hypertension: Secondary | ICD-10-CM | POA: Insufficient documentation

## 2015-12-28 DIAGNOSIS — F329 Major depressive disorder, single episode, unspecified: Secondary | ICD-10-CM | POA: Diagnosis not present

## 2015-12-28 DIAGNOSIS — F332 Major depressive disorder, recurrent severe without psychotic features: Secondary | ICD-10-CM

## 2015-12-28 DIAGNOSIS — Z7982 Long term (current) use of aspirin: Secondary | ICD-10-CM | POA: Insufficient documentation

## 2015-12-28 DIAGNOSIS — R45851 Suicidal ideations: Secondary | ICD-10-CM

## 2015-12-28 DIAGNOSIS — F17210 Nicotine dependence, cigarettes, uncomplicated: Secondary | ICD-10-CM | POA: Insufficient documentation

## 2015-12-28 DIAGNOSIS — F32A Depression, unspecified: Secondary | ICD-10-CM

## 2015-12-28 HISTORY — DX: Depression, unspecified: F32.A

## 2015-12-28 HISTORY — DX: Major depressive disorder, single episode, unspecified: F32.9

## 2015-12-28 HISTORY — DX: Suicide attempt, initial encounter: T14.91XA

## 2015-12-28 LAB — CBC
HEMATOCRIT: 38.5 % (ref 35.0–47.0)
HEMOGLOBIN: 13.3 g/dL (ref 12.0–16.0)
MCH: 30.4 pg (ref 26.0–34.0)
MCHC: 34.5 g/dL (ref 32.0–36.0)
MCV: 88 fL (ref 80.0–100.0)
Platelets: 598 10*3/uL — ABNORMAL HIGH (ref 150–440)
RBC: 4.38 MIL/uL (ref 3.80–5.20)
RDW: 14.4 % (ref 11.5–14.5)
WBC: 7.4 10*3/uL (ref 3.6–11.0)

## 2015-12-28 LAB — COMPREHENSIVE METABOLIC PANEL
ALBUMIN: 4 g/dL (ref 3.5–5.0)
ALK PHOS: 99 U/L (ref 38–126)
ALT: 21 U/L (ref 14–54)
ANION GAP: 7 (ref 5–15)
AST: 23 U/L (ref 15–41)
BUN: 13 mg/dL (ref 6–20)
CALCIUM: 9.5 mg/dL (ref 8.9–10.3)
CHLORIDE: 104 mmol/L (ref 101–111)
CO2: 27 mmol/L (ref 22–32)
Creatinine, Ser: 0.67 mg/dL (ref 0.44–1.00)
GFR calc Af Amer: >60 mL/min (ref >60)
GFR calc non Af Amer: >60 mL/min (ref >60)
GLUCOSE: 90 mg/dL (ref 65–99)
Potassium: 4.1 mmol/L (ref 3.5–5.1)
SODIUM: 138 mmol/L (ref 135–145)
Total Bilirubin: 0.4 mg/dL (ref 0.3–1.2)
Total Protein: 7.8 g/dL (ref 6.5–8.1)

## 2015-12-28 LAB — URINE DRUG SCREEN, QUALITATIVE (ARMC ONLY)
AMPHETAMINES, UR SCREEN: NOT DETECTED
Barbiturates, Ur Screen: NOT DETECTED
Benzodiazepine, Ur Scrn: POSITIVE — AB
COCAINE METABOLITE, UR ~~LOC~~: NOT DETECTED
Cannabinoid 50 Ng, Ur ~~LOC~~: NOT DETECTED
MDMA (ECSTASY) UR SCREEN: NOT DETECTED
METHADONE SCREEN, URINE: NOT DETECTED
OPIATE, UR SCREEN: POSITIVE — AB
Phencyclidine (PCP) Ur S: NOT DETECTED
Tricyclic, Ur Screen: NOT DETECTED

## 2015-12-28 LAB — ACETAMINOPHEN LEVEL: ACETAMINOPHEN (TYLENOL), SERUM: 13 ug/mL (ref 10–30)

## 2015-12-28 LAB — SALICYLATE LEVEL: Salicylate Lvl: <4 mg/dL (ref 2.8–30.0)

## 2015-12-28 LAB — ETHANOL: Alcohol, Ethyl (B): <5 mg/dL (ref <5)

## 2015-12-28 MED ORDER — ASPIRIN EC 81 MG PO TBEC
81.0000 mg | DELAYED_RELEASE_TABLET | Freq: Every day | ORAL | Status: DC
  Administered 2015-12-29: 81 mg via ORAL
  Filled 2015-12-28: qty 1

## 2015-12-28 MED ORDER — PANTOPRAZOLE SODIUM 40 MG PO TBEC
40.0000 mg | DELAYED_RELEASE_TABLET | Freq: Two times a day (BID) | ORAL | Status: DC | PRN
  Administered 2015-12-29: 40 mg via ORAL
  Filled 2015-12-28: qty 1

## 2015-12-28 MED ORDER — LORATADINE 10 MG PO TABS
10.0000 mg | ORAL_TABLET | Freq: Every day | ORAL | Status: DC
  Administered 2015-12-29: 10 mg via ORAL
  Filled 2015-12-28: qty 1

## 2015-12-28 MED ORDER — METOPROLOL TARTRATE 25 MG PO TABS
50.0000 mg | ORAL_TABLET | Freq: Every day | ORAL | Status: DC
  Administered 2015-12-29: 50 mg via ORAL
  Filled 2015-12-28: qty 2

## 2015-12-28 MED ORDER — DIAZEPAM 5 MG PO TABS
5.0000 mg | ORAL_TABLET | Freq: Three times a day (TID) | ORAL | Status: DC | PRN
  Administered 2015-12-28 – 2015-12-29 (×2): 5 mg via ORAL
  Filled 2015-12-28 (×2): qty 1

## 2015-12-28 MED ORDER — ACETAMINOPHEN 500 MG PO TABS: 1000.0000 mg | ORAL_TABLET | Freq: Three times a day (TID) | ORAL | Status: DC | PRN

## 2015-12-28 MED ORDER — DICYCLOMINE HCL 10 MG PO CAPS
20.0000 mg | ORAL_CAPSULE | Freq: Four times a day (QID) | ORAL | Status: DC | PRN
  Filled 2015-12-28: qty 2

## 2015-12-28 NOTE — ED Notes (Addendum)
Pt to ed with c/o suicidal thoughts, hx of suicide attempt in the past, reports last psychiatric hospitalization was in the 1990's.  Pt states she is currently not on meds for SI or depression.  Reports tearful and sad recently.  Denies HI. Family with pt.
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
## 2015-12-28 NOTE — ED Provider Notes (Signed)
[facility] Emergency Department Provider Note  ____________________________________________  Time seen: Approximately 6:34 PM  I have reviewed the triage vital signs and the nursing notes.   HISTORY  Chief Complaint Suicidal   HPI Katrina Holmes is a 60 y.o. female history of anxiety, depression, and prior substance suicide attempt in 1990 presents for evaluation of suicidal ideation. Patient reports progressively worsening depressive symptoms over the course of the last 6 months. For the last 4 days she has been having suicidal thoughts with the plan of jumping front of him coming traffic or stabbing herself in the heart with a butcher knife. Patient try to kill herself one time in the past requiring psychiatric hospitalization in the 90s. At that time she slit her wrists patient reports that she recently lost her mother and mother-in-law which are the stressors for his current depression. She reports strong family history of depression and anxiety. She reports she was on Celexa until year ago however the medication sedate her too much. She does not have a psychiatrist. She also takes Valium for anxiety. She takes Percocet for chronic back and neck pain. She denies alcohol or illicit drug use. She denies homicidal ideation or hallucinations.  Past Medical History  Diagnosis Date  . Anxiety   . Allergy   . Hypertension   . Depression   . Suicide attempt [facility](HCC)     Patient Active Problem List   Diagnosis Date Noted  . DDD (degenerative disc disease), cervical 05/25/2015  . H/O cervical spine surgery 05/25/2015  . Cervical facet syndrome 05/25/2015  . 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 DDD (degenerative disc disease), lumbar 05/25/2015  . Facet syndrome, lumbar 05/25/2015    Past Surgical History  Procedure Laterality Date  . Neck surgery  2006  . Shoulder arthroscopy with rotator cuff repair Left     3 surgeries (2006-2007-2008)  . Abdominal hysterectomy       Current Outpatient Rx  Name  Route  Sig  Dispense  Refill  . amitriptyline (ELAVIL) 10 MG tablet   Oral   Take 10 mg by mouth at bedtime. Reported on 12/14/2015         . aspirin EC 81 MG tablet   Oral   Take 81 mg by mouth daily.         Marland Kitchen. dicyclomine (BENTYL) 20 MG tablet   Oral   Take 20 mg by mouth every 6 (six) hours as needed for spasms.         Marland Kitchen. loratadine (CLARITIN) 10 MG tablet   Oral   Take 10 mg by mouth daily.         . metoprolol succinate (TOPROL XL) 50 MG 24 hr tablet   Oral   Take 50 mg by mouth daily. Take with or immediately following a meal.         . Multiple Vitamin (MULTI-VITAMINS) TABS   Oral   Take 1 tablet by mouth daily.         Marland Kitchen. omeprazole (PRILOSEC) 20 MG capsule   Oral   Take 20 mg by mouth 2 (two) times daily as needed (for acid reflux).         . citalopram (CELEXA) 10 MG tablet   Oral   Take 10 mg by mouth daily. Reported on 12/28/2015         . diazepam (VALIUM) 5 MG tablet   Oral   Take 5 mg by mouth every 8 (eight) hours as  needed for anxiety. Reported on 12/28/2015           Allergies Nsaids and Penicillins  Family History  Problem Relation Age of Onset  . Heart disease Mother   . Stroke Father     Social History Social History  Substance Use Topics  . Smoking status: Current Every Day Smoker -- 0.50 packs/day    Types: Cigarettes  . Smokeless tobacco: None  . Alcohol Use: No    Review of Systems Constitutional: Negative for fever. Eyes: Negative for visual changes. ENT: Negative for sore throat. Cardiovascular: Negative for chest pain. Respiratory: Negative for shortness of breath. Gastrointestinal: Negative for abdominal pain, vomiting or diarrhea. Genitourinary: Negative for dysuria. Musculoskeletal: Negative for back pain. Skin: Negative for rash. Neurological: Negative for headaches, weakness or numbness. Psych: Suicidal ideation with plan,  depression  ____________________________________________   PHYSICAL EXAM:  VITAL SIGNS: ED Triage Vitals  Enc Vitals Group     BP 12/28/15 1820 116/80 mmHg     Pulse Rate 12/28/15 1820 88     Resp 12/28/15 1820 18     Temp 12/28/15 1820 98.1 F (36.7 C)     Temp Source 12/28/15 1820 Oral     SpO2 12/28/15 1820 98 %     Weight 12/28/15 1820 130 lb (58.968 kg)     Height 12/28/15 1820 5\' 6"  (1.676 m)     Head Cir --      Peak Flow --      Pain Score 12/28/15 1810 4     Pain Loc --      Pain Edu? --      Excl. in GC? --     Constitutional: Alert and oriented. Well appearing and in no apparent distress. HEENT:      Head: Normocephalic and atraumatic.         Eyes: Conjunctivae are normal. Sclera is non-icteric. EOMI. PERRL      Mouth/Throat: Mucous membranes are moist.       Neck: Supple with no signs of meningismus. Cardiovascular: Regular rate and rhythm. No murmurs, gallops, or rubs. 2+ symmetrical distal pulses are present in all extremities. No JVD. Respiratory: Normal respiratory effort. Lungs are clear to auscultation bilaterally. No wheezes, crackles, or rhonchi.  Gastrointestinal: Soft, non tender, and non distended with positive bowel sounds. No rebound or guarding. Musculoskeletal: Nontender with normal range of motion in all extremities. No edema, cyanosis, or erythema of extremities. Neurologic: Normal speech and language. Face is symmetric. Moving all extremities. No gross focal neurologic deficits are appreciated. Skin: Skin is warm, dry and intact. No rash noted. Psychiatric: Mood and affect are depressed. Teary. SI with plan. No HI, VH, AH ____________________________________________   LABS (all labs ordered are listed, but only abnormal results are displayed)  Labs Reviewed  CBC - Abnormal; Notable for the following:    Platelets 598 (*)    All other components within normal limits  URINE DRUG SCREEN, QUALITATIVE ([facility] ONLY) - Abnormal; Notable for the  following:    Opiate, Ur Screen POSITIVE (*)    Benzodiazepine, Ur Scrn POSITIVE (*)    All other components within normal limits  COMPREHENSIVE METABOLIC PANEL  ETHANOL  SALICYLATE LEVEL  ACETAMINOPHEN LEVEL   ____________________________________________  EKG  none ____________________________________________  RADIOLOGY  none  ____________________________________________   PROCEDURES  Procedure(s) performed: None Critical Care performed:  None ____________________________________________   INITIAL IMPRESSION / ASSESSMENT AND PLAN / ED COURSE   60 y.o. female history of anxiety, depression,  and prior substance suicide attempt in 1990 presents for evaluation of suicidal ideation in the setting of worsening depressive symptoms over the course of 6 months. Patient has no medical complaints. Her vital signs are within normal limits. Patient has an active plan and therefore IVC will be taken. Will consult psychiatry and TTS.  ----------------------------------------- 7:44 PM on 12/28/2015 -----------------------------------------  Patient medically cleared and awaiting for psychiatric evaluation.  Pertinent labs & imaging results that were available during my care of the patient were reviewed by me and considered in my medical decision making (see chart for details).    ____________________________________________   FINAL CLINICAL IMPRESSION(S) / ED DIAGNOSES  Final diagnoses:  Suicidal ideation  Depression      NEW MEDICATIONS STARTED DURING THIS VISIT:  New Prescriptions   No medications on file     Note:  This document was prepared using Dragon voice recognition software and may include unintentional dictation errors.    Nita Sickle, MD 12/28/15 1944

## 2015-12-28 NOTE — ED Notes (Signed)
Pt husband was given pt wedding ring set. Two white rings with one white stone in one of rings and several white stones in the other ring.

## 2015-12-29 DIAGNOSIS — F332 Major depressive disorder, recurrent severe without psychotic features: Secondary | ICD-10-CM

## 2015-12-29 MED ORDER — OXYCODONE-ACETAMINOPHEN 5-325 MG PO TABS
1.0000 | ORAL_TABLET | Freq: Once | ORAL | Status: AC
  Administered 2015-12-29: 1 via ORAL
  Filled 2015-12-29: qty 1

## 2015-12-29 MED ORDER — FLUOXETINE HCL 20 MG PO CAPS: 20.0000 mg | ORAL_CAPSULE | Freq: Every day | ORAL | Status: DC

## 2015-12-29 MED ORDER — OXYCODONE HCL 5 MG PO TABS
5.0000 mg | ORAL_TABLET | Freq: Once | ORAL | Status: AC
  Administered 2015-12-29: 5 mg via ORAL
  Filled 2015-12-29: qty 1

## 2015-12-29 NOTE — ED Notes (Signed)
Patient came out of room and to nurse station door and tried to open. This writer advised to step back from the door so I could exit. Patient states, "I want to leave now." This writer advised patient she could not leave right now because of being IVC. Patient states, "I will leave if I want, just watch me." Advised patient it would be best for her to go in her room and try to calm down. Patient told this Clinical research associatewriter, :"shut up and leave me the hell alone."

## 2015-12-29 NOTE — ED Notes (Signed)

## 2015-12-29 NOTE — ED Notes (Signed)
Pt dc to home with husband , she has no c/o and says she will make an appt with a therapist for her depression

## 2015-12-29 NOTE — ED Notes (Signed)

## 2015-12-29 NOTE — ED Provider Notes (Signed)
-----------------------------------------   3:03 PM on 12/29/2015 -----------------------------------------   Blood pressure 127/76, pulse 73, temperature 97.8 F (36.6 C), temperature source Oral, resp. rate 18, height 5\' 6"  (1.676 m), weight 130 lb (58.968 kg), SpO2 98 %.  The patient had no acute events since last update.  Calm and cooperative at this time.  Disposition is pending per Psychiatry/Behavioral Medicine team recommendations.   Patient has been released from involuntary commitment per psychiatry. Patient be discharged with appropriate follow-up. Medications be provided by the psychiatrist.  Jennye MoccasinBrian S Quigley, MD 12/29/15 [phone]

## 2015-12-29 NOTE — ED Notes (Addendum)
Pt. To B[facility] from ED ambulatory without difficulty, to room  [facility]. [facility] Pt. Is alert and oriented, warm and dry in no distress. Pt. Denies SI, HI. Patient reports hearing voices since her mother has passed away.  Pt. Calm and cooperative. Pt. Made aware of security cameras and Q15 minute rounds. Pt. Encouraged to let Nursing staff know of any concerns or needs.

## 2015-12-29 NOTE — Discharge Instructions (Signed)
Major Depressive Disorder Major depressive disorder is a mental illness. It also may be called clinical depression or unipolar depression. Major depressive disorder usually causes feelings of sadness, hopelessness, or helplessness. Some people with this disorder do not feel particularly sad but lose interest in doing things they used to enjoy (anhedonia). Major depressive disorder also can cause physical symptoms. It can interfere with work, school, relationships, and other normal everyday activities. The disorder varies in severity but is longer lasting and more serious than the sadness we all feel from time to time in our lives. Major depressive disorder often is triggered by stressful life events or major life changes. Examples of these triggers include divorce, loss of your job or home, a move, and the death of a family member or close friend. Sometimes this disorder occurs for no obvious reason at all. People who have family members with major depressive disorder or bipolar disorder are at higher risk for developing this disorder, with or without life stressors. Major depressive disorder can occur at any age. It may occur just once in your life (single episode major depressive disorder). It may occur multiple times (recurrent major depressive disorder). SYMPTOMS People with major depressive disorder have either anhedonia or depressed mood on nearly a daily basis for at least 2 weeks or longer. Symptoms of depressed mood include:  Feelings of sadness (blue or down in the dumps) or emptiness.  Feelings of hopelessness or helplessness.  Tearfulness or episodes of crying (may be observed by others).  Irritability (children and adolescents). In addition to depressed mood or anhedonia or both, people with this disorder have at least four of the following symptoms:  Difficulty sleeping or sleeping too much.   Significant change (increase or decrease) in appetite or weight.   Lack of energy or  motivation.  Feelings of guilt and worthlessness.   Difficulty concentrating, remembering, or making decisions.  Unusually slow movement (psychomotor retardation) or restlessness (as observed by others).   Recurrent wishes for death, recurrent thoughts of self-harm (suicide), or a suicide attempt. People with major depressive disorder commonly have persistent negative thoughts about themselves, other people, and the world. People with severe major depressive disorder may experiencedistorted beliefs or perceptions about the world (psychotic delusions). They also may see or hear things that are not real (psychotic hallucinations). DIAGNOSIS Major depressive disorder is diagnosed through an assessment by your health care provider. Your health care provider will ask aboutaspects of your daily life, such as mood,sleep, and appetite, to see if you have the diagnostic symptoms of major depressive disorder. Your health care provider may ask about your medical history and use of alcohol or drugs, including prescription medicines. Your health care provider also may do a physical exam and blood work. This is because certain medical conditions and the use of certain substances can cause major depressive disorder-like symptoms (secondary depression). Your health care provider also may refer you to a mental health specialist for further evaluation and treatment. TREATMENT It is important to recognize the symptoms of major depressive disorder and seek treatment. The following treatments can be prescribed for this disorder:   Medicine. Antidepressant medicines usually are prescribed. Antidepressant medicines are thought to correct chemical imbalances in the brain that are commonly associated with major depressive disorder. Other types of medicine may be added if the symptoms do not respond to antidepressant medicines alone or if psychotic delusions or hallucinations occur.  Talk therapy. Talk therapy can be  helpful in treating major depressive disorder by providing   support, education, and guidance. Certain types of talk therapy also can help with negative thinking (cognitive behavioral therapy) and with relationship issues that trigger this disorder (interpersonal therapy). A mental health specialist can help determine which treatment is best for you. Most people with major depressive disorder do well with a combination of medicine and talk therapy. Treatments involving electrical stimulation of the brain can be used in situations with extremely severe symptoms or when medicine and talk therapy do not work over time. These treatments include electroconvulsive therapy, transcranial magnetic stimulation, and vagal nerve stimulation.   This information is not intended to replace advice given to you by your health care provider. Make sure you discuss any questions you have with your health care provider.   Document Released: 09/30/2012 Document Revised: 06/26/2014 Document Reviewed: 09/30/2012 Elsevier Interactive Patient Education 2016 [employer]  

## 2015-12-29 NOTE — ED Notes (Signed)
Pt is upset upon waking up she states she feels much better and can not understand why she can not go home. ivc process explained

## 2015-12-29 NOTE — Consult Note (Signed)
[place] [facility] Consult   Reason for Consult:  Consult for 60 year old woman with a history of depression presented to the emergency room saying she was having suicidal thoughts Referring Physician:  Edd Fabian Patient Identification: Katrina Holmes MRN:  [id number] Principal Diagnosis: Severe recurrent major depression without psychotic features [facility]) Diagnosis:   Patient Active Problem List   Diagnosis Date Noted  . Severe recurrent major depression without psychotic features ([place]) [F33.2] 12/29/2015  . Chronic pain [G89.29] 12/29/2015  . Involuntary commitment [Z04.6] 12/29/2015  . DDD (degenerative disc disease), cervical [M50.30] 05/25/2015  . H/O cervical spine surgery [Z98.890] 05/25/2015  . Cervical facet syndrome [M53.82] 05/25/2015  . 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 DDD (degenerative disc disease), lumbar [M51.36] 05/25/2015  . Facet syndrome, lumbar [M54.5] 05/25/2015    Total Time spent with patient: 1 hour  Subjective:   Katrina Holmes is a 60 y.o. female patient admitted with "it's been building up for a while".  HPI:  Patient interviewed. Chart reviewed. Labs and vitals reviewed. Patient reports she is been getting more depressed for several months. She's had several losses of loved ones over the past year most recently her mother passing away in June. Her mood has been getting more and more depressed and down. She also feels anxious and stressed out. She has trouble sleeping well at night. Her chronic back pain is bothering her more than usual. Patient feels sad and irritable. Still has positive appreciation for things such as her relationship with her children and grandchildren. Not having active psychotic symptoms. She was having some suicidal thoughts but did not have a specific plan or intent. On interview today the patient says that she feels certain that she is not going to act on it because of her positive hopes for the future. She is not currently  getting any outpatient psychiatric treatment although she plans to see her primary care doctor. Not abusing drugs or alcohol.  Social history: Patient is retired. Lives with her husband. Husband is also retired. This gets on her nerves because she doesn't like having around all day. Relationship with her children and grandchildren.  Medical history: Multiple back problems with chronic pain  Substance abuse history: Denies alcohol or drug abuse currently or in the past.  Past Psychiatric History: Patient reports she was treated for depression back in the 1990s. [facility] She was on Prozac and remembers that it was helpful. She claims that she never seriously tried to kill her self. Has not been on antidepressants in years.  Risk to Self: Suicidal Ideation: Yes-Currently Present Suicidal Intent: Yes-Currently Present Is patient at risk for suicide?: Yes Suicidal Plan?: Yes-Currently Present Specify Current Suicidal Plan: Jump in front of a bus Access to Means: No (Currently in the hospital) What has been your use of drugs/alcohol within the last 12 months?: No recent use How many times?: 1 Other Self Harm Risks: Denied Triggers for Past Attempts: Unknown Intentional Self Injurious Behavior: None Risk to Others: Homicidal Ideation: No Thoughts of Harm to Others: No Current Homicidal Intent: No Current Homicidal Plan: No Access to Homicidal Means: No Identified Victim: None identified History of harm to others?: No Assessment of Violence: None Noted Violent Behavior Description: denied Does patient have access to weapons?: No Criminal Charges Pending?: No Does patient have a court date: No Prior Inpatient Therapy: Prior Inpatient Therapy: Yes Prior Therapy Dates: Late 1990's, Early 2000 Prior Therapy Facilty/Provider(s): [facility] Reason for  Treatment: Substance abuse Prior Outpatient Therapy: Prior Outpatient Therapy: Yes Prior Therapy  Dates: Early 2000s Prior Therapy Facilty/Provider(s): [facility] Reason for Treatment: Depression Does patient have an ACCT team?: No Does patient have Intensive In-House Services?  : No Does patient have [facility] services? : No Does patient have P4CC services?: No  Past Medical History:  Past Medical History  Diagnosis Date  . Anxiety   . Allergy   . Hypertension   . Depression   . Suicide attempt [facility])     Past Surgical History  Procedure Laterality Date  . Neck surgery  2006  . Shoulder arthroscopy with rotator cuff repair Left     3 surgeries (2006-2007-2008)  . Abdominal hysterectomy     Family History:  Family History  Problem Relation Age of Onset  . Heart disease Mother   . Stroke Father    Family Psychiatric  History: Patient says her mother and grandmother both had mood instability problems Social History:  History  Alcohol Use No     History  Drug Use No    Social History   Social History  . Marital Status: Married    Spouse Name: N/A  . Number of Children: N/A  . Years of Education: N/A   Social History Main Topics  . Smoking status: Current Every Day Smoker -- 0.50 packs/day    Types: Cigarettes  . Smokeless tobacco: None  . Alcohol Use: No  . Drug Use: No  . Sexual Activity: Not Asked   Other Topics Concern  . None   Social History Narrative   Additional Social History:    Allergies:   Allergies  Allergen Reactions  . Nsaids Nausea And Vomiting  . Penicillins Rash    Labs:  Results for orders placed or performed during the hospital encounter of 12/28/15 (from the past 48 hour(s))  Comprehensive metabolic panel     Status: None   Collection Time: 12/28/15  6:19 PM  Result Value Ref Range   Sodium 138 135 - 145 mmol/L   Potassium 4.1 3.5 - 5.1 mmol/L   Chloride 104 101 - 111 mmol/L   CO2 27 22 - 32 mmol/L   Glucose, Bld 90 65 - 99 mg/dL   BUN 13 6 - 20 mg/dL   Creatinine, Ser 0.67 0.44 - 1.00 mg/dL   Calcium 9.5  8.9 - 10.3 mg/dL   Total Protein 7.8 6.5 - 8.1 g/dL   Albumin 4.0 3.5 - 5.0 g/dL   AST 23 15 - 41 U/L   ALT 21 14 - 54 U/L   Alkaline Phosphatase 99 38 - 126 U/L   Total Bilirubin 0.4 0.3 - 1.2 mg/dL   GFR calc non Af Amer >60 >60 mL/min   GFR calc Af Amer >60 >60 mL/min    Comment: (NOTE) The eGFR has been calculated using the CKD EPI equation. This calculation has not been validated in all clinical situations. eGFR's persistently <60 mL/min signify possible Chronic Kidney Disease.    Anion gap 7 5 - 15  Ethanol     Status: None   Collection Time: 12/28/15  6:19 PM  Result Value Ref Range   Alcohol, Ethyl (B) <5 <5 mg/dL    Comment:        LOWEST DETECTABLE LIMIT FOR SERUM ALCOHOL IS 5 mg/dL FOR MEDICAL PURPOSES ONLY   Salicylate level     Status: None   Collection Time: 12/28/15  6:19 PM  Result Value Ref Range  Salicylate Lvl <4.0 2.8 - 30.0 mg/dL  Acetaminophen level     Status: None   Collection Time: 12/28/15  6:19 PM  Result Value Ref Range   Acetaminophen (Tylenol), Serum 13 10 - 30 ug/mL    Comment:        THERAPEUTIC CONCENTRATIONS VARY SIGNIFICANTLY. A RANGE OF 10-30 ug/mL MAY BE AN EFFECTIVE CONCENTRATION FOR MANY PATIENTS. HOWEVER, SOME ARE BEST TREATED AT CONCENTRATIONS OUTSIDE THIS RANGE. ACETAMINOPHEN CONCENTRATIONS >150 ug/mL AT 4 HOURS AFTER INGESTION AND >50 ug/mL AT 12 HOURS AFTER INGESTION ARE OFTEN ASSOCIATED WITH TOXIC REACTIONS.   cbc     Status: Abnormal   Collection Time: 12/28/15  6:19 PM  Result Value Ref Range   WBC 7.4 3.6 - 11.0 K/uL   RBC 4.38 3.80 - 5.20 MIL/uL   Hemoglobin 13.3 12.0 - 16.0 g/dL   HCT 38.5 35.0 - 47.0 %   MCV 88.0 80.0 - 100.0 fL   MCH 30.4 26.0 - 34.0 pg   MCHC 34.5 32.0 - 36.0 g/dL   RDW 14.4 11.5 - 14.5 %   Platelets 598 (H) 150 - 440 K/uL  Urine Drug Screen, Qualitative     Status: Abnormal   Collection Time: 12/28/15  6:19 PM  Result Value Ref Range   Tricyclic, Ur Screen NONE DETECTED NONE DETECTED    Amphetamines, Ur Screen NONE DETECTED NONE DETECTED   MDMA (Ecstasy)Ur Screen NONE DETECTED NONE DETECTED   Cocaine Metabolite,Ur [place] NONE DETECTED NONE DETECTED   Opiate, Ur Screen POSITIVE (A) NONE DETECTED   Phencyclidine (PCP) Ur S NONE DETECTED NONE DETECTED   Cannabinoid 50 Ng, Ur [place] NONE DETECTED NONE DETECTED   Barbiturates, Ur Screen NONE DETECTED NONE DETECTED   Benzodiazepine, Ur Scrn POSITIVE (A) NONE DETECTED   Methadone Scn, Ur NONE DETECTED NONE DETECTED    Comment: (NOTE) 973  Tricyclics, urine               Cutoff 1000 ng/mL 200  Amphetamines, urine             Cutoff 1000 ng/mL 300  MDMA (Ecstasy), urine           Cutoff 500 ng/mL 400  Cocaine Metabolite, urine       Cutoff 300 ng/mL 500  Opiate, urine                   Cutoff 300 ng/mL 600  Phencyclidine (PCP), urine      Cutoff 25 ng/mL 700  Cannabinoid, urine              Cutoff 50 ng/mL 800  Barbiturates, urine             Cutoff 200 ng/mL 900  Benzodiazepine, urine           Cutoff 200 ng/mL 1000 Methadone, urine                Cutoff 300 ng/mL 1100 1200 The urine drug screen provides only a preliminary, unconfirmed 1300 analytical test result and should not be used for non-medical 1400 purposes. Clinical consideration and professional judgment should 1500 be applied to any positive drug screen result due to possible 1600 interfering substances. A more specific alternate chemical method 1700 must be used in order to obtain a confirmed analytical result.  1800 Gas chromato graphy / mass spectrometry (GC/MS) is the preferred 1900 confirmatory method.     Current Facility-Administered Medications  Medication Dose Route Frequency Provider Last Rate Last Dose  .  acetaminophen (TYLENOL) tablet 1,000 mg  1,000 mg Oral Q8H PRN Nita Sickle, MD      . aspirin EC tablet 81 mg  81 mg Oral Daily Nita Sickle, MD   81 mg at 12/29/15 0915  . diazepam (VALIUM) tablet 5 mg  5 mg Oral Q8H PRN Nita Sickle,  MD   5 mg at 12/29/15 0545  . dicyclomine (BENTYL) capsule 20 mg  20 mg Oral Q6H PRN Nita Sickle, MD      . lactated ringers infusion 1,000 mL  1,000 mL Intravenous Continuous Ewing Schlein, MD      . loratadine (CLARITIN) tablet 10 mg  10 mg Oral Daily Nita Sickle, MD   10 mg at 12/29/15 0915  . metoprolol (LOPRESSOR) tablet 50 mg  50 mg Oral Daily Nita Sickle, MD   50 mg at 12/29/15 0915  . pantoprazole (PROTONIX) EC tablet 40 mg  40 mg Oral BID PRN Nita Sickle, MD   40 mg at 12/29/15 0915   Current Outpatient Prescriptions  Medication Sig Dispense Refill  . amitriptyline (ELAVIL) 10 MG tablet Take 10 mg by mouth at bedtime. Reported on 12/14/2015    . aspirin EC 81 MG tablet Take 81 mg by mouth daily.    . diazepam (VALIUM) 5 MG tablet Take 5 mg by mouth every 8 (eight) hours as needed for anxiety. Reported on 12/28/2015    . dicyclomine (BENTYL) 20 MG tablet Take 20 mg by mouth every 6 (six) hours as needed for spasms.    Marland Kitchen loratadine (CLARITIN) 10 MG tablet Take 10 mg by mouth daily.    . metoprolol succinate (TOPROL XL) 50 MG 24 hr tablet Take 50 mg by mouth daily. Take with or immediately following a meal.    . Multiple Vitamin (MULTI-VITAMINS) TABS Take 1 tablet by mouth daily.    Marland Kitchen omeprazole (PRILOSEC) 20 MG capsule Take 20 mg by mouth 2 (two) times daily as needed (for acid reflux).    . citalopram (CELEXA) 10 MG tablet Take 10 mg by mouth daily. Reported on 12/28/2015    . FLUoxetine (PROZAC) 20 MG capsule Take 1 capsule (20 mg total) by mouth daily. 30 capsule 1    Musculoskeletal: Strength & Muscle Tone: within normal limits Gait & Station: normal Patient leans: N/A  Psychiatric Specialty Exam: Physical Exam  Nursing note and vitals reviewed. Constitutional: She appears well-developed and well-nourished.  HENT:  Head: Normocephalic and atraumatic.  Eyes: Conjunctivae are normal. Pupils are equal, round, and reactive to light.  Neck: Normal range of  motion.  Cardiovascular: Regular rhythm and normal heart sounds.   Respiratory: Effort normal. No respiratory distress.  GI: Soft.  Musculoskeletal: Normal range of motion.  Neurological: She is alert.  Skin: Skin is warm and dry.  Psychiatric: Her speech is normal and behavior is normal. Judgment normal. Cognition and memory are normal. She exhibits a depressed mood. She expresses no suicidal plans.    Review of Systems  Constitutional: Negative.   HENT: Negative.   Eyes: Negative.   Respiratory: Negative.   Cardiovascular: Negative.   Gastrointestinal: Negative.   Musculoskeletal: Positive for back pain.  Skin: Negative.   Neurological: Negative.   Psychiatric/Behavioral: Positive for depression and suicidal ideas. Negative for hallucinations, memory loss and substance abuse. The patient is nervous/anxious and has insomnia.     Blood pressure 127/76, pulse 73, temperature 97.8 F (36.6 C), temperature source Oral, resp. rate 18, height 5\' 6"  (1.676 m), weight 58.968 kg (130  lb), SpO2 98 %.Body mass index is 20.99 kg/(m^2).  General Appearance: Casual  Eye Contact:  Fair  Speech:  Clear and Coherent  Volume:  Normal  Mood:  Depressed  Affect:  Appropriate  Thought Process:  Goal Directed  Orientation:  Full (Time, Place, and Person)  Thought Content:  Logical  Suicidal Thoughts:  No  Homicidal Thoughts:  No  Memory:  Immediate;   Good Recent;   Fair Remote;   Fair  Judgement:  Fair  Insight:  Fair  Psychomotor Activity:  Decreased  Concentration:  Concentration: Fair  Recall:  [employer] of Knowledge:  Fair  Language:  Fair  Akathisia:  No  Handed:  Right  AIMS (if indicated):     Assets:  Communication Skills Desire for Improvement Housing Resilience Social Support  ADL's:  Intact  Cognition:  WNL  Sleep:        Treatment Plan Summary: Medication management and Plan 60 year old woman with multiple symptoms of depression. She has good insight and says that  she feels certain she would not act on doing anything to try and hurt herself. Not psychotic. Not abusing substances. Patient is agreeable to outpatient treatment. No longer meets commitment criteria. At her request I have agreed to give her a prescription to start fluoxetine 20 mg per day. She will follow-up with her primary care doctor and will also be given information about mental health resources in the community. Case reviewed with the ER doctor. IVC discontinued.  Disposition: Patient does not meet criteria for psychiatric inpatient admission.  Alethia Berthold, MD 12/29/2015 1:36 PM

## 2015-12-29 NOTE — ED Notes (Signed)
Pt resting better after meds

## 2016-01-13 ENCOUNTER — Ambulatory Visit: Payer: BLUE CROSS/BLUE SHIELD | Attending: Pain Medicine | Admitting: Pain Medicine

## 2016-01-13 ENCOUNTER — Encounter: Payer: Self-pay | Admitting: Pain Medicine

## 2016-01-13 VITALS — BP 152/75 | HR 70 | Temp 96.6°F | Resp 15 | Ht 66.0 in | Wt 130.0 lb

## 2016-01-13 DIAGNOSIS — M5136 Other intervertebral disc degeneration, lumbar region: Secondary | ICD-10-CM | POA: Diagnosis not present

## 2016-01-13 DIAGNOSIS — M546 Pain in thoracic spine: Secondary | ICD-10-CM | POA: Diagnosis present

## 2016-01-13 DIAGNOSIS — M47816 Spondylosis without myelopathy or radiculopathy, lumbar region: Secondary | ICD-10-CM

## 2016-01-13 DIAGNOSIS — M533 Sacrococcygeal disorders, not elsewhere classified: Secondary | ICD-10-CM | POA: Insufficient documentation

## 2016-01-13 DIAGNOSIS — M4806 Spinal stenosis, lumbar region: Secondary | ICD-10-CM | POA: Insufficient documentation

## 2016-01-13 DIAGNOSIS — Z9889 Other specified postprocedural states: Secondary | ICD-10-CM

## 2016-01-13 DIAGNOSIS — M51369 Other intervertebral disc degeneration, lumbar region without mention of lumbar back pain or lower extremity pain: Secondary | ICD-10-CM

## 2016-01-13 DIAGNOSIS — M19012 Primary osteoarthritis, left shoulder: Secondary | ICD-10-CM

## 2016-01-13 DIAGNOSIS — M5126 Other intervertebral disc displacement, lumbar region: Secondary | ICD-10-CM | POA: Insufficient documentation

## 2016-01-13 DIAGNOSIS — M542 Cervicalgia: Secondary | ICD-10-CM | POA: Diagnosis present

## 2016-01-13 DIAGNOSIS — R51 Headache: Secondary | ICD-10-CM | POA: Diagnosis not present

## 2016-01-13 DIAGNOSIS — M47812 Spondylosis without myelopathy or radiculopathy, cervical region: Secondary | ICD-10-CM

## 2016-01-13 DIAGNOSIS — M5030 Other cervical disc degeneration, unspecified cervical region: Secondary | ICD-10-CM

## 2016-01-13 DIAGNOSIS — Z046 Encounter for general psychiatric examination, requested by authority: Secondary | ICD-10-CM

## 2016-01-13 DIAGNOSIS — M19011 Primary osteoarthritis, right shoulder: Secondary | ICD-10-CM

## 2016-01-13 MED ORDER — OXYCODONE-ACETAMINOPHEN 10-325 MG PO TABS: ORAL_TABLET | ORAL | 0 refills | Status: DC

## 2016-01-13 NOTE — Patient Instructions (Addendum)
PLAN  Continue present medication oxycodone acetaminophen  Lumbar facet, medial branch nerve, blocks to be performed at time return appointment  F/U PCP [place] primary care for evaluation of  BP and general medical  condition  F/U surgical evaluation. May consider pending follow-up evaluations. Patient without plans for further surgical evaluation for surgical intervention at this time  F/U neurological evaluation. May consider PNCV/EMG studies and other studies pending follow-up evaluations  May consider radiofrequency rhizolysis or intraspinal procedures pending response to present treatment and F/U evaluation   Patient to call Pain Management Center should patient have concerns prior to scheduled return appointment. Facet Blocks Patient Information  Description: The facets are joints in the spine between the vertebrae.  Like any joints in the body, facets can become irritated and painful.  Arthritis can also effect the facets.  By injecting steroids and local anesthetic in and around these joints, we can temporarily block the nerve supply to them.  Steroids act directly on irritated nerves and tissues to reduce selling and inflammation which often leads to decreased pain.  Facet blocks may be done anywhere along the spine from the neck to the low back depending upon the location of your pain.   After numbing the skin with local anesthetic (like Novocaine), a small needle is passed onto the facet joints under x-ray guidance.  You may experience a sensation of pressure while this is being done.  The entire block usually lasts about 15-25 minutes.   Conditions which may be treated by facet blocks:   Low back/buttock pain  Neck/shoulder pain  Certain types of headaches  Preparation for the injection:  1. Do not eat any solid food or dairy products within 8 hours of your appointment. 2. You may drink clear liquid up to 3 hours before appointment.  Clear liquids include water, black  coffee, juice or soda.  No milk or cream please. 3. You may take your regular medication, including pain medications, with a sip of water before your appointment.  Diabetics should hold regular insulin (if taken separately) and take 1/2 normal NPH dose the morning of the procedure.  Carry some sugar containing items with you to your appointment. 4. A driver must accompany you and be prepared to drive you home after your procedure. 5. Bring all your current medications with you. 6. An IV may be inserted and sedation may be given at the discretion of the physician. 7. A blood pressure cuff, EKG and other monitors will often be applied during the procedure.  Some patients may need to have extra oxygen administered for a short period. 8. You will be asked to provide medical information, including your allergies and medications, prior to the procedure.  We must know immediately if you are taking blood thinners (like Coumadin/Warfarin) or if you are allergic to IV iodine contrast (dye).  We must know if you could possible be pregnant.  Possible side-effects:   Bleeding from needle site  Infection (rare, may require surgery)  Nerve injury (rare)  Numbness & tingling (temporary)  Difficulty urinating (rare, temporary)  Spinal headache (a headache worse with upright posture)  Light-headedness (temporary)  Pain at injection site (serveral days)  Decreased blood pressure (rare, temporary)  Weakness in arm/leg (temporary)  Pressure sensation in back/neck (temporary)   Call if you experience:   Fever/chills associated with headache or increased back/neck pain  Headache worsened by an upright position  New onset, weakness or numbness of an extremity below the injection site  Hives  or difficulty breathing (go to the emergency room)  Inflammation or drainage at the injection site(s)  Severe back/neck pain greater than usual  New symptoms which are concerning to you  Please  note:  Although the local anesthetic injected can often make your back or neck feel good for several hours after the injection, the pain will likely return. It takes 3-7 days for steroids to work.  You may not notice any pain relief for at least one week.  If effective, we will often do a series of 2-3 injections spaced 3-6 weeks apart to maximally decrease your pain.  After the initial series, you may be a candidate for a more permanent nerve block of the facets.  If you have any questions, please call #[phone] [facility]

## 2016-01-13 NOTE — Progress Notes (Signed)
The patient is a 60 year old female who returns to pain management for further evaluation and treatment of pain involving the neck entire back upper and lower extremity regions. The patient also has had headaches with pain radiating from the neck to the back of the head reciprocating headaches. At the present time patient states she has significant in severely incapacitating lower back lower extremity pain. The patient states that she has been engaged tends rather physical activity involving work around the new residence. The patient states that she has been involved in several projects at home involving rather strenuous work when trying to make modifications of the structures at the new residence. He caution patient regarding excessive physical activity and have advised patient to try to avoid such activity. The patient is with severely incapacitating pain involving activity daily living including ability to obtain restful sleep. We discussed patient's condition and we will proceed with lumbar facet, medial branch nerve, blocks at time return appointment in attempt to decrease severity of patient's symptoms, minimize progression of symptoms, and avoid the need for more involved treatment. All agreed to suggested treatment plan the patient tolerating medications oxycodone without any undesirable side effects.      Physical examination  There was tenderness of the splenius capitis and occipitalis region a moderate degree with moderate tenderness over the cervical facet region and cervical paraspinal musculature region. There was tenderness of the acromial clavicular and glenohumeral joint regions a moderate degree. The patient had moderate difficulty performing drop test. Patient appeared to be with slightly decreased grip strength with Tinel and Phalen's maneuver reproducing moderate discomfort. There was tenderness over the thoracic region without crepitus of the thoracic region noted. Palpation  over the lumbar region was with severely increased pain with lateral bending rotation extension and palpation of the lumbar facets reproducing severe pain. There was moderate to moderately severe tenderness of the PSIS and PII S region. Palpation of the greater trochanteric region iliotibial band region was with moderate discomfort. Straight leg raising was limited to 20 without definite increased pain with dorsiflexion noted. Patient had difficulty attending to stand on tiptoes and heels. There was tenderness over the region of the gluteal and piriformis musculature regions of moderate to moderately severe degree. There was moderate increase of pain with pressure applied to the ileum with patient in lateral decubitus position. There was negative clonus negative Homans. Abdomen nontender with no costovertebral tenderness noted     Assessment  Degenerative disc disease lumbar spine L3 -4 broad-based posterior disc bulge with thickening of the ligamentum flavum L4-5 posterior disc bulge facet arthropathy and thickening of the ligamentum flavum resulting in moderate canal stenosis and moderate neural foraminal narrowing. L5-S1 broad-based disc bulge posteriorly thickening of the ligamentum  flavum resulting in spinal stenosis and moderate bilateral neural foraminal stenosis  Lumbar stenosis with neurogenic claudication  Lumbar facet syndrome  Sacroiliac joint dysfunction     PLAN  Continue present medication oxycodone acetaminophen  Lumbar facet, medial branch nerve, blocks to be performed at time return appointment  F/U PCP [place] primary care for evaluation of  BP and general medical  condition  F/U surgical evaluation. May consider pending follow-up evaluations. Patient without plans for further surgical evaluation for surgical intervention at this time  F/U neurological evaluation. May consider PNCV/EMG studies and other studies pending follow-up evaluations  May consider  radiofrequency rhizolysis or intraspinal procedures pending response to present treatment and F/U evaluation   Patient to call Pain Management Center  should patient have concerns prior toscheduled return appointment

## 2016-01-19 ENCOUNTER — Ambulatory Visit: Payer: BLUE CROSS/BLUE SHIELD | Attending: Pain Medicine | Admitting: Pain Medicine

## 2016-01-19 ENCOUNTER — Encounter: Payer: Self-pay | Admitting: Pain Medicine

## 2016-01-19 VITALS — BP 131/79 | HR 74 | Temp 97.3°F | Resp 12 | Ht 66.0 in | Wt 130.0 lb

## 2016-01-19 DIAGNOSIS — M4806 Spinal stenosis, lumbar region: Secondary | ICD-10-CM | POA: Insufficient documentation

## 2016-01-19 DIAGNOSIS — M5136 Other intervertebral disc degeneration, lumbar region: Secondary | ICD-10-CM | POA: Diagnosis not present

## 2016-01-19 DIAGNOSIS — M47816 Spondylosis without myelopathy or radiculopathy, lumbar region: Secondary | ICD-10-CM

## 2016-01-19 DIAGNOSIS — M79606 Pain in leg, unspecified: Secondary | ICD-10-CM | POA: Diagnosis present

## 2016-01-19 DIAGNOSIS — M47812 Spondylosis without myelopathy or radiculopathy, cervical region: Secondary | ICD-10-CM

## 2016-01-19 DIAGNOSIS — M5126 Other intervertebral disc displacement, lumbar region: Secondary | ICD-10-CM | POA: Insufficient documentation

## 2016-01-19 DIAGNOSIS — M545 Low back pain: Secondary | ICD-10-CM | POA: Insufficient documentation

## 2016-01-19 DIAGNOSIS — M19011 Primary osteoarthritis, right shoulder: Secondary | ICD-10-CM

## 2016-01-19 DIAGNOSIS — M19012 Primary osteoarthritis, left shoulder: Secondary | ICD-10-CM

## 2016-01-19 DIAGNOSIS — M533 Sacrococcygeal disorders, not elsewhere classified: Secondary | ICD-10-CM

## 2016-01-19 DIAGNOSIS — Z9889 Other specified postprocedural states: Secondary | ICD-10-CM

## 2016-01-19 DIAGNOSIS — M5030 Other cervical disc degeneration, unspecified cervical region: Secondary | ICD-10-CM

## 2016-01-19 MED ORDER — FENTANYL CITRATE (PF) 100 MCG/2ML IJ SOLN
100.0000 ug | Freq: Once | INTRAMUSCULAR | Status: AC
  Administered 2016-01-19: 100 ug via INTRAVENOUS
  Filled 2016-01-19: qty 2

## 2016-01-19 MED ORDER — TRIAMCINOLONE ACETONIDE 40 MG/ML IJ SUSP
40.0000 mg | Freq: Once | INTRAMUSCULAR | Status: AC
  Administered 2016-01-19: 40 mg
  Filled 2016-01-19: qty 1

## 2016-01-19 MED ORDER — CIPROFLOXACIN HCL 250 MG PO TABS: 250.0000 mg | ORAL_TABLET | Freq: Two times a day (BID) | ORAL | 0 refills | Status: DC

## 2016-01-19 MED ORDER — ORPHENADRINE CITRATE 30 MG/ML IJ SOLN
60.0000 mg | Freq: Once | INTRAMUSCULAR | Status: DC
  Filled 2016-01-19: qty 2

## 2016-01-19 MED ORDER — MIDAZOLAM HCL 5 MG/5ML IJ SOLN
5.0000 mg | Freq: Once | INTRAMUSCULAR | Status: AC
  Administered 2016-01-19: 5 mg via INTRAVENOUS
  Filled 2016-01-19: qty 5

## 2016-01-19 MED ORDER — CIPROFLOXACIN IN D5W 400 MG/200ML IV SOLN
400.0000 mg | Freq: Once | INTRAVENOUS | Status: AC
  Administered 2016-01-19: 400 mg via INTRAVENOUS
  Filled 2016-01-19: qty 200

## 2016-01-19 MED ORDER — BUPIVACAINE HCL (PF) 0.25 % IJ SOLN
INTRAMUSCULAR | Status: AC
  Administered 2016-01-19: 30 mL
  Filled 2016-01-19: qty 30

## 2016-01-19 MED ORDER — LACTATED RINGERS IV SOLN: 1000.0000 mL | INTRAVENOUS | Status: DC

## 2016-01-19 MED ORDER — BUPIVACAINE HCL (PF) 0.25 % IJ SOLN
30.0000 mL | Freq: Once | INTRAMUSCULAR | Status: AC
  Administered 2016-01-19: 30 mL
  Filled 2016-01-19: qty 30

## 2016-01-19 NOTE — Progress Notes (Signed)
PROCEDURE PERFORMED: Lumbar facet (medial branch block)   NOTE: The patient is a 60 y.o. female who [facility] for further evaluation and treatment of pain involving the lumbar and lower extremity region. MRI  revealed the patient to be with evidence of degenerative disc disease lumbar spine with L3 -4 broad-based posterior disc bulge with thickening of the ligamentum flavum L4-5 posterior disc bulge facet arthropathy and thickening of the ligamentum flavum resulting in moderate canal stenosis and moderate neural foraminal narrowing. L5-S1 broad-based disc bulge posteriorly thickening of the ligamentum  flavum resulting in spinal stenosis and moderate bilateral neural foraminal stenosis.. There is concern regarding significant component of patient's pain being due to lumbar facet syndrome The risks, benefits, and expectations of the procedure have been discussed and explained to the patient who was understanding and in agreement with suggested treatment plan. We will proceed with interventional treatment as discussed and as explained to the patient who was understanding and wished to proceed with procedure as planned.   DESCRIPTION OF PROCEDURE: Lumbar facet (medial branch block) with IV Versed, IV fentanyl conscious sedation, EKG, blood pressure, pulse, capnography, and pulse oximetry monitoring. The procedure was performed with the patient in the prone position. Betadine prep of proposed entry site performed.   NEEDLE PLACEMENT AT: Left L 2 lumbar facet (medial branch block). Under fluoroscopic guidance with oblique orientation of 15 degrees, a 22-gauge needle was inserted at the L 2 vertebral body level with needle placed at the targeted area of Burton's Eye or Eye of the Scotty Dog with documentation of needle placement in the superior and lateral border of targeted area of Burton's Eye or Eye of the Scotty Dog with oblique orientation of 15 degrees. Following documentation of  needle placement at the L 2 vertebral body level, needle placement was then accomplished at the L 3 vertebral body level.   NEEDLE PLACEMENT AT L3, L4, and L5 VERTEBRAL BODY LEVELS ON THE LEFT SIDE The procedure was performed at the L3, L4, and L5 vertebral body levels exactly as was performed at the L 2 vertebral body level utilizing the same technique and under fluoroscopic guidance.  NEEDLE PLACEMENT AT THE SACRAL ALA with AP view of the lumbosacral spine. With the patient in the prone position, Betadine prep of proposed entry site accomplished, a 22 gauge needle was inserted in the region of the sacral ala (groove formed by the superior articulating process of S1 and the sacral wing). Following documentation of needle placement at the sacral ala,   Needle placement was then verified at all levels on lateral view. Following documentation of needle placement at all levels on lateral view and following negative aspiration for heme and CSF, each level was injected with 1 mL of 0.25% bupivacaine with Kenalog.     LUMBAR FACET, MEDIAL BRANCH NERVE, BLOCKS PERFORMED ON THE RIGHT SIDE   The procedure was performed on the right side exactly as was performed on the left side at the same levels and utilizing the same technique under fluoroscopic guidance.     The patient tolerated the procedure well. A total of 40 mg of Kenalog was utilized for the procedure.   PLAN:  1. Medications: The patient will continue presently prescribed medication oxycodone acetaminophen. 2. May consider modification of treatment regimen at time of return appointment pending response to treatment rendered on today's visit. 3. The patient is to follow-up with primary care physician at [facility] for further evaluation of blood  pressure and general medical condition status post steroid injection performed on today's visit. 4. Surgical follow-up evaluation. Has been addressed patient prefers to avoid further  surgical evaluation at this time 5. Neurological follow-up evaluation. May consider PNCV EMG studies and other studies 6. The patient may be candidate for radiofrequency procedures, implantation type procedures, and other treatment pending response to treatment and follow-up evaluation. 7. The patient has been advised to call the [facility] prior to scheduled return appointment should there be significant change in condition or should patient have other concerns regarding condition prior to scheduled return appointment.  The patient is understanding and in agreement with suggested treatment plan.

## 2016-01-19 NOTE — Patient Instructions (Addendum)
PLAN  Continue present medication oxycodone acetaminophen. Please obtain Cipro antibiotic and begin taking Cipro antibiotic today as prescribed  F/U PCP [place] primary care for evaluation of  BP and general medical  condition  F/U surgical evaluation. Surgical evaluation has been recommended to patient Patient without plans for further surgical evaluation for surgical intervention at this time  F/U neurological evaluation. May consider PNCV/EMG studies and other studies pending follow-up evaluations  May consider radiofrequency rhizolysis or intraspinal procedures pending response to present treatment and F/U evaluation   Patient to call [facility] should patient have concerns prior to scheduled return appointment.  Facet Blocks Patient Information  Description: The facets are joints in the spine between the vertebrae.  Like any joints in the body, facets can become irritated and painful.  Arthritis can also effect the facets.  By injecting steroids and local anesthetic in and around these joints, we can temporarily block the nerve supply to them.  Steroids act directly on irritated nerves and tissues to reduce selling and inflammation which often leads to decreased pain.  Facet blocks may be done anywhere along the spine from the neck to the low back depending upon the location of your pain.   After numbing the skin with local anesthetic (like Novocaine), a small needle is passed onto the facet joints under x-ray guidance.  You may experience a sensation of pressure while this is being done.  The entire block usually lasts about 15-25 minutes.   Conditions which may be treated by facet blocks:   Low back/buttock pain  Neck/shoulder pain  Certain types of headaches  Preparation for the injection:  1. Do not eat any solid food or dairy products within 8 hours of your appointment. 2. You may drink clear liquid up to 3 hours before appointment.  Clear liquids include water,  black coffee, juice or soda.  No milk or cream please. 3. You may take your regular medication, including pain medications, with a sip of water before your appointment.  Diabetics should hold regular insulin (if taken separately) and take 1/2 normal NPH dose the morning of the procedure.  Carry some sugar containing items with you to your appointment. 4. A driver must accompany you and be prepared to drive you home after your procedure. 5. Bring all your current medications with you. 6. An IV may be inserted and sedation may be given at the discretion of the physician. 7. A blood pressure cuff, EKG and other monitors will often be applied during the procedure.  Some patients may need to have extra oxygen administered for a short period. 8. You will be asked to provide medical information, including your allergies and medications, prior to the procedure.  We must know immediately if you are taking blood thinners (like Coumadin/Warfarin) or if you are allergic to IV iodine contrast (dye).  We must know if you could possible be pregnant.  Possible side-effects:   Bleeding from needle site  Infection (rare, may require surgery)  Nerve injury (rare)  Numbness & tingling (temporary)  Difficulty urinating (rare, temporary)  Spinal headache (a headache worse with upright posture)  Light-headedness (temporary)  Pain at injection site (serveral days)  Decreased blood pressure (rare, temporary)  Weakness in arm/leg (temporary)  Pressure sensation in back/neck (temporary)   Call if you experience:   Fever/chills associated with headache or increased back/neck pain  Headache worsened by an upright position  New onset, weakness or numbness of an extremity below the injection site  Hives or difficulty breathing (go to the emergency room)  Inflammation or drainage at the injection site(s)  Severe back/neck pain greater than usual  New symptoms which are concerning to you  Please  note:  Although the local anesthetic injected can often make your back or neck feel good for several hours after the injection, the pain will likely return. It takes 3-7 days for steroids to work.  You may not notice any pain relief for at least one week.   Pain Management Discharge Instructions  General Discharge Instructions :  If you need to reach your doctor call: Monday-Friday 8:00 am - 4:00 pm at [phone] or toll free [phone].  After clinic hours [phone] to have operator reach doctor.  Bring all of your medication bottles to all your appointments in the pain clinic.  To cancel or reschedule your appointment with Pain Management please remember to call 24 hours in advance to avoid a fee.  Refer to the educational materials which you have been given on: General Risks, I had my Procedure. Discharge Instructions, Post Sedation.  Post Procedure Instructions:  The drugs you were given will stay in your system until tomorrow, so for the next 24 hours you should not drive, make any legal decisions or drink any alcoholic beverages.  You may eat anything you prefer, but it is better to start with liquids then soups and crackers, and gradually work up to solid foods.  Please notify your doctor immediately if you have any unusual bleeding, trouble breathing or pain that is not related to your normal pain.  Depending on the type of procedure that was done, some parts of your body may feel week and/or numb.  This usually clears up by tonight or the next day.  Walk with the use of an assistive device or accompanied by an adult for the 24 hours.  You may use ice on the affected area for the first 24 hours.  Put ice in a Ziploc bag and cover with a towel and place against area 15 minutes on 15 minutes off.  You may switch to heat after 24 hours.  If effective, we will often do a series of 2-3 injections spaced 3-6 weeks apart to maximally decrease your pain.  After the initial  series, you may be a candidate for a more permanent nerve block of the facets.  If you have any questions, please call #[phone] [facility]

## 2016-01-20 ENCOUNTER — Telehealth: Payer: Self-pay

## 2016-01-20 NOTE — Telephone Encounter (Signed)
Patient states she is doing fine that she is a little sore, instructed to start with heat today and to call if needed.

## 2016-01-23 LAB — TOXASSURE SELECT 13 (MW), URINE: PDF: 0

## 2016-01-23 NOTE — Progress Notes (Signed)
Reviewed

## 2016-02-10 ENCOUNTER — Ambulatory Visit: Payer: BLUE CROSS/BLUE SHIELD | Admitting: Pain Medicine

## 2016-02-11 ENCOUNTER — Encounter: Payer: Self-pay | Admitting: Emergency Medicine

## 2016-02-11 ENCOUNTER — Emergency Department
Admission: EM | Admit: 2016-02-11 | Discharge: 2016-02-11 | Discharge status: Against Medical Advice | Payer: BLUE CROSS/BLUE SHIELD | Attending: Emergency Medicine | Admitting: Emergency Medicine

## 2016-02-11 DIAGNOSIS — I10 Essential (primary) hypertension: Secondary | ICD-10-CM | POA: Diagnosis not present

## 2016-02-11 DIAGNOSIS — F17210 Nicotine dependence, cigarettes, uncomplicated: Secondary | ICD-10-CM | POA: Diagnosis not present

## 2016-02-11 DIAGNOSIS — R109 Unspecified abdominal pain: Secondary | ICD-10-CM | POA: Diagnosis not present

## 2016-02-11 DIAGNOSIS — Z7982 Long term (current) use of aspirin: Secondary | ICD-10-CM | POA: Diagnosis not present

## 2016-02-11 DIAGNOSIS — Z79899 Other long term (current) drug therapy: Secondary | ICD-10-CM | POA: Diagnosis not present

## 2016-02-11 DIAGNOSIS — R110 Nausea: Secondary | ICD-10-CM | POA: Insufficient documentation

## 2016-02-11 LAB — URINALYSIS COMPLETE WITH MICROSCOPIC (ARMC ONLY)
Bacteria, UA: NONE SEEN
Bilirubin Urine: NEGATIVE
Glucose, UA: NEGATIVE mg/dL
Hgb urine dipstick: NEGATIVE
KETONES UR: NEGATIVE mg/dL
Leukocytes, UA: NEGATIVE
Nitrite: NEGATIVE
PROTEIN: NEGATIVE mg/dL
RBC / HPF: NONE SEEN RBC/hpf (ref 0–5)
SQUAMOUS EPITHELIAL / LPF: NONE SEEN
Specific Gravity, Urine: 1.003 — ABNORMAL LOW (ref 1.005–1.030)
WBC UA: NONE SEEN WBC/hpf (ref 0–5)
pH: 5 (ref 5.0–8.0)

## 2016-02-11 LAB — CBC
HCT: 38.6 % (ref 35.0–47.0)
Hemoglobin: 13.5 g/dL (ref 12.0–16.0)
MCH: 30.5 pg (ref 26.0–34.0)
MCHC: 35 g/dL (ref 32.0–36.0)
MCV: 87 fL (ref 80.0–100.0)
PLATELETS: 292 10*3/uL (ref 150–440)
RBC: 4.44 MIL/uL (ref 3.80–5.20)
RDW: 14.2 % (ref 11.5–14.5)
WBC: 8.2 10*3/uL (ref 3.6–11.0)

## 2016-02-11 LAB — COMPREHENSIVE METABOLIC PANEL
ALBUMIN: 4.2 g/dL (ref 3.5–5.0)
ALK PHOS: 65 U/L (ref 38–126)
ALT: 16 U/L (ref 14–54)
ANION GAP: 9 (ref 5–15)
AST: 19 U/L (ref 15–41)
BUN: 12 mg/dL (ref 6–20)
CHLORIDE: 99 mmol/L — AB (ref 101–111)
CO2: 25 mmol/L (ref 22–32)
CREATININE: 0.39 mg/dL — AB (ref 0.44–1.00)
Calcium: 9.4 mg/dL (ref 8.9–10.3)
GFR calc Af Amer: >60 mL/min (ref >60)
GFR calc non Af Amer: >60 mL/min (ref >60)
GLUCOSE: 100 mg/dL — AB (ref 65–99)
Potassium: 3.9 mmol/L (ref 3.5–5.1)
SODIUM: 133 mmol/L — AB (ref 135–145)
Total Bilirubin: 0.8 mg/dL (ref 0.3–1.2)
Total Protein: 8.2 g/dL — ABNORMAL HIGH (ref 6.5–8.1)

## 2016-02-11 LAB — LIPASE, BLOOD: LIPASE: 24 U/L (ref 11–51)

## 2016-02-11 MED ORDER — ONDANSETRON 4 MG PO TBDP
ORAL_TABLET | ORAL | Status: AC
  Filled 2016-02-11: qty 1

## 2016-02-11 MED ORDER — ONDANSETRON 4 MG PO TBDP
4.0000 mg | ORAL_TABLET | Freq: Once | ORAL | Status: AC | PRN
  Administered 2016-02-11: 4 mg via ORAL

## 2016-02-11 NOTE — ED Triage Notes (Signed)
Pt presents to ED via POV with family member. Pt c/o abdominal cramping and nausea that has been intermittent for the past 4 months. Pt states she moved into a new house and found out later the well system did not have a filter on it. Pt has since moved out.  Pt c/o diarrhea x3 today. Pt states abdominal pain has worsened over the past week.  Family member also states he hasn't felt good but is not as bad.  Pt has not been vomiting but c/o nausea.

## 2016-02-11 NOTE — ED Provider Notes (Signed)
Patient eloped prior to me being able to do a history and physical exam. I was able to reach her by telephone and she says her symptoms seem to be improving. She was concerned about her irritable bowel syndrome as well as her kidney and liver function tests. She was simply concerned about the kidney and liver function tests because she thought that she may have ingested contaminated water.    I urged her to return to the emergency department at her earliest convenience and that we may complete a complete history and physical exam. She says that she would and that that she also has follow-up with her primary care doctor this Monday.     Myrna Blazeravid Matthew Algie Cales, MD 02/11/16 [phone]

## 2016-02-12 ENCOUNTER — Encounter: Payer: Self-pay | Admitting: Emergency Medicine

## 2016-02-12 ENCOUNTER — Emergency Department
Admission: EM | Admit: 2016-02-12 | Discharge: 2016-02-12 | Disposition: A | Discharge status: Home / Self Care | Payer: BLUE CROSS/BLUE SHIELD | Attending: Emergency Medicine | Admitting: Emergency Medicine

## 2016-02-12 DIAGNOSIS — R197 Diarrhea, unspecified: Secondary | ICD-10-CM | POA: Diagnosis not present

## 2016-02-12 DIAGNOSIS — R110 Nausea: Secondary | ICD-10-CM | POA: Insufficient documentation

## 2016-02-12 DIAGNOSIS — I10 Essential (primary) hypertension: Secondary | ICD-10-CM | POA: Insufficient documentation

## 2016-02-12 DIAGNOSIS — Z79899 Other long term (current) drug therapy: Secondary | ICD-10-CM | POA: Diagnosis not present

## 2016-02-12 DIAGNOSIS — R109 Unspecified abdominal pain: Secondary | ICD-10-CM | POA: Diagnosis not present

## 2016-02-12 DIAGNOSIS — F17210 Nicotine dependence, cigarettes, uncomplicated: Secondary | ICD-10-CM | POA: Diagnosis not present

## 2016-02-12 DIAGNOSIS — R1084 Generalized abdominal pain: Secondary | ICD-10-CM | POA: Diagnosis present

## 2016-02-12 DIAGNOSIS — Z7982 Long term (current) use of aspirin: Secondary | ICD-10-CM | POA: Diagnosis not present

## 2016-02-12 LAB — URINALYSIS COMPLETE WITH MICROSCOPIC (ARMC ONLY)
BILIRUBIN URINE: NEGATIVE
Glucose, UA: NEGATIVE mg/dL
Hgb urine dipstick: NEGATIVE
KETONES UR: NEGATIVE mg/dL
LEUKOCYTES UA: NEGATIVE
Nitrite: NEGATIVE
PH: 7 (ref 5.0–8.0)
Protein, ur: NEGATIVE mg/dL
RBC / HPF: NONE SEEN RBC/hpf (ref 0–5)
SQUAMOUS EPITHELIAL / LPF: NONE SEEN
Specific Gravity, Urine: 1.003 — ABNORMAL LOW (ref 1.005–1.030)
WBC, UA: NONE SEEN WBC/hpf (ref 0–5)

## 2016-02-12 LAB — COMPREHENSIVE METABOLIC PANEL
ALK PHOS: 64 U/L (ref 38–126)
ALT: 15 U/L (ref 14–54)
AST: 19 U/L (ref 15–41)
Albumin: 4.3 g/dL (ref 3.5–5.0)
Anion gap: 8 (ref 5–15)
BILIRUBIN TOTAL: 0.6 mg/dL (ref 0.3–1.2)
BUN: 10 mg/dL (ref 6–20)
CALCIUM: 9.3 mg/dL (ref 8.9–10.3)
CHLORIDE: 100 mmol/L — AB (ref 101–111)
CO2: 25 mmol/L (ref 22–32)
CREATININE: 0.52 mg/dL (ref 0.44–1.00)
GFR calc Af Amer: >60 mL/min (ref >60)
Glucose, Bld: 96 mg/dL (ref 65–99)
Potassium: 3.8 mmol/L (ref 3.5–5.1)
Sodium: 133 mmol/L — ABNORMAL LOW (ref 135–145)
TOTAL PROTEIN: 7.9 g/dL (ref 6.5–8.1)

## 2016-02-12 LAB — LIPASE, BLOOD: Lipase: 24 U/L (ref 11–51)

## 2016-02-12 LAB — CBC
HCT: 39.4 % (ref 35.0–47.0)
Hemoglobin: 13.8 g/dL (ref 12.0–16.0)
MCH: 30.5 pg (ref 26.0–34.0)
MCHC: 35 g/dL (ref 32.0–36.0)
MCV: 87.2 fL (ref 80.0–100.0)
PLATELETS: 268 10*3/uL (ref 150–440)
RBC: 4.51 MIL/uL (ref 3.80–5.20)
RDW: 14.7 % — AB (ref 11.5–14.5)
WBC: 10 10*3/uL (ref 3.6–11.0)

## 2016-02-12 MED ORDER — ONDANSETRON HCL 4 MG/2ML IJ SOLN
4.0000 mg | Freq: Once | INTRAMUSCULAR | Status: AC | PRN
  Administered 2016-02-12: 4 mg via INTRAVENOUS
  Filled 2016-02-12: qty 2

## 2016-02-12 MED ORDER — LOPERAMIDE HCL 2 MG PO TABS: 2.0000 mg | ORAL_TABLET | Freq: Four times a day (QID) | ORAL | 0 refills | Status: DC | PRN

## 2016-02-12 MED ORDER — ONDANSETRON HCL 4 MG PO TABS: 4.0000 mg | ORAL_TABLET | Freq: Three times a day (TID) | ORAL | 0 refills | Status: DC | PRN

## 2016-02-12 NOTE — ED Provider Notes (Addendum)
[facility] Emergency Department Provider Note  ____________________________________________  Time seen: Approximately 9:06 PM  I have reviewed the triage vital signs and the nursing notes.   HISTORY  Chief Complaint Abdominal Pain and Nausea    HPI Katrina Holmes is a 60 y.o. female w/ a remote hx of IBS but not generally symptomatic presenting w/ 24mo of daily diarrhea, abdominal cramping, nausea without vomiting. The patient reports that she has approximate 3 episodes of loose nonbloody stools associated with abdominal cramping daily. She does not note any association to specific foods. She also has diffuse abdominal cramping without focality, as well as nausea without vomiting. She has not had any fevers, she is not been camping, has not had travel outside the [place]. No recent abx.  She has an appointment on Monday to see her primary care physician. She and her husband were concerned that the drinking contaminated well water in a new home they moved to, but they have moved back to the original home and her symptoms are persistent. The pt has not tried any medications for her symptoms.   Past Medical History:  Diagnosis Date  . Allergy   . Anxiety   . Depression   . Hypertension   . Suicide attempt [facility])     Patient Active Problem List   Diagnosis Date Noted  . Severe recurrent major depression without psychotic features (HCC) 12/29/2015  . Chronic pain 12/29/2015  . Involuntary commitment 12/29/2015  . DDD (degenerative disc disease), cervical 05/25/2015  . H/O cervical spine surgery 05/25/2015  . Cervical facet syndrome 05/25/2015  . 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 DDD (degenerative disc disease), lumbar 05/25/2015  . Facet syndrome, lumbar 05/25/2015    Past Surgical History:  Procedure Laterality Date  . ABDOMINAL HYSTERECTOMY    . NECK SURGERY  2006  . SHOULDER ARTHROSCOPY WITH ROTATOR CUFF REPAIR Left    3 surgeries (2006-2007-2008)     Current Outpatient Rx  . Order #: [id number] Class: Historical Med  . Order #: [id number] Class: Historical Med  . Order #: [id number] Class: Normal  . Order #: [id number] Class: Historical Med  . Order #: [id number] Class: Historical Med  . Order #: [id number] Class: Historical Med  . Order #: [id number] Class: Print  . Order #: [id number] Class: Print  . Order #: [id number] Class: Historical Med  . Order #: [id number] Class: Historical Med  . Order #: [id number] Class: Historical Med  . Order #: [id number] Class: Historical Med  . Order #: [id number] Class: Print  . Order #: [id number] Class: Print    Allergies Nsaids and Penicillins  Family History  Problem Relation Age of Onset  . Heart disease Mother   . Stroke Father     Social History Social History  Substance Use Topics  . Smoking status: Current Every Day Smoker    Packs/day: 0.50    Types: Cigarettes  . Smokeless tobacco: Never Used  . Alcohol use No    Review of Systems Constitutional: No fever/chills.No lightheadedness or syncope. Eyes: No visual changes. ENT: No sore throat. No congestion or rhinorrhea. Cardiovascular: Denies chest pain. Denies palpitations. Respiratory: Denies shortness of breath.  No cough. Gastrointestinal: Positive abdominal pain.  Positive nausea, no vomiting.  Positive diarrhea.  No constipation. Genitourinary: Negative for dysuria. Musculoskeletal: Negative for back pain. Skin: Negative for rash. Neurological: Negative for headaches. No focal numbness, tingling or weakness.   10-point ROS otherwise negative.  ____________________________________________   PHYSICAL EXAM:  VITAL SIGNS: ED Triage Vitals  Enc Vitals Group  BP 02/12/16 1623 (!) 142/86     Pulse Rate 02/12/16 1623 74     Resp 02/12/16 1623 16     Temp 02/12/16 1623 98.2 F (36.8 C)     Temp Source 02/12/16 1623 Oral     SpO2 02/12/16 1623 97 %     Weight 02/12/16 1624 125 lb (56.7 kg)     Height 02/12/16 1624 5\' 6"  (1.676  m)     Head Circumference --      Peak Flow --      Pain Score 02/12/16 1624 4     Pain Loc --      Pain Edu? --      Excl. in GC? --     Constitutional: Alert and oriented. Well appearing and in no acute distress. Answers questions appropriately. Eyes: Conjunctivae are normal.  EOMI. No scleral icterus. Head: Atraumatic. Nose: No congestion/rhinnorhea. Mouth/Throat: Mucous membranes are moist.  Neck: No stridor.  Supple.   Cardiovascular: Normal rate, regular rhythm. No murmurs, rubs or gallops.  Respiratory: Normal respiratory effort.  No accessory muscle use or retractions. Lungs CTAB.  No wheezes, rales or ronchi. Gastrointestinal: Soft, nontender and nondistended.  No guarding or rebound.  No peritoneal signs. Musculoskeletal: Moves all extremity's without pain Neurologic:  A&Ox3.  Speech is clear.  Face and smile are symmetric.  EOMI.  Moves all extremities well. Skin:  Skin is warm, dry and intact. No rash noted. Psychiatric: Mood and affect are normal. Speech and behavior are normal.  Normal judgement.  ____________________________________________   LABS (all labs ordered are listed, but only abnormal results are displayed)  Labs Reviewed  COMPREHENSIVE METABOLIC PANEL - Abnormal; Notable for the following:       Result Value   Sodium 133 (*)    Chloride 100 (*)    All other components within normal limits  CBC - Abnormal; Notable for the following:    RDW 14.7 (*)    All other components within normal limits  URINALYSIS COMPLETEWITH MICROSCOPIC ([facility] ONLY) - Abnormal; Notable for the following:    Color, Urine STRAW (*)    APPearance CLEAR (*)    Specific Gravity, Urine 1.003 (*)    Bacteria, UA RARE (*)    All other components within normal limits  LIPASE, BLOOD   ____________________________________________  EKG  Not indicated ____________________________________________  RADIOLOGY  No results  found.  ____________________________________________   PROCEDURES  Procedure(s) performed: None  Procedures  Critical Care performed: No ____________________________________________   INITIAL IMPRESSION / ASSESSMENT AND PLAN / ED COURSE  Pertinent labs & imaging results that were available during my care of the patient were reviewed by me and considered in my medical decision making (see chart for details).  60 y.o. female with a one-month history of 3 episodes daily of diarrhea, abdominal cramping and nausea without vomiting. The patient has some mild hypertension but otherwise reassuring vital signs. She is afebrile. Abdominal examination is reassuring and she does not have any evidence of acute surgical or infectious pathology. Her laboratory studies are also reassuring and her potassium is normal today. She does not have urinary tract infection in her white blood cell count is normal. At this time, the patient is safe for discharge. I will discharge her with medications for symptom at a treatment and have her follow-up with her primary care physician, with whom she has no appointment in 2 days. Return precautions as well as follow-up instructions were discussed.  ____________________________________________  FINAL CLINICAL IMPRESSION(S) /  ED DIAGNOSES  Final diagnoses:  Abdominal cramping  Nausea without vomiting  Diarrhea, unspecified type    Clinical Course      NEW MEDICATIONS STARTED DURING THIS VISIT:  New Prescriptions   LOPERAMIDE (IMODIUM A-D) 2 MG TABLET    Take 1 tablet (2 mg total) by mouth 4 (four) times daily as needed for diarrhea or loose stools.   ONDANSETRON (ZOFRAN) 4 MG TABLET    Take 1 tablet (4 mg total) by mouth every 8 (eight) hours as needed for nausea or vomiting.      Rockne MenghiniAnne-Caroline Weldon Nouri, MD 02/12/16 2110    Rockne MenghiniAnne-Caroline Shigeko Manard, MD 02/12/16 2110

## 2016-02-12 NOTE — ED Notes (Signed)
Pt resting quietly, watching tv, waiting patiently for MD assessment; pt says the low midline pain she's been having for a month  Is a cramping pain; urinary frequency; no dysuria; pt says she has an appt with her provider Monday but couldn't wait any longer to be seen; was here last night but left before seeing a provider because "there were more people that needed to be seen more than I did"

## 2016-02-12 NOTE — ED Notes (Signed)
Dr Sharma CovertNorman in to followup

## 2016-02-12 NOTE — ED Notes (Signed)
Pt given sterile urine collection cup and wipes for ordered UA; verbalized understanding of proper collection procedure;

## 2016-02-12 NOTE — Discharge Instructions (Signed)
Please eat a bland diet such as the one suggested in the paperwork, called the BRAT diet. Please drink plenty of fluid to stay well-hydrated. You may take loperamide for diarrhea and Zofran for nausea and vomiting.  Keep your appointment with your primary care physician on Monday.  Return to the emergency department if he develops severe pain, fever, inability to keep down fluids, lightheadedness or fainting, or any other symptoms concerning to you.

## 2016-02-12 NOTE — ED Notes (Addendum)
Pt states took 1g tylenol today around 1pm, also reports taking bentyl, dramamine, omeprazole today.  Pt states unable to eat, drink, or sleep recently.

## 2016-02-12 NOTE — ED Notes (Signed)
Pt states they found out their landlord was not upkeeping the well and that they were drinking unfiltered water that had chlorine tablets in it. They moved a week ago with nausea and abd pain x 1 month. Labs drawn yesterday here in the ed.

## 2016-02-12 NOTE — ED Triage Notes (Signed)
abd pain and nausea x 1 month. Was here yesterday and labs and xrs done but pt left without dx.

## 2016-02-15 ENCOUNTER — Encounter: Payer: Self-pay | Admitting: Pain Medicine

## 2016-02-15 ENCOUNTER — Ambulatory Visit: Payer: BLUE CROSS/BLUE SHIELD | Attending: Pain Medicine | Admitting: Pain Medicine

## 2016-02-15 VITALS — BP 126/77 | HR 67 | Temp 97.8°F | Resp 15 | Ht 66.0 in | Wt 125.0 lb

## 2016-02-15 DIAGNOSIS — M542 Cervicalgia: Secondary | ICD-10-CM | POA: Diagnosis present

## 2016-02-15 DIAGNOSIS — M533 Sacrococcygeal disorders, not elsewhere classified: Secondary | ICD-10-CM | POA: Diagnosis not present

## 2016-02-15 DIAGNOSIS — M62830 Muscle spasm of back: Secondary | ICD-10-CM | POA: Insufficient documentation

## 2016-02-15 DIAGNOSIS — M19011 Primary osteoarthritis, right shoulder: Secondary | ICD-10-CM

## 2016-02-15 DIAGNOSIS — M47816 Spondylosis without myelopathy or radiculopathy, lumbar region: Secondary | ICD-10-CM

## 2016-02-15 DIAGNOSIS — M19012 Primary osteoarthritis, left shoulder: Secondary | ICD-10-CM

## 2016-02-15 DIAGNOSIS — M4806 Spinal stenosis, lumbar region: Secondary | ICD-10-CM | POA: Insufficient documentation

## 2016-02-15 DIAGNOSIS — M5136 Other intervertebral disc degeneration, lumbar region: Secondary | ICD-10-CM | POA: Diagnosis not present

## 2016-02-15 DIAGNOSIS — M5030 Other cervical disc degeneration, unspecified cervical region: Secondary | ICD-10-CM

## 2016-02-15 DIAGNOSIS — M51369 Other intervertebral disc degeneration, lumbar region without mention of lumbar back pain or lower extremity pain: Secondary | ICD-10-CM

## 2016-02-15 DIAGNOSIS — M47812 Spondylosis without myelopathy or radiculopathy, cervical region: Secondary | ICD-10-CM

## 2016-02-15 DIAGNOSIS — M545 Low back pain: Secondary | ICD-10-CM | POA: Diagnosis present

## 2016-02-15 DIAGNOSIS — G8929 Other chronic pain: Secondary | ICD-10-CM

## 2016-02-15 DIAGNOSIS — Z9889 Other specified postprocedural states: Secondary | ICD-10-CM

## 2016-02-15 MED ORDER — OXYCODONE-ACETAMINOPHEN 10-325 MG PO TABS: ORAL_TABLET | ORAL | 0 refills | Status: DC

## 2016-02-15 NOTE — Progress Notes (Signed)
Safety precautions to be maintained throughout the outpatient stay will include: orient to surroundings, keep bed in low position, maintain call bell within reach at all times, provide assistance with transfer out of bed and ambulation. Pt went to er for ibs and thinks she and her husband had chlorine poisioning . Pt to see pcp tomorrow for checkup. Had blood work everything ok per pt,

## 2016-02-15 NOTE — Progress Notes (Signed)
The patient is a 60 year old female who returns to pain management for further evaluation and treatment of pain involving the neck upper extremity region lower back lower extremity region and mid back pain. The patient states that the present time the pain is fairly well controlled. The patient is status post lumbar facet, medial branch nerve, blocks. The patient states that she has just recently returned to her previous dwelling which is all within the residence where she was living. The patient admits to this physical activity needing to be performed to care for the present residence. The patient states that there have been concern regarding the water at her larger residence. The water appeared to have excessive coloring. The patient will continue to undergo evaluations in this regard and will follow-up with primary care physician for further assessment of patient's general medical condition following suspected exposure to excessive coloring. The patient denied any trauma change in events of daily living the call significant change in symptomatology. We will continue presently prescribed medication oxycodone acetaminophen at this time and the patient is to call pain management prior to scheduled return appointment should there be significant change in condition. All agreed to suggested treatment plan     Physical examination   There was tenderness to palpation of the paraspinal musculature region cervical region cervical facet region palpation which be produced pain of moderate degree. There was moderate tenderness of the splenius capitis and occipitalis region. Palpation of the acromioclavicular and glenohumeral joint regions reproduce moderate discomfort. The patient appeared to be with unremarkable Spurling's maneuver and was with moderate difficulty performing drop test. Palpation over the region of the thoracic region was attends to palpation and evidence of moderate muscle spasm with no  crepitus of the thoracic region noted. There was tenderness over the lumbar paraspinal musculatures and lumbar facet region with lateral bending rotation extension and palpation of the lumbar facets reproducing moderate discomfort. There was moderate tenderness over the PSIS and PII S region. Straight leg raise was tolerates approximately 30 without increased pain with dorsiflexion noted. DTRs appeared to be trace at the knees. There was negative clonus negative Homans. No definite sensory deficit or dermatomal distribution detected. Palpation of the greater trochanteric region iliotibial band region with pain of moderate degree. There was moderate increase of pain with pressure applied to the ileum with patient in lateral decubitus position. No sensory deficit of dermatomal distribution was detected. There was negative clonus negative Homans. There was moderate muscle spasm of the thoracic and lumbar paraspinal musculature regions. Abdomen was nontender with no costovertebral tenderness noted     Assessment   Degenerative disc disease lumbar spine L3 -4 broad-based posterior disc bulge with thickening of the ligamentum flavum L4-5 posterior disc bulge facet arthropathy and thickening of the ligamentum flavum resulting in moderate canal stenosis and moderate neural foraminal narrowing. L5-S1 broad-based disc bulge posteriorly thickening of the ligamentum  flavum resulting in spinal stenosis and moderate bilateral neural foraminal stenosis  Lumbar stenosis with neurogenic claudication  Lumbar facet syndrome  Sacroiliac joint dysfunction        PLAN  Continue present medication oxycodone acetaminophen  F/U PCP [facility] for evaluation of  BP and general medical  condition  F/U surgical evaluation. May consider pending follow-up evaluations. Patient without plans for further surgical evaluation for surgical intervention at this time  F/U neurological evaluation. May consider  PNCV/EMG studies and other studies pending follow-up evaluations  May consider radiofrequency rhizolysis or intraspinal procedures pending response  to present treatment and F/U evaluation   Patient to call [facility] Management Center should patient have concerns prior to scheduled return appointment.

## 2016-02-15 NOTE — Patient Instructions (Signed)
PLAN  Continue present medication oxycodone acetaminophen  F/U PCP [facility] for evaluation of  BP and general medical  condition  F/U surgical evaluation. May consider pending follow-up evaluations. Patient without plans for further surgical evaluation for surgical intervention at this time  F/U neurological evaluation. May consider PNCV/EMG studies and other studies pending follow-up evaluations  May consider radiofrequency rhizolysis or intraspinal procedures pending response to present treatment and F/U evaluation   Patient to call Pain Management Center should patient have concerns prior to scheduled return appointment.

## 2016-02-25 ENCOUNTER — Telehealth: Payer: Self-pay | Admitting: *Deleted

## 2016-03-01 ENCOUNTER — Emergency Department: Payer: BLUE CROSS/BLUE SHIELD

## 2016-03-01 ENCOUNTER — Emergency Department
Admission: EM | Admit: 2016-03-01 | Discharge: 2016-03-01 | Disposition: A | Discharge status: Home / Self Care | Payer: BLUE CROSS/BLUE SHIELD | Attending: Emergency Medicine | Admitting: Emergency Medicine

## 2016-03-01 DIAGNOSIS — Y929 Unspecified place or not applicable: Secondary | ICD-10-CM | POA: Diagnosis not present

## 2016-03-01 DIAGNOSIS — F17210 Nicotine dependence, cigarettes, uncomplicated: Secondary | ICD-10-CM | POA: Diagnosis not present

## 2016-03-01 DIAGNOSIS — M545 Low back pain: Secondary | ICD-10-CM | POA: Diagnosis present

## 2016-03-01 DIAGNOSIS — Z79899 Other long term (current) drug therapy: Secondary | ICD-10-CM | POA: Diagnosis not present

## 2016-03-01 DIAGNOSIS — I10 Essential (primary) hypertension: Secondary | ICD-10-CM | POA: Insufficient documentation

## 2016-03-01 DIAGNOSIS — Z7982 Long term (current) use of aspirin: Secondary | ICD-10-CM | POA: Insufficient documentation

## 2016-03-01 DIAGNOSIS — Y999 Unspecified external cause status: Secondary | ICD-10-CM | POA: Diagnosis not present

## 2016-03-01 DIAGNOSIS — M549 Dorsalgia, unspecified: Secondary | ICD-10-CM

## 2016-03-01 DIAGNOSIS — W19XXXA Unspecified fall, initial encounter: Secondary | ICD-10-CM | POA: Diagnosis not present

## 2016-03-01 DIAGNOSIS — Y939 Activity, unspecified: Secondary | ICD-10-CM | POA: Diagnosis not present

## 2016-03-01 DIAGNOSIS — G8929 Other chronic pain: Secondary | ICD-10-CM | POA: Insufficient documentation

## 2016-03-01 DIAGNOSIS — M5416 Radiculopathy, lumbar region: Secondary | ICD-10-CM | POA: Diagnosis not present

## 2016-03-01 MED ORDER — TRAMADOL HCL 50 MG PO TABS: 50.0000 mg | ORAL_TABLET | Freq: Four times a day (QID) | ORAL | 0 refills | Status: DC | PRN

## 2016-03-01 NOTE — ED Provider Notes (Signed)
[facility]-EMERGENCY DEPARTMENT Provider Note   CSN: [id number] Arrival date & time: 03/01/16  1535     History   Chief Complaint Chief Complaint  Patient presents with  . Back Pain  . Leg Pain    HPI Katrina Holmes is a 60 y.o. female presents to emergency department for evaluation of a fall 1 week ago. Patient states she has chronic back pain, sees Dr. Tawana Scale lower back injections and pain medications from pain management. Patient states she fell from a standing position, injured her left lower back. She points to the left buttocks. She has pain going down the posterior thigh that she describes a sharp shooting pain. Mild numbness in her left foot. She's been taking Tylenol with no improvement. She denies any loss of bowel or bladder symptoms. No other injury to her body. No head trauma nausea or vomiting. She is ambulatory with assistive devices.patient had a recent lower lumbar injection, responded well up until a fall one week ago.  HPI  Past Medical History:  Diagnosis Date  . Allergy   . Anxiety   . Depression   . Hypertension   . IBS (irritable bowel syndrome) 15 years  . Suicide attempt [facility])     Patient Active Problem List   Diagnosis Date Noted  . Severe recurrent major depression without psychotic features (HCC) 12/29/2015  . Chronic pain 12/29/2015  . Involuntary commitment 12/29/2015  . DDD (degenerative disc disease), cervical 05/25/2015  . H/O cervical spine surgery 05/25/2015  . Cervical facet syndrome 05/25/2015  . 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 DDD (degenerative disc disease), lumbar 05/25/2015  . Facet syndrome, lumbar 05/25/2015    Past Surgical History:  Procedure Laterality Date  . ABDOMINAL HYSTERECTOMY    . NECK SURGERY  2006  . SHOULDER ARTHROSCOPY WITH ROTATOR CUFF REPAIR Left    3 surgeries (2006-2007-2008)    OB History    Gravida Para Term Preterm AB Living   3         4   SAB TAB Ectopic Multiple Live Births                    Home Medications    Prior to Admission medications   Medication Sig Start Date End Date Taking? Authorizing Provider  amitriptyline (ELAVIL) 10 MG tablet Take 10 mg by mouth at bedtime. Reported on 12/14/2015    Historical Provider, MD  aspirin EC 81 MG tablet Take 81 mg by mouth daily.    Historical Provider, MD  ciprofloxacin (CIPRO) 250 MG tablet Take 1 tablet (250 mg total) by mouth 2 (two) times daily. Patient not taking: Reported on 02/15/2016 01/19/16   Ewing Schlein, MD  citalopram (CELEXA) 10 MG tablet Take 10 mg by mouth daily. Reported on 12/28/2015    Historical Provider, MD  diazepam (VALIUM) 5 MG tablet Take 5 mg by mouth every 8 (eight) hours as needed for anxiety. Reported on 12/28/2015    Historical Provider, MD  dicyclomine (BENTYL) 20 MG tablet Take 20 mg by mouth every 6 (six) hours as needed for spasms.    Historical Provider, MD  FLUoxetine (PROZAC) 20 MG capsule Take 1 capsule (20 mg total) by mouth daily. Patient not taking: Reported on 02/15/2016 12/29/15   Audery Amel, MD  loperamide (IMODIUM A-D) 2 MG tablet Take 1 tablet (2 mg total) by mouth 4 (four) times daily as needed for diarrhea or loose stools. 02/12/16   Rockne Menghini, MD  loratadine (CLARITIN) 10 MG tablet Take 10 mg by mouth daily.    Historical Provider, MD  metoprolol succinate (TOPROL XL) 50 MG 24 hr tablet Take 50 mg by mouth daily. Take with or immediately following a meal.    Historical Provider, MD  Multiple Vitamin (MULTI-VITAMINS) TABS Take 1 tablet by mouth daily.    Historical Provider, MD  omeprazole (PRILOSEC) 20 MG capsule Take 20 mg by mouth 2 (two) times daily as needed (for acid reflux).    Historical Provider, MD  ondansetron (ZOFRAN) 4 MG tablet Take 1 tablet (4 mg total) by mouth every 8 (eight) hours as needed for nausea or vomiting. 02/12/16 02/11/17  Rockne MenghiniAnne-Caroline Norman, MD  oxyCODONE-acetaminophen (PERCOCET) 10-325 MG tablet Limit 1 tablet by mouth 2-4 times per day if  tolerated 02/15/16   Ewing SchleinGregory Crisp, MD  traMADol (ULTRAM) 50 MG tablet Take 1 tablet (50 mg total) by mouth every 6 (six) hours as needed. 03/01/16   Evon Slackhomas C Gaines, PA-C    Family History Family History  Problem Relation Age of Onset  . Heart disease Mother   . Stroke Father     Social History Social History  Substance Use Topics  . Smoking status: Current Every Day Smoker    Packs/day: 0.50    Types: Cigarettes  . Smokeless tobacco: Never Used  . Alcohol use No     Allergies   Nsaids and Penicillins   Review of Systems Review of Systems  Constitutional: Negative for activity change, chills, fatigue and fever.  HENT: Negative for congestion, sinus pressure and sore throat.   Eyes: Negative for visual disturbance.  Respiratory: Negative for cough, chest tightness and shortness of breath.   Cardiovascular: Negative for chest pain and leg swelling.  Gastrointestinal: Negative for abdominal pain, diarrhea, nausea and vomiting.  Genitourinary: Negative for dysuria.  Musculoskeletal: Positive for back pain. Negative for arthralgias and gait problem.  Skin: Negative for rash.  Neurological: Negative for weakness, numbness and headaches.  Hematological: Negative for adenopathy.  Psychiatric/Behavioral: Negative for agitation, behavioral problems and confusion.     Physical Exam Updated Vital Signs BP (!) 142/76 (BP Location: Left Arm)   Pulse 95   Temp 97.5 F (36.4 C) (Oral)   Resp 17   Ht 5\' 6"  (1.676 m)   Wt 56.7 kg   SpO2 100%   BMI 20.18 kg/m   Physical Exam  Constitutional: She is oriented to person, place, and time. She appears well-developed and well-nourished. No distress.  HENT:  Head: Normocephalic and atraumatic.  Mouth/Throat: Oropharynx is clear and moist.  Eyes: EOM are normal. Pupils are equal, round, and reactive to light. Right eye exhibits no discharge. Left eye exhibits no discharge.  Neck: Normal range of motion. Neck supple.   Cardiovascular: Normal rate, regular rhythm and intact distal pulses.   Pulmonary/Chest: Effort normal and breath sounds normal. No respiratory distress. She exhibits no tenderness.  Abdominal: Soft. She exhibits no distension. There is no tenderness.  Musculoskeletal:  Lumbar Spine: Examination of the lumbar spine reveals no bony abnormality, no edema, and no ecchymosis.  There is no step off.  The patient has decreased range of motion of the lumbar spine with flexion and extension.  The patient has decreased lateral bend and rotation.  The patient has pain with lumbar flexion and extension..  Patient is tender along the lumbosacral junction along the spinous process at L5-S1.  The patient is non tender along the paravertebral muscles, with no muscle spasms.  The patient is non tender along the iliac crest.  The patient is non tender in the sciatic notch.  The patient is non tender along the Sacroiliac joint.  There is no Coccyx joint tenderness.    Bilateral Lower Extremities: Examination of the lower extremities reveals no bony abnormality, no edema, and no ecchymosis.  The patient has full active and passive range of motion of the hips, knees, and ankles.  There is no discomfort with range of motion exercises.  The patient is non tender along the greater trochanter region.  The patient has a negative Denna Haggard' test bilaterally.  There is normal skin warmth.  There is normal capillary refill bilaterally.    Neurologic: The patient has a positive left straight leg raise.  The patient has normal muscle strength testing for the quadriceps, calves, ankle dorsiflexion, ankle plantarflexion, and extensor hallicus longus.  The patient has sensation that is intact to light touch.  The deep tendon reflexes are normal at the patella bilaterally.    Neurological: She is alert and oriented to person, place, and time. She has normal reflexes.  Skin: Skin is warm and dry.  Psychiatric: She has a normal mood and  affect. Her behavior is normal. Thought content normal.     ED Treatments / Results  Labs (all labs ordered are listed, but only abnormal results are displayed) Labs Reviewed - No data to display  EKG  EKG Interpretation None       Radiology Dg Lumbar Spine 2-3 Views  Result Date: 03/01/2016 CLINICAL DATA:  Back pain since fall last week getting worse. EXAM: LUMBAR SPINE - 2-3 VIEW COMPARISON:  CT 11/09/2015 FINDINGS: Vertebral body alignment, heights and disc space heights are within normal. There is minimal spondylosis of the lumbar spine to include facet arthropathy. No compression fracture or subluxation per mild calcified plaque over the abdominal aorta. IMPRESSION: No acute findings. Mild spondylosis of the lumbar spine. Electronically Signed   By: Elberta Fortis M.D.   On: 03/01/2016 16:51    Procedures Procedures (including critical care time)  Medications Ordered in ED Medications - No data to display   Initial Impression / Assessment and Plan / ED Course  I have reviewed the triage vital signs and the nursing notes.  Pertinent labs & imaging results that were available during my care of the patient were reviewed by me and considered in my medical decision making (see chart for details).  Clinical Course   60 year old female with fall and acute on chronic lower back pain with left lumbar radiculopathy. No weakness or neurological deficits on exam. Patient's given a prescription for tramadol for pain. She will follow-up with pain clinic or orthopedics. Patient's x-ray show spondylosis, no acute compression deformity.   Final Clinical Impressions(s) / ED Diagnoses   Final diagnoses:  Chronic back pain  Lumbar radiculopathy, acute    New Prescriptions New Prescriptions   TRAMADOL (ULTRAM) 50 MG TABLET    Take 1 tablet (50 mg total) by mouth every 6 (six) hours as needed.     Evon Slack, PA-C 03/01/16 1734    Arnaldo Natal, MD 03/10/16 1311

## 2016-03-01 NOTE — ED Notes (Signed)
See triage note  Having increased pain since having another fall

## 2016-03-01 NOTE — ED Triage Notes (Signed)
Pt present today with c/o back pain  Pt reports that she is under the care of an orthopedic doctor and she has recently moved to this area  She experienced a fall last week and her back pain has been much worse since

## 2016-03-12 ENCOUNTER — Other Ambulatory Visit: Payer: Self-pay | Admitting: Pain Medicine

## 2016-03-13 ENCOUNTER — Other Ambulatory Visit: Payer: Self-pay | Admitting: Pain Medicine

## 2016-03-16 ENCOUNTER — Ambulatory Visit: Payer: BLUE CROSS/BLUE SHIELD | Admitting: Pain Medicine

## 2016-08-29 ENCOUNTER — Other Ambulatory Visit: Payer: Self-pay | Admitting: Pain Medicine

## 2016-10-07 ENCOUNTER — Encounter: Payer: Self-pay | Admitting: Medical Oncology

## 2016-10-07 ENCOUNTER — Emergency Department
Admission: EM | Admit: 2016-10-07 | Discharge: 2016-10-07 | Disposition: A | Discharge status: Home / Self Care | Payer: BLUE CROSS/BLUE SHIELD | Attending: Emergency Medicine | Admitting: Emergency Medicine

## 2016-10-07 DIAGNOSIS — G8929 Other chronic pain: Secondary | ICD-10-CM

## 2016-10-07 DIAGNOSIS — Z7982 Long term (current) use of aspirin: Secondary | ICD-10-CM | POA: Diagnosis not present

## 2016-10-07 DIAGNOSIS — F17210 Nicotine dependence, cigarettes, uncomplicated: Secondary | ICD-10-CM | POA: Insufficient documentation

## 2016-10-07 DIAGNOSIS — M545 Low back pain: Secondary | ICD-10-CM | POA: Diagnosis present

## 2016-10-07 DIAGNOSIS — I10 Essential (primary) hypertension: Secondary | ICD-10-CM | POA: Insufficient documentation

## 2016-10-07 DIAGNOSIS — M5442 Lumbago with sciatica, left side: Secondary | ICD-10-CM | POA: Insufficient documentation

## 2016-10-07 DIAGNOSIS — M5441 Lumbago with sciatica, right side: Secondary | ICD-10-CM | POA: Diagnosis not present

## 2016-10-07 MED ORDER — METHYLPREDNISOLONE SODIUM SUCC 125 MG IJ SOLR
125.0000 mg | Freq: Once | INTRAMUSCULAR | Status: AC
  Administered 2016-10-07: 125 mg via INTRAMUSCULAR
  Filled 2016-10-07: qty 2

## 2016-10-07 MED ORDER — PREDNISONE 10 MG (21) PO TBPK: ORAL_TABLET | Freq: Every day | ORAL | 0 refills | Status: AC

## 2016-10-07 NOTE — ED Triage Notes (Signed)
Pt reports chronic lower back pain that has worsened over past 2 weeks, no injury. Pt sees pain clinic.

## 2016-10-07 NOTE — ED Notes (Signed)
Pt states she sat on a deflated air mattress "a week ago". Pt states she has chronic low back pain. Pt states since sitting on mattress she has had "extreme back pain that the vicodin isn't helping" pt states she takes 7.5mg  vicodins as needed for pain. Pt states she spoke with her pain management MD on Thursday and he told her to come to the emergency department on Thursday. Pt with cms intact to extremities.

## 2016-10-07 NOTE — ED Provider Notes (Signed)
[facility] Emergency Department Provider Note  ____________________________________________  Time seen: Approximately 9:11 PM  I have reviewed the triage vital signs and the nursing notes.   HISTORY  Chief Complaint Back Pain    HPI Katrina Holmes is a 61 y.o. female presents to the emergency department with 8 out of 10 low back pain with bilateral lower extremity radiculopathy. Patient has a history of chronic back pain. Patient has received the results of a recent MRI of the lumbar spine, which, according to the patient, reveals that she has 5 bulging disks. Patient is under the care of pain management. Patient states that her pharmacologic regimen for chronic low back pain consists of 7-1/2 mg of Vicodin. Patient states that her next appointment with pain management is not until 11/24/2016.  Patient denies bowel or bladder incontinence as well as saddle anesthesia. Patient has been afebrile.   Past Medical History:  Diagnosis Date  . Allergy   . Anxiety   . Depression   . Hypertension   . IBS (irritable bowel syndrome) 15 years  . Suicide attempt [facility])     Patient Active Problem List   Diagnosis Date Noted  . Severe recurrent major depression without psychotic features (HCC) 12/29/2015  . Chronic pain 12/29/2015  . Involuntary commitment 12/29/2015  . DDD (degenerative disc disease), cervical 05/25/2015  . H/O cervical spine surgery 05/25/2015  . Cervical facet syndrome (HCC) 05/25/2015  . 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 DDD (degenerative disc disease), lumbar 05/25/2015  . Facet syndrome, lumbar (HCC) 05/25/2015    Past Surgical History:  Procedure Laterality Date  . ABDOMINAL HYSTERECTOMY    . NECK SURGERY  2006  . SHOULDER ARTHROSCOPY WITH ROTATOR CUFF REPAIR Left    3 surgeries (2006-2007-2008)    Prior to Admission medications   Medication Sig Start Date End Date Taking? Authorizing Provider  amitriptyline (ELAVIL) 10 MG tablet Take 10 mg  by mouth at bedtime. Reported on 12/14/2015    Historical Provider, MD  aspirin EC 81 MG tablet Take 81 mg by mouth daily.    Historical Provider, MD  ciprofloxacin (CIPRO) 250 MG tablet Take 1 tablet (250 mg total) by mouth 2 (two) times daily. Patient not taking: Reported on 02/15/2016 01/19/16   Ewing Schlein, MD  citalopram (CELEXA) 10 MG tablet Take 10 mg by mouth daily. Reported on 12/28/2015    Historical Provider, MD  diazepam (VALIUM) 5 MG tablet Take 5 mg by mouth every 8 (eight) hours as needed for anxiety. Reported on 12/28/2015    Historical Provider, MD  dicyclomine (BENTYL) 20 MG tablet Take 20 mg by mouth every 6 (six) hours as needed for spasms.    Historical Provider, MD  FLUoxetine (PROZAC) 20 MG capsule Take 1 capsule (20 mg total) by mouth daily. Patient not taking: Reported on 02/15/2016 12/29/15   Audery Amel, MD  loperamide (IMODIUM A-D) 2 MG tablet Take 1 tablet (2 mg total) by mouth 4 (four) times daily as needed for diarrhea or loose stools. 02/12/16   Anne-Caroline Sharma Covert, MD  loratadine (CLARITIN) 10 MG tablet Take 10 mg by mouth daily.    Historical Provider, MD  metoprolol succinate (TOPROL XL) 50 MG 24 hr tablet Take 50 mg by mouth daily. Take with or immediately following a meal.    Historical Provider, MD  Multiple Vitamin (MULTI-VITAMINS) TABS Take 1 tablet by mouth daily.    Historical Provider, MD  omeprazole (PRILOSEC) 20 MG capsule Take 20 mg  by mouth 2 (two) times daily as needed (for acid reflux).    Historical Provider, MD  ondansetron (ZOFRAN) 4 MG tablet Take 1 tablet (4 mg total) by mouth every 8 (eight) hours as needed for nausea or vomiting. 02/12/16 02/11/17  Rockne Menghini, MD  oxyCODONE-acetaminophen (PERCOCET) 10-325 MG tablet Limit 1 tablet by mouth 2-4 times per day if tolerated 02/15/16   Ewing Schlein, MD  predniSONE (STERAPRED UNI-PAK 21 TAB) 10 MG (21) TBPK tablet Take by mouth daily. Take 6 tablets the first day, take 5 tablets the second day,  take 4 tablets the third day, take 3 tablets the fourth day, take 2 tablets the fifth day, take 1 tablet the sixth day. 10/07/16 10/13/16  Orvil Feil, PA-C  traMADol (ULTRAM) 50 MG tablet Take 1 tablet (50 mg total) by mouth every 6 (six) hours as needed. 03/01/16   Evon Slack, PA-C    Allergies Nsaids and Penicillins  Family History  Problem Relation Age of Onset  . Heart disease Mother   . Stroke Father     Social History Social History  Substance Use Topics  . Smoking status: Current Every Day Smoker    Packs/day: 0.50    Types: Cigarettes  . Smokeless tobacco: Never Used  . Alcohol use No   Review of Systems  Constitutional: No fever/chills Eyes: No visual changes. No discharge ENT: No upper respiratory complaints. Cardiovascular: no chest pain. Respiratory: no cough. No SOB. Gastrointestinal: No abdominal pain.  No nausea, no vomiting.  No diarrhea.  No constipation. Musculoskeletal: Patient has low back pain with bilateral lower extremity radiculopathy.  Skin: Negative for rash, abrasions, lacerations, ecchymosis. Neurological: Negative for headaches, focal weakness or numbness. ____________________________________________   PHYSICAL EXAM:  VITAL SIGNS: ED Triage Vitals  Enc Vitals Group     BP 10/07/16 1837 134/84     Pulse Rate 10/07/16 1837 95     Resp 10/07/16 1837 18     Temp 10/07/16 1837 98 F (36.7 C)     Temp Source 10/07/16 1837 Oral     SpO2 10/07/16 1837 97 %     Weight 10/07/16 1837 130 lb (59 kg)     Height 10/07/16 1837  (1.676 m)     Head Circumference --      Peak Flow --      Pain Score 10/07/16 1836 8     Pain Loc --      Pain Edu? --      Excl. in GC? --     Constitutional: Alert and oriented. Well appearing and in no acute distress. Eyes: Conjunctivae are normal. PERRL. EOMI. Head: Atraumatic. Hematological/Lymphatic/Immunilogical: No cervical lymphadenopathy. Cardiovascular: Normal rate, regular rhythm. Normal S1 and  S2.  Good peripheral circulation. Respiratory: Normal respiratory effort without tachypnea or retractions. Lungs CTAB. Good air entry to the bases with no decreased or absent breath sounds. Gastrointestinal: Bowel sounds 4 quadrants. Soft and nontender to palpation. No guarding or rigidity. No palpable masses. No distention. No CVA tenderness. Musculoskeletal: Patient has 5/5 strength in the upper and lower extremities bilaterally. Full range of motion at the shoulder, elbow and wrist bilaterally. Full range of motion at the hip, knee and ankle bilaterally. No changes in gait. Palpable radial, ulnar and dorsalis pedis pulses bilaterally and symmetrically. Neurologic: Normal speech and language. No gross focal neurologic deficits are appreciated. Cranial nerves: 2-10 normal as tested. Cerebellar: Finger-nose-finger WNL, heel to shin WNL. Sensorimotor: No pronator drift, clonus, sensory loss  or abnormal reflexes. Speech: No dysarthria or expressive aphasia.  Skin:  Skin is warm, dry and intact. No rash noted. Psychiatric: Mood and affect are normal. Speech and behavior are normal. Patient exhibits appropriate insight and judgement.  ____________________________________________   LABS (all labs ordered are listed, but only abnormal results are displayed)  Labs Reviewed - No data to display ____________________________________________  EKG   ____________________________________________  RADIOLOGY   No results found.  ____________________________________________    PROCEDURES  Procedure(s) performed:    Procedures    Medications  methylPREDNISolone sodium succinate (SOLU-MEDROL) 125 mg/2 mL injection 125 mg (125 mg Intramuscular Given 10/07/16 2118)     ____________________________________________   INITIAL IMPRESSION / ASSESSMENT AND PLAN / ED COURSE  Pertinent labs & imaging results that were available during my care of the patient were reviewed by me and considered in  my medical decision making (see chart for details).  Review of the [place] CSRS was performed in accordance of the [facility] prior to dispensing any controlled drugs.     Assessment and plan: Low back pain with bilateral lower extremity radiculopathy:  Patient presents to the emergency department with chronic low back pain with bilateral lower extremity radiculopathy. Patient has an appointment with her pain management team on June 8. Patient was given an injection of Solu-Medrol in the emergency department. She was discharged but tapered prednisone. Vital signs and physical exam are reassuring at this time. All patient questions were answered. ____________________________________________  FINAL CLINICAL IMPRESSION(S) / ED DIAGNOSES  Final diagnoses:  Chronic bilateral low back pain with bilateral sciatica      NEW MEDICATIONS STARTED DURING THIS VISIT:  New Prescriptions   PREDNISONE (STERAPRED UNI-PAK 21 TAB) 10 MG (21) TBPK TABLET    Take by mouth daily. Take 6 tablets the first day, take 5 tablets the second day, take 4 tablets the third day, take 3 tablets the fourth day, take 2 tablets the fifth day, take 1 tablet the sixth day.        This chart was dictated using voice recognition software/Dragon. Despite best efforts to proofread, errors can occur which can change the meaning. Any change was purely unintentional.    Orvil Feil, PA-C 10/07/16 2128    Emily Filbert, MD 10/07/16 2210

## 2016-10-07 NOTE — ED Notes (Signed)
Pt ambulatory to desk, states "you got my discharge papers?" pt previously said she was "in so much pain I can't walk".

## 2016-10-25 ENCOUNTER — Emergency Department
Admission: EM | Admit: 2016-10-25 | Discharge: 2016-10-25 | Disposition: A | Discharge status: Home / Self Care | Payer: BLUE CROSS/BLUE SHIELD | Attending: Emergency Medicine | Admitting: Emergency Medicine

## 2016-10-25 ENCOUNTER — Encounter: Payer: Self-pay | Admitting: *Deleted

## 2016-10-25 DIAGNOSIS — Z79899 Other long term (current) drug therapy: Secondary | ICD-10-CM | POA: Diagnosis not present

## 2016-10-25 DIAGNOSIS — F17210 Nicotine dependence, cigarettes, uncomplicated: Secondary | ICD-10-CM | POA: Diagnosis not present

## 2016-10-25 DIAGNOSIS — M5440 Lumbago with sciatica, unspecified side: Secondary | ICD-10-CM | POA: Diagnosis not present

## 2016-10-25 DIAGNOSIS — I10 Essential (primary) hypertension: Secondary | ICD-10-CM | POA: Insufficient documentation

## 2016-10-25 DIAGNOSIS — M545 Low back pain: Secondary | ICD-10-CM | POA: Diagnosis present

## 2016-10-25 DIAGNOSIS — G8929 Other chronic pain: Secondary | ICD-10-CM | POA: Insufficient documentation

## 2016-10-25 NOTE — ED Triage Notes (Signed)
States lower back pain, chronic, states ruptured disc, states pain going down her legs, awake and alert

## 2016-10-25 NOTE — ED Provider Notes (Signed)
[facility] Emergency Department Provider Note   ____________________________________________   I have reviewed the triage vital signs and the nursing notes.   HISTORY  Chief Complaint Back Pain    HPI Katrina Holmes is a 61 y.o. female presents to the emergency department with 10 out of 10 low back pain with bilateral lower extremity radiculopathy. Patient has a history of chronic back pain. Patient reports having MRI results of 5 bulging disks in the lumbar spine. Patient is under the care of pain management. Patient states that her pharmacologic regimen for chronic low back pain consists of 7-1/2 mg of Vicodin. Patient states during her most recent pain management appointment she was not prescribed her usual Vicodin prescription because her psychiatrist prescribed Klonopin and her pain management provider was unable to provide her with Vicodin during the appointment. She reports taking (2) 500 mg Tylenol this morning without relief. Patient denies bowel or bladder incontinence as well as saddle anesthesia. Patient denies fever, chills, headache, vision changes, chest pain, chest tightness, shortness of breath, abdominal pain, nausea and vomiting.  Past Medical History:  Diagnosis Date  . Allergy   . Anxiety   . Depression   . Hypertension   . IBS (irritable bowel syndrome) 15 years  . Suicide attempt [facility])     Patient Active Problem List   Diagnosis Date Noted  . Severe recurrent major depression without psychotic features (HCC) 12/29/2015  . Chronic pain 12/29/2015  . Involuntary commitment 12/29/2015  . DDD (degenerative disc disease), cervical 05/25/2015  . H/O cervical spine surgery 05/25/2015  . Cervical facet syndrome (HCC) 05/25/2015  . 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 DDD (degenerative disc disease), lumbar 05/25/2015  . Facet syndrome, lumbar (HCC) 05/25/2015    Past Surgical History:  Procedure Laterality Date  . ABDOMINAL HYSTERECTOMY     . NECK SURGERY  2006  . SHOULDER ARTHROSCOPY WITH ROTATOR CUFF REPAIR Left    3 surgeries (2006-2007-2008)    Prior to Admission medications   Medication Sig Start Date End Date Taking? Authorizing Provider  amitriptyline (ELAVIL) 10 MG tablet Take 10 mg by mouth at bedtime. Reported on 12/14/2015    [provider]  aspirin EC 81 MG tablet Take 81 mg by mouth daily.    [provider]  ciprofloxacin (CIPRO) 250 MG tablet Take 1 tablet (250 mg total) by mouth 2 (two) times daily. Patient not taking: Reported on 02/15/2016 01/19/16   Ewing Schlein, MD  citalopram (CELEXA) 10 MG tablet Take 10 mg by mouth daily. Reported on 12/28/2015    [provider]  diazepam (VALIUM) 5 MG tablet Take 5 mg by mouth every 8 (eight) hours as needed for anxiety. Reported on 12/28/2015    [provider]  dicyclomine (BENTYL) 20 MG tablet Take 20 mg by mouth every 6 (six) hours as needed for spasms.    [provider]  FLUoxetine (PROZAC) 20 MG capsule Take 1 capsule (20 mg total) by mouth daily. Patient not taking: Reported on 02/15/2016 12/29/15   Clapacs, Jackquline Denmark, MD  loperamide (IMODIUM A-D) 2 MG tablet Take 1 tablet (2 mg total) by mouth 4 (four) times daily as needed for diarrhea or loose stools. 02/12/16   Rockne Menghini, MD  loratadine (CLARITIN) 10 MG tablet Take 10 mg by mouth daily.    [provider]  metoprolol succinate (TOPROL XL) 50 MG 24 hr tablet Take 50 mg by mouth daily. Take with or immediately following a  meal.    [provider]  Multiple Vitamin (MULTI-VITAMINS) TABS Take 1 tablet by mouth daily.    [provider]  omeprazole (PRILOSEC) 20 MG capsule Take 20 mg by mouth 2 (two) times daily as needed (for acid reflux).    [provider]  ondansetron (ZOFRAN) 4 MG tablet Take 1 tablet (4 mg total) by mouth every 8 (eight) hours as needed for nausea or vomiting. 02/12/16 02/11/17  Rockne MenghiniNorman, Anne-Caroline, MD    oxyCODONE-acetaminophen (PERCOCET) 10-325 MG tablet Limit 1 tablet by mouth 2-4 times per day if tolerated 02/15/16   Ewing Schleinrisp, Gregory, MD  traMADol (ULTRAM) 50 MG tablet Take 1 tablet (50 mg total) by mouth every 6 (six) hours as needed. 03/01/16   Evon SlackGaines, Thomas C, PA-C    Allergies Nsaids and Penicillins  Family History  Problem Relation Age of Onset  . Heart disease Mother   . Stroke Father     Social History Social History  Substance Use Topics  . Smoking status: Current Every Day Smoker    Packs/day: 0.50    Types: Cigarettes  . Smokeless tobacco: Never Used  . Alcohol use No    Review of Systems Constitutional: Negative fever/chills Cardiovascular: Denies chest pain. Respiratory: Denies cough Denies shortness of breath. Gastrointestinal: No abdominal pain.  No nausea, no vomiting,  diarrhea. Genitourinary: Negative dysuria. Musculoskeletal: Positive for back pain.  Positive lower extremity generalized pain. Skin: Negative for rash. Neurological: Negative for headaches.  Negative focal weakness or numbness.  ____________________________________________   PHYSICAL EXAM:  VITAL SIGNS: ED Triage Vitals  Enc Vitals Group     BP 10/25/16 1434 (!) 163/93     Pulse Rate 10/25/16 1434 91     Resp --      Temp 10/25/16 1434 97.9 F (36.6 C)     Temp Source 10/25/16 1434 Oral     SpO2 10/25/16 1434 98 %     Weight 10/25/16 1431 130 lb (59 kg)     Height 10/25/16 1431 5\' 6"  (1.676 m)     Head Circumference --      Peak Flow --      Pain Score 10/25/16 1431 8     Pain Loc --      Pain Edu? --      Excl. in GC? --     Constitutional: Alert and oriented. Well appearing and in no acute distress.  Head: Normocephalic and atraumatic. Cardiovascular: Normal rate, regular rhythm. Normal distal pulses. Respiratory: Normal respiratory effort. Lungs CTAB  Gastrointestinal: Soft and nontender. No distention. Musculoskeletal: Normal range of motion in all extremities.   Neurologic: Normal speech and language. No gross focal neurologic deficits are appreciated. No gait instability. Skin:  Skin is warm, dry and intact. No rash noted. Psychiatric: Mood and affect are normal. Patient exhibits appropriate insight and judgment. ____________________________________________   LABS (all labs ordered are listed, but only abnormal results are displayed)  Labs Reviewed - No data to display ____________________________________________  EKG no  ____________________________________________  RADIOLOGY no ____________________________________________   PROCEDURES  Procedure(s) performed: no    Critical Care performed: no ____________________________________________   INITIAL IMPRESSION / ASSESSMENT AND PLAN / ED COURSE  Pertinent labs & imaging results that were available during my care of the patient were reviewed by me and considered in my medical decision making (see chart for details).  Patient presents with chronic low back pain with bilateral lower extremity radiculopathy. Physical exam findings are reassuring no acute neurological changes. Recommended  patient take nonsteroidal medication to alleviate pain symptoms however patient refused stating she was allergic to NSAIDs. Patient's record notes NSAIDs cause nausea and vomiting. Patient was offered antiemetic along with NSAID prescription however, patient deferred. Patient elected to leave without any further treatment. Patient's husband signed discharge paperwork. Advised patient's husband to follow-up with her provider as needed and to return to the emergency department if her symptoms changed or worsened.     ____________________________________________   FINAL CLINICAL IMPRESSION(S) / ED DIAGNOSES  Final diagnoses:  Bilateral low back pain with sciatica, sciatica laterality unspecified, unspecified chronicity  Chronic low back pain with sciatica, sciatica laterality unspecified, unspecified  back pain laterality      NEW MEDICATIONS STARTED DURING THIS VISIT:  Discharge Medication List as of 10/25/2016  3:34 PM       Note:  This document was prepared using Dragon voice recognition software and may include unintentional dictation errors.   Clois Comber, PA-C 10/25/16 1719    Jene Every, MD 10/26/16 [phone]

## 2016-10-25 NOTE — ED Notes (Signed)
States she takes norco at home for chronic pain but is currently out of the norco. States she took 2-  500mg  tylenol this morning at home with no relief. Pt is due to go back to pain management in June. Pt appears to be very sleepy and states she has not taken anything else today for pain. Pt also on klonopin.

## 2016-10-27 ENCOUNTER — Other Ambulatory Visit: Payer: Self-pay | Admitting: Pain Medicine

## 2016-10-27 DIAGNOSIS — M48061 Spinal stenosis, lumbar region without neurogenic claudication: Secondary | ICD-10-CM

## 2016-10-27 DIAGNOSIS — M47816 Spondylosis without myelopathy or radiculopathy, lumbar region: Secondary | ICD-10-CM

## 2016-10-30 ENCOUNTER — Other Ambulatory Visit: Payer: Self-pay | Admitting: Pain Medicine

## 2016-10-30 DIAGNOSIS — M79606 Pain in leg, unspecified: Secondary | ICD-10-CM

## 2016-10-30 DIAGNOSIS — M4726 Other spondylosis with radiculopathy, lumbar region: Secondary | ICD-10-CM

## 2016-10-30 DIAGNOSIS — R29898 Other symptoms and signs involving the musculoskeletal system: Secondary | ICD-10-CM

## 2016-10-30 DIAGNOSIS — M47816 Spondylosis without myelopathy or radiculopathy, lumbar region: Secondary | ICD-10-CM

## 2017-01-11 ENCOUNTER — Encounter: Payer: Self-pay | Admitting: Neurology

## 2017-01-11 ENCOUNTER — Inpatient Hospital Stay
Admission: EM | Admit: 2017-01-11 | Discharge: 2017-01-12 | DRG: 897 | Disposition: A | Discharge status: Other Acute Inpatient Hospital | Payer: BLUE CROSS/BLUE SHIELD | Attending: Internal Medicine | Admitting: Internal Medicine

## 2017-01-11 DIAGNOSIS — F332 Major depressive disorder, recurrent severe without psychotic features: Secondary | ICD-10-CM | POA: Diagnosis present

## 2017-01-11 DIAGNOSIS — F1910 Other psychoactive substance abuse, uncomplicated: Secondary | ICD-10-CM | POA: Diagnosis present

## 2017-01-11 DIAGNOSIS — F10229 Alcohol dependence with intoxication, unspecified: Secondary | ICD-10-CM | POA: Diagnosis present

## 2017-01-11 DIAGNOSIS — Z818 Family history of other mental and behavioral disorders: Secondary | ICD-10-CM

## 2017-01-11 DIAGNOSIS — I959 Hypotension, unspecified: Secondary | ICD-10-CM | POA: Diagnosis present

## 2017-01-11 DIAGNOSIS — M5030 Other cervical disc degeneration, unspecified cervical region: Secondary | ICD-10-CM | POA: Diagnosis present

## 2017-01-11 DIAGNOSIS — F17210 Nicotine dependence, cigarettes, uncomplicated: Secondary | ICD-10-CM | POA: Diagnosis present

## 2017-01-11 DIAGNOSIS — M5136 Other intervertebral disc degeneration, lumbar region: Secondary | ICD-10-CM | POA: Diagnosis present

## 2017-01-11 DIAGNOSIS — I10 Essential (primary) hypertension: Secondary | ICD-10-CM | POA: Diagnosis present

## 2017-01-11 DIAGNOSIS — F1110 Opioid abuse, uncomplicated: Secondary | ICD-10-CM | POA: Diagnosis present

## 2017-01-11 DIAGNOSIS — T782XXA Anaphylactic shock, unspecified, initial encounter: Secondary | ICD-10-CM

## 2017-01-11 DIAGNOSIS — F17200 Nicotine dependence, unspecified, uncomplicated: Secondary | ICD-10-CM

## 2017-01-11 DIAGNOSIS — Z880 Allergy status to penicillin: Secondary | ICD-10-CM

## 2017-01-11 DIAGNOSIS — G8929 Other chronic pain: Secondary | ICD-10-CM | POA: Diagnosis present

## 2017-01-11 DIAGNOSIS — Z79899 Other long term (current) drug therapy: Secondary | ICD-10-CM

## 2017-01-11 DIAGNOSIS — Z7982 Long term (current) use of aspirin: Secondary | ICD-10-CM

## 2017-01-11 DIAGNOSIS — M51369 Other intervertebral disc degeneration, lumbar region without mention of lumbar back pain or lower extremity pain: Secondary | ICD-10-CM | POA: Diagnosis present

## 2017-01-11 DIAGNOSIS — R21 Rash and other nonspecific skin eruption: Secondary | ICD-10-CM | POA: Diagnosis present

## 2017-01-11 LAB — URINE DRUG SCREEN, QUALITATIVE (ARMC ONLY)
AMPHETAMINES, UR SCREEN: NOT DETECTED
BARBITURATES, UR SCREEN: NOT DETECTED
BENZODIAZEPINE, UR SCRN: NOT DETECTED
CANNABINOID 50 NG, UR ~~LOC~~: NOT DETECTED
Cocaine Metabolite,Ur ~~LOC~~: NOT DETECTED
MDMA (Ecstasy)Ur Screen: NOT DETECTED
Methadone Scn, Ur: NOT DETECTED
OPIATE, UR SCREEN: NOT DETECTED
PHENCYCLIDINE (PCP) UR S: NOT DETECTED
Tricyclic, Ur Screen: NOT DETECTED

## 2017-01-11 LAB — DIFFERENTIAL
BASOS ABS: 0 10*3/uL (ref 0–0.1)
BASOS PCT: 0 %
Eosinophils Absolute: 0.1 10*3/uL (ref 0–0.7)
Eosinophils Relative: 1 %
LYMPHS ABS: 4.3 10*3/uL — AB (ref 1.0–3.6)
LYMPHS PCT: 55 %
MONO ABS: 0.3 10*3/uL (ref 0.2–0.9)
MONOS PCT: 4 %
NEUTROS ABS: 3.2 10*3/uL (ref 1.4–6.5)
Neutrophils Relative %: 40 %

## 2017-01-11 LAB — ACETAMINOPHEN LEVEL: Acetaminophen (Tylenol), Serum: <10 ug/mL — ABNORMAL LOW (ref 10–30)

## 2017-01-11 LAB — COMPREHENSIVE METABOLIC PANEL
ALK PHOS: 77 U/L (ref 38–126)
ALT: 17 U/L (ref 14–54)
AST: 22 U/L (ref 15–41)
Albumin: 4.2 g/dL (ref 3.5–5.0)
Anion gap: 13 (ref 5–15)
BUN: 9 mg/dL (ref 6–20)
CO2: 21 mmol/L — AB (ref 22–32)
CREATININE: 0.95 mg/dL (ref 0.44–1.00)
Calcium: 9.4 mg/dL (ref 8.9–10.3)
Chloride: 105 mmol/L (ref 101–111)
GFR calc non Af Amer: >60 mL/min (ref >60)
Glucose, Bld: 97 mg/dL (ref 65–99)
Potassium: 3.3 mmol/L — ABNORMAL LOW (ref 3.5–5.1)
SODIUM: 139 mmol/L (ref 135–145)
Total Bilirubin: 0.8 mg/dL (ref 0.3–1.2)
Total Protein: 8.1 g/dL (ref 6.5–8.1)

## 2017-01-11 LAB — CBC
HCT: 47.3 % — ABNORMAL HIGH (ref 35.0–47.0)
Hemoglobin: 15.8 g/dL (ref 12.0–16.0)
MCH: 28.7 pg (ref 26.0–34.0)
MCHC: 33.3 g/dL (ref 32.0–36.0)
MCV: 86.1 fL (ref 80.0–100.0)
Platelets: 465 10*3/uL — ABNORMAL HIGH (ref 150–440)
RBC: 5.5 MIL/uL — ABNORMAL HIGH (ref 3.80–5.20)
RDW: 15 % — AB (ref 11.5–14.5)
WBC: 7.9 10*3/uL (ref 3.6–11.0)

## 2017-01-11 LAB — SALICYLATE LEVEL

## 2017-01-11 LAB — PREGNANCY, URINE: PREG TEST UR: NEGATIVE

## 2017-01-11 LAB — LACTIC ACID, PLASMA
LACTIC ACID, VENOUS: 1.7 mmol/L (ref 0.5–1.9)
Lactic Acid, Venous: 2.1 mmol/L (ref 0.5–1.9)

## 2017-01-11 LAB — POCT PREGNANCY, URINE: Preg Test, Ur: NEGATIVE

## 2017-01-11 LAB — URINALYSIS, COMPLETE (UACMP) WITH MICROSCOPIC
Bacteria, UA: NONE SEEN
Bilirubin Urine: NEGATIVE
GLUCOSE, UA: NEGATIVE mg/dL
Hgb urine dipstick: NEGATIVE
KETONES UR: NEGATIVE mg/dL
Leukocytes, UA: NEGATIVE
Nitrite: NEGATIVE
PH: 5 (ref 5.0–8.0)
Protein, ur: NEGATIVE mg/dL
RBC / HPF: NONE SEEN RBC/hpf (ref 0–5)
SPECIFIC GRAVITY, URINE: 1.004 — AB (ref 1.005–1.030)
SQUAMOUS EPITHELIAL / LPF: NONE SEEN
WBC, UA: NONE SEEN WBC/hpf (ref 0–5)

## 2017-01-11 LAB — TROPONIN I

## 2017-01-11 LAB — ETHANOL: Alcohol, Ethyl (B): 94 mg/dL — ABNORMAL HIGH (ref <5)

## 2017-01-11 MED ORDER — ONDANSETRON HCL 4 MG/2ML IJ SOLN
4.0000 mg | Freq: Once | INTRAMUSCULAR | Status: AC
  Administered 2017-01-11: 4 mg via INTRAVENOUS

## 2017-01-11 MED ORDER — ESCITALOPRAM OXALATE 10 MG PO TABS
10.0000 mg | ORAL_TABLET | Freq: Every day | ORAL | Status: DC
  Filled 2017-01-11: qty 1

## 2017-01-11 MED ORDER — EPINEPHRINE 0.3 MG/0.3ML IJ SOAJ
0.3000 mg | Freq: Once | INTRAMUSCULAR | Status: AC
  Administered 2017-01-11: 0.3 mg via INTRAMUSCULAR

## 2017-01-11 MED ORDER — EPINEPHRINE 0.3 MG/0.3ML IJ SOAJ: 0.3000 mg | Freq: Once | INTRAMUSCULAR | Status: DC

## 2017-01-11 MED ORDER — ACETAMINOPHEN 650 MG RE SUPP: 650.0000 mg | Freq: Four times a day (QID) | RECTAL | Status: DC | PRN

## 2017-01-11 MED ORDER — FAMOTIDINE IN NACL 20-0.9 MG/50ML-% IV SOLN
20.0000 mg | Freq: Once | INTRAVENOUS | Status: AC
  Administered 2017-01-11: 20 mg via INTRAVENOUS

## 2017-01-11 MED ORDER — PANTOPRAZOLE SODIUM 40 MG PO TBEC
40.0000 mg | DELAYED_RELEASE_TABLET | Freq: Two times a day (BID) | ORAL | Status: DC
  Administered 2017-01-12: 40 mg via ORAL
  Filled 2017-01-11: qty 1

## 2017-01-11 MED ORDER — METHYLPREDNISOLONE SODIUM SUCC 125 MG IJ SOLR
INTRAMUSCULAR | Status: AC
  Filled 2017-01-11: qty 2

## 2017-01-11 MED ORDER — MORPHINE SULFATE (PF) 4 MG/ML IV SOLN
4.0000 mg | Freq: Once | INTRAVENOUS | Status: AC
  Administered 2017-01-11: 4 mg via INTRAVENOUS

## 2017-01-11 MED ORDER — ONDANSETRON HCL 4 MG PO TABS: 4.0000 mg | ORAL_TABLET | Freq: Four times a day (QID) | ORAL | Status: DC | PRN

## 2017-01-11 MED ORDER — ONDANSETRON HCL 4 MG/2ML IJ SOLN: 4.0000 mg | Freq: Four times a day (QID) | INTRAMUSCULAR | Status: DC | PRN

## 2017-01-11 MED ORDER — DIPHENHYDRAMINE HCL 50 MG/ML IJ SOLN: 50.0000 mg | Freq: Four times a day (QID) | INTRAMUSCULAR | Status: DC | PRN

## 2017-01-11 MED ORDER — ADULT MULTIVITAMIN W/MINERALS CH
1.0000 | ORAL_TABLET | Freq: Every day | ORAL | Status: DC
  Administered 2017-01-12: 1 via ORAL
  Filled 2017-01-11: qty 1

## 2017-01-11 MED ORDER — MORPHINE SULFATE (PF) 4 MG/ML IV SOLN
INTRAVENOUS | Status: AC
  Administered 2017-01-11: 4 mg via INTRAVENOUS
  Filled 2017-01-11: qty 1

## 2017-01-11 MED ORDER — HYDROCODONE-ACETAMINOPHEN 7.5-325 MG PO TABS
ORAL_TABLET | ORAL | Status: AC
  Administered 2017-01-11: 1
  Filled 2017-01-11: qty 1

## 2017-01-11 MED ORDER — RISPERIDONE 1 MG PO TABS
1.0000 mg | ORAL_TABLET | Freq: Every day | ORAL | Status: DC
  Administered 2017-01-12: 1 mg via ORAL
  Filled 2017-01-11: qty 1

## 2017-01-11 MED ORDER — FAMOTIDINE IN NACL 20-0.9 MG/50ML-% IV SOLN
INTRAVENOUS | Status: AC
  Filled 2017-01-11: qty 50

## 2017-01-11 MED ORDER — FOLIC ACID 1 MG PO TABS
1.0000 mg | ORAL_TABLET | Freq: Every day | ORAL | Status: DC
  Administered 2017-01-12: 1 mg via ORAL
  Filled 2017-01-11: qty 1

## 2017-01-11 MED ORDER — VITAMIN B-1 100 MG PO TABS
100.0000 mg | ORAL_TABLET | Freq: Every day | ORAL | Status: DC
  Administered 2017-01-12: 100 mg via ORAL
  Filled 2017-01-11: qty 1

## 2017-01-11 MED ORDER — EPINEPHRINE 0.3 MG/0.3ML IJ SOAJ
INTRAMUSCULAR | Status: AC
  Filled 2017-01-11: qty 0.3

## 2017-01-11 MED ORDER — LORAZEPAM 2 MG/ML IJ SOLN: 0.0000 mg | Freq: Two times a day (BID) | INTRAMUSCULAR | Status: DC

## 2017-01-11 MED ORDER — THIAMINE HCL 100 MG/ML IJ SOLN: 100.0000 mg | Freq: Every day | INTRAMUSCULAR | Status: DC

## 2017-01-11 MED ORDER — SODIUM CHLORIDE 0.9 % IV BOLUS (SEPSIS)
1000.0000 mL | Freq: Once | INTRAVENOUS | Status: AC
  Administered 2017-01-11: 1000 mL via INTRAVENOUS

## 2017-01-11 MED ORDER — METHYLPREDNISOLONE SODIUM SUCC 125 MG IJ SOLR
125.0000 mg | Freq: Once | INTRAMUSCULAR | Status: AC
  Administered 2017-01-11: 125 mg via INTRAVENOUS

## 2017-01-11 MED ORDER — EPINEPHRINE PF 1 MG/10ML IJ SOSY
PREFILLED_SYRINGE | INTRAMUSCULAR | Status: AC
  Filled 2017-01-11: qty 10

## 2017-01-11 MED ORDER — ENOXAPARIN SODIUM 40 MG/0.4ML ~~LOC~~ SOLN: 40.0000 mg | SUBCUTANEOUS | Status: DC

## 2017-01-11 MED ORDER — LORAZEPAM 2 MG/ML IJ SOLN: 1.0000 mg | Freq: Four times a day (QID) | INTRAMUSCULAR | Status: DC | PRN

## 2017-01-11 MED ORDER — ONDANSETRON HCL 4 MG/2ML IJ SOLN
INTRAMUSCULAR | Status: AC
  Administered 2017-01-11: 4 mg via INTRAVENOUS
  Filled 2017-01-11: qty 2

## 2017-01-11 MED ORDER — EPINEPHRINE PF 1 MG/10ML IJ SOSY: 0.3000 mg | PREFILLED_SYRINGE | Freq: Once | INTRAMUSCULAR | Status: DC

## 2017-01-11 MED ORDER — ACETAMINOPHEN 325 MG PO TABS: 650.0000 mg | ORAL_TABLET | Freq: Four times a day (QID) | ORAL | Status: DC | PRN

## 2017-01-11 MED ORDER — HYDROCODONE-ACETAMINOPHEN 7.5-325 MG PO TABS: 1.0000 | ORAL_TABLET | Freq: Four times a day (QID) | ORAL | Status: DC | PRN

## 2017-01-11 MED ORDER — LORAZEPAM 2 MG/ML IJ SOLN
0.0000 mg | Freq: Four times a day (QID) | INTRAMUSCULAR | Status: DC
  Administered 2017-01-12 (×2): 2 mg via INTRAVENOUS
  Administered 2017-01-12: 4 mg via INTRAVENOUS
  Filled 2017-01-11: qty 2
  Filled 2017-01-11: qty 1
  Filled 2017-01-11: qty 2

## 2017-01-11 MED ORDER — DIPHENHYDRAMINE HCL 50 MG/ML IJ SOLN
50.0000 mg | Freq: Once | INTRAMUSCULAR | Status: AC
Start: 2017-01-11 — End: 2017-01-11
  Administered 2017-01-11: 50 mg via INTRAVENOUS

## 2017-01-11 MED ORDER — LORAZEPAM 1 MG PO TABS: 1.0000 mg | ORAL_TABLET | Freq: Four times a day (QID) | ORAL | Status: DC | PRN

## 2017-01-11 MED ORDER — ASPIRIN EC 81 MG PO TBEC
81.0000 mg | DELAYED_RELEASE_TABLET | Freq: Every day | ORAL | Status: DC
  Administered 2017-01-12: 81 mg via ORAL
  Filled 2017-01-11: qty 1

## 2017-01-11 MED ORDER — DIPHENHYDRAMINE HCL 50 MG/ML IJ SOLN
INTRAMUSCULAR | Status: AC
  Filled 2017-01-11: qty 1

## 2017-01-11 NOTE — H&P (Signed)
[facility] - Day at [facility]   PATIENT NAME: Katrina Holmes    MR#:  [id number]  DATE OF BIRTH:  29-Sep-1955  DATE OF ADMISSION:  01/11/2017  PRIMARY CARE PHYSICIAN: Tiana Loft, MD   REQUESTING/REFERRING PHYSICIAN: Darnelle Catalan, MD  CHIEF COMPLAINT:   Chief Complaint  Patient presents with  . IVC  . Medical Clearance    HISTORY OF PRESENT ILLNESS:  Ferol Laiche  is a 61 y.o. female who presents with Reported. Allergic reaction. Patient states that she started developing a rash and difficulty breathing. She is a poor historian and unable to relate her history very clearly. She states that she was handling some peanut butter, or that she may have had a reaction to some lidocaine cream. Family reported. Her husband had called her son At [employer] Earlier Today Stating That She Was "Going Crazy". They reported that she took for all of her pills" and then drank wine. The patient received Benadryl, Solu-Medrol, Pepcid, and her allergic symptoms resolved. However, she has had some uncharacteristic behavior here. Alcohol level is 94, consistent with alcohol consumption. However, her clinical status is not consistent with having taken a large amount of her home meds, which include antidepressants, benzodiazepines, pain medications. Psychiatry recommended IVC for further consult, hospitalists were called for medical admission.  PAST MEDICAL HISTORY:   Past Medical History:  Diagnosis Date  . Allergy   . Anxiety   . Depression   . Hypertension   . IBS (irritable bowel syndrome) 15 years  . Suicide attempt [facility])     PAST SURGICAL HISTORY:   Past Surgical History:  Procedure Laterality Date  . ABDOMINAL HYSTERECTOMY    . NECK SURGERY  2006  . SHOULDER ARTHROSCOPY WITH ROTATOR CUFF REPAIR Left    3 surgeries (2006-2007-2008)    SOCIAL HISTORY:   Social History  Substance Use Topics  . Smoking status: Current Every Day Smoker    Packs/day: 0.50   Types: Cigarettes  . Smokeless tobacco: Never Used  . Alcohol use No    FAMILY HISTORY:   Family History  Problem Relation Age of Onset  . Heart disease Mother   . Stroke Father     DRUG ALLERGIES:   Allergies  Allergen Reactions  . Nsaids Nausea And Vomiting  . Penicillins Rash    .Has patient had a PCN reaction causing immediate rash, facial/tongue/throat swelling, SOB or lightheadedness with hypotension: Unknown Has patient had a PCN reaction causing severe rash involving mucus membranes or skin necrosis: Unknown Has patient had a PCN reaction that required hospitalization: Unknown Has patient had a PCN reaction occurring within the last 10 years: Unknown If all of the above answers are "NO", then may proceed with Cephalosporin use.     MEDICATIONS AT HOME:   Prior to Admission medications   Medication Sig Start Date End Date Taking? Authorizing Provider  aspirin EC 81 MG tablet Take 81 mg by mouth daily.   Yes [provider]  clonazePAM (KLONOPIN) 0.5 MG tablet Take 0.5 mg by mouth 3 (three) times daily. 01/10/17  Yes [provider]  escitalopram (LEXAPRO) 10 MG tablet Take 10 mg by mouth daily. 10/05/16  Yes [provider]  HYDROcodone-acetaminophen (NORCO) 7.5-325 MG tablet Take 1 tablet by mouth every 6 (six) hours as needed for pain. 12/25/16  Yes [provider]  loperamide (IMODIUM A-D) 2 MG tablet Take 1 tablet (2 mg total) by mouth 4 (four) times daily as needed for diarrhea  or loose stools. 02/12/16  Yes Rockne MenghiniNorman, Anne-Caroline, MD  loratadine (CLARITIN) 10 MG tablet Take 10 mg by mouth daily.   Yes [provider]  Multiple Vitamin (MULTI-VITAMINS) TABS Take 1 tablet by mouth daily.   Yes [provider]  ondansetron (ZOFRAN) 4 MG tablet Take 1 tablet (4 mg total) by mouth every 8 (eight) hours as needed for nausea or vomiting. 02/12/16 02/11/17 Yes Rockne MenghiniNorman, Anne-Caroline, MD  risperiDONE (RISPERDAL) 1 MG tablet Take  1 mg by mouth daily. 01/10/17  Yes [provider]  metoprolol succinate (TOPROL XL) 50 MG 24 hr tablet Take 50 mg by mouth daily. Take with or immediately following a meal.    [provider]  omeprazole (PRILOSEC) 20 MG capsule Take 20 mg by mouth 2 (two) times daily as needed (for acid reflux).    [provider]    REVIEW OF SYSTEMS:  Review of Systems  Constitutional: Negative for chills, fever, malaise/fatigue and weight loss.  HENT: Negative for ear pain, hearing loss and tinnitus.   Eyes: Negative for blurred vision, double vision, pain and redness.  Respiratory: Positive for shortness of breath. Negative for cough and hemoptysis.   Cardiovascular: Negative for chest pain, palpitations, orthopnea and leg swelling.  Gastrointestinal: Negative for abdominal pain, constipation, diarrhea, nausea and vomiting.  Genitourinary: Negative for dysuria, frequency and hematuria.  Musculoskeletal: Negative for back pain, joint pain and neck pain.  Skin: Positive for rash.  Neurological: Negative for dizziness, tremors, focal weakness and weakness.  Endo/Heme/Allergies: Negative for polydipsia. Does not bruise/bleed easily.  Psychiatric/Behavioral: Negative for depression. The patient is not nervous/anxious and does not have insomnia.      VITAL SIGNS:   Vitals:   01/11/17 2000 01/11/17 2030 01/11/17 2035 01/11/17 2100  BP: 135/82 111/78 111/78 105/74  Pulse:   91 89  Resp: (!) 37 (!) 24 18   Temp:      TempSrc:      SpO2:   96% 95%  Weight:      Height:       Wt Readings from Last 3 Encounters:  01/11/17 59 kg (130 lb)  10/25/16 59 kg (130 lb)  10/07/16 59 kg (130 lb)    PHYSICAL EXAMINATION:  Physical Exam  Vitals reviewed. Constitutional: She is oriented to person, place, and time. She appears well-developed and well-nourished. No distress.  HENT:  Head: Normocephalic and atraumatic.  Mouth/Throat: Oropharynx is clear and moist.  Eyes: Pupils are  equal, round, and reactive to light. Conjunctivae and EOM are normal. No scleral icterus.  Neck: Normal range of motion. Neck supple. No JVD present. No thyromegaly present.  Cardiovascular: Normal rate, regular rhythm and intact distal pulses.  Exam reveals no gallop and no friction rub.   No murmur heard. Respiratory: Effort normal and breath sounds normal. No respiratory distress. She has no wheezes. She has no rales.  GI: Soft. Bowel sounds are normal. She exhibits no distension. There is no tenderness.  Musculoskeletal: Normal range of motion. She exhibits no edema.  No arthritis, no gout  Lymphadenopathy:    She has no cervical adenopathy.  Neurological: She is alert and oriented to person, place, and time. No cranial nerve deficit.  No dysarthria, no aphasia  Skin: Skin is warm and dry. No rash noted. No erythema.  Psychiatric: She has a normal mood and affect. Her behavior is normal. Judgment and thought content normal.    LABORATORY PANEL:   CBC  Recent Labs Lab 01/11/17 1702  WBC 7.9  HGB 15.8  HCT 47.3*  PLT 465*   ------------------------------------------------------------------------------------------------------------------  Chemistries   Recent Labs Lab 01/11/17 1702  NA 139  K 3.3*  CL 105  CO2 21*  GLUCOSE 97  BUN 9  CREATININE 0.95  CALCIUM 9.4  AST 22  ALT 17  ALKPHOS 77  BILITOT 0.8   ------------------------------------------------------------------------------------------------------------------  Cardiac Enzymes  Recent Labs Lab 01/11/17 1702  TROPONINI <0.03   ------------------------------------------------------------------------------------------------------------------  RADIOLOGY:  No results found.  EKG:   Orders placed or performed during the hospital encounter of 01/11/17  . EKG 12-Lead  . EKG 12-Lead  . ED EKG  . ED EKG    IMPRESSION AND PLAN:  Principal Problem:   Anaphylaxis - symptoms resolved with treatment  given in the ED, we will admit her for observation tonight to ensure there is no recurrence. Active Problems:   Alcohol intoxication - CIWA protocol with when necessary Ativan per protocol   Severe recurrent major depression without psychotic features (HCC) - unclear story whether the patient, medications in excess or not. She denies suicidal intent. Psychiatric consult   Rash - seems to resolve, continue to monitor   HTN (hypertension) - continue home meds  All the records are reviewed and case discussed with ED provider. Management plans discussed with the patient and/or family.  DVT PROPHYLAXIS: SubQ lovenox  GI PROPHYLAXIS: None  ADMISSION STATUS: Inpatient  CODE STATUS: Full Code Status History    This patient does not have a recorded code status. Please follow your organizational policy for patients in this situation.      TOTAL TIME TAKING CARE OF THIS PATIENT: 45 minutes.   Corrie Brannen FIELDING 01/11/2017, 9:07 PM  [facility]  Office  [phone]  CC: Primary care physician; Tiana Loftabellon, Melissa, MD  Note:  This document was prepared using Dragon voice recognition software and may include unintentional dictation errors.

## 2017-01-11 NOTE — ED Notes (Signed)
All belongings removed and at nursing desk currently. Dentures remain in room in cup

## 2017-01-11 NOTE — ED Triage Notes (Signed)
Pt presents with BPD and IVC papers. Pt son found pt and called his father to let him know that his mother was acting crazy. Pt is reported to have taken the majority of her medications and drank a glass of wine. Pt appears angry in triage. Pt states she took klonopin which was prescribed today, omeprazole, aspirin. Pt states she only drank one glass of wine and one glass of water. Pt denies SI.

## 2017-01-11 NOTE — ED Notes (Signed)
Pt pulled out both IV's and monitor. Getting out of bed to try and use bathroom. Remains oriented. Bed alarm now placed on pt.  Has yellow socks on. Call bell remains at bedside. Monitor reapplied.

## 2017-01-11 NOTE — ED Notes (Signed)
Date and time results received: 01/11/17 1803 (use smartphrase ".now" to insert current time)  Test: lactic Critical Value: 2.1  Name of Provider Notified: Darnelle Catalanmalinda

## 2017-01-11 NOTE — ED Notes (Signed)
VOL/SOC ordered/called waiting on Md to evaluate

## 2017-01-11 NOTE — BH Assessment (Signed)
Assessment Note  Katrina Holmes is an 61 y.o. female presenting to the ED under [place] [employer].  According to the IVC, pt's son came to his parent's home and found his mother acting abnormal.  According to the son, pt reportedly took the majority of her medications and drank a glass of wine.    Pt denies the allegations and states that she was having an allergic reaction after opening a jar of peanut butter. She denies SI/HI and any auditory/visual hallucinations.   She denies receiving any outpatient mental health treatment.  Diagnosis: Major Depressive Disorder  Past Medical History:  Past Medical History:  Diagnosis Date  . Allergy   . Anxiety   . Depression   . Hypertension   . IBS (irritable bowel syndrome) 15 years  . Suicide attempt [facility](HCC)     Past Surgical History:  Procedure Laterality Date  . ABDOMINAL HYSTERECTOMY    . NECK SURGERY  2006  . SHOULDER ARTHROSCOPY WITH ROTATOR CUFF REPAIR Left    3 surgeries (2006-2007-2008)    Family History:  Family History  Problem Relation Age of Onset  . Heart disease Mother   . Stroke Father     Social History:  reports that she has been smoking Cigarettes.  She has been smoking about 0.50 packs per day. She has never used smokeless tobacco. She reports that she does not drink alcohol or use drugs.  Additional Social History:  Alcohol / Drug Use Pain Medications: See PTA Prescriptions: See PTA Over the Counter: See PTA History of alcohol / drug use?: No history of alcohol / drug abuse  CIWA: CIWA-Ar BP: 111/78 Pulse Rate: 91 COWS:    Allergies:  Allergies  Allergen Reactions  . Nsaids Nausea And Vomiting  . Penicillins Rash    .Has patient had a PCN reaction causing immediate rash, facial/tongue/throat swelling, SOB or lightheadedness with hypotension: Unknown Has patient had a PCN reaction causing severe rash involving mucus membranes or skin necrosis: Unknown Has patient had a PCN reaction that  required hospitalization: Unknown Has patient had a PCN reaction occurring within the last 10 years: Unknown If all of the above answers are "NO", then may proceed with Cephalosporin use.     Home Medications:  (Not in a hospital admission)  OB/GYN Status:  No LMP recorded. Patient has had a hysterectomy.  General Assessment Data [facility] Assessment: In system Is this a Tele or Face-to-Face Assessment?: Face-to-Face Is this an Initial Assessment or a Re-assessment for this encounter?: Initial Assessment Marital status: Married Is patient pregnant?: No Pregnancy Status: No Living Arrangements: Spouse/significant other Can pt return to current living arrangement?: Yes Admission Status: Involuntary Is patient capable of signing voluntary admission?: No (Pt was IVC) Referral Source: Self/Family/Friend Insurance type: BCBS     Crisis Care Plan Living Arrangements: Spouse/significant other Legal Guardian: Other: (self) Name of Psychiatrist: none reported Name of Therapist: none reported  Education Status Is patient currently in school?: No Current Grade: n/a Highest grade of school patient has completed: 12 Name of school: n/a Contact person: n/a  Risk to self with the past 6 months Suicidal Ideation: No Has patient been a risk to self within the past 6 months prior to admission? : No Suicidal Intent: No Has patient had any suicidal intent within the past 6 months prior to admission? : No Is patient at risk for suicide?: No Suicidal Plan?: No Has patient had any suicidal plan within the  past 6 months prior to admission? : No Access to Means: No What has been your use of drugs/alcohol within the last 12 months?: P denies drug/alcohol use Previous Attempts/Gestures: No Other Self Harm Risks: None identified Triggers for Past Attempts: None known Intentional Self Injurious Behavior: None Family Suicide History: No Recent stressful life  event(s): Conflict (Comment), Recent negative physical changes (conflict with family) Persecutory voices/beliefs?: No Depression: Yes Depression Symptoms: Loss of interest in usual pleasures, Feeling worthless/self pity, Isolating Substance abuse history and/or treatment for substance abuse?: Yes Suicide prevention information given to non-admitted patients: Not applicable  Risk to Others within the past 6 months Homicidal Ideation: No Does patient have any lifetime risk of violence toward others beyond the six months prior to admission? : No Thoughts of Harm to Others: No Current Homicidal Intent: No Current Homicidal Plan: No Access to Homicidal Means: No Identified Victim: none identified History of harm to others?: No Assessment of Violence: None Noted Violent Behavior Description: none identified Does patient have access to weapons?: No Criminal Charges Pending?: No Does patient have a court date: No Is patient on probation?: No  Psychosis Hallucinations: None noted Delusions: None noted  Mental Status Report Appearance/Hygiene: In scrubs Eye Contact: Fair Motor Activity: Freedom of movement Speech: Logical/coherent Level of Consciousness: Drowsy, Irritable Mood: Irritable Affect: Irritable Anxiety Level: Minimal Thought Processes: Relevant Judgement: Partial Orientation: Person, Place, Time, Situation Obsessive Compulsive Thoughts/Behaviors: None  Cognitive Functioning Concentration: Good Memory: Remote Intact IQ: Average Insight: Fair Impulse Control: Fair Appetite: Fair Sleep: No Change Vegetative Symptoms: None  ADLScreening [facility] Assessment Services) Patient's cognitive ability adequate to safely complete daily activities?: Yes Patient able to express need for assistance with ADLs?: Yes Independently performs ADLs?: Yes (appropriate for developmental age)  Prior Inpatient Therapy Prior Inpatient Therapy: Yes Prior Therapy Dates: 2000 Prior Therapy  Facilty/Provider(s): [facility] Reason for Treatment: Depression  Prior Outpatient Therapy Prior Outpatient Therapy: No Prior Therapy Dates: n/a Prior Therapy Facilty/Provider(s): n/a Reason for Treatment: n/a Does patient have an ACCT team?: No Does patient have Intensive In-House Services?  : No Does patient have [facility] services? : No Does patient have P4CC services?: No  ADL Screening (condition at time of admission) Patient's cognitive ability adequate to safely complete daily activities?: Yes Patient able to express need for assistance with ADLs?: Yes Independently performs ADLs?: Yes (appropriate for developmental age)       Abuse/Neglect Assessment (Assessment to be complete while patient is alone) Physical Abuse: Denies Verbal Abuse: Denies Sexual Abuse: Denies Exploitation of patient/patient's resources: Denies Self-Neglect: Denies Values / Beliefs Cultural Requests During Hospitalization: None Spiritual Requests During Hospitalization: None Consults Spiritual Care Consult Needed: No Social Work Consult Needed: No Merchant navy officerAdvance Directives (For Healthcare) Does Patient Have a Medical Advance Directive?: No Would patient like information on creating a medical advance directive?: No - Patient declined    Additional Information 1:1 In Past 12 Months?: No CIRT Risk: No Elopement Risk: No Does patient have medical clearance?: No     Disposition:  Disposition Initial Assessment Completed for this Encounter: Yes Disposition of Patient: Other dispositions Other disposition(s): Other (Comment) [facility]  On Site Evaluation by:   Reviewed with Physician:    Artist Beachoxana C Kindel Rochefort 01/11/2017 8:46 PM

## 2017-01-11 NOTE — ED Notes (Signed)
Pt resting well, wakes to voice. respirations unlabored. Rash almost gone. Feels better.

## 2017-01-11 NOTE — ED Provider Notes (Signed)
[facility] Emergency Department Provider Note   ____________________________________________   First MD Initiated Contact with Patient 01/11/17 1707     (approximate)  I have reviewed the triage vital signs and the nursing notes.   HISTORY  Chief Complaint IVC and Medical Clearance   HPI Katrina Holmes is a 61 y.o. female patient comes in hypertensive under commitment with papers that say the son received a call from his father stating that his mother is "going crazy". Arriving at the respondent residences unfounded the respondent had taken the majority of her prescription medicines and dry and normal wind. Spinous stated that she "might as well kill myself lead to worry about me". On arrival here patient was blood pressures in the 70s. Patient has a fine red rash is very itchy all over her. She is awake alert and denies the above story. She says she closed the peanut butter bottle that she had opened she is extremely allergic to peanut butter. She thinks she is having an allergic reaction. She says her tongue feels like it is swelling and she is wheezing.   Past Medical History:  Diagnosis Date  . Allergy   . Anxiety   . Depression   . Hypertension   . IBS (irritable bowel syndrome) 15 years  . Suicide attempt [facility](HCC)     Patient Active Problem List   Diagnosis Date Noted  . Severe recurrent major depression without psychotic features (HCC) 12/29/2015  . Chronic pain 12/29/2015  . Involuntary commitment 12/29/2015  . DDD (degenerative disc disease), cervical 05/25/2015  . H/O cervical spine surgery 05/25/2015  . Cervical facet syndrome (HCC) 05/25/2015  . 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 DDD (degenerative disc disease), lumbar 05/25/2015  . Facet syndrome, lumbar (HCC) 05/25/2015    Past Surgical History:  Procedure Laterality Date  . ABDOMINAL HYSTERECTOMY    . NECK SURGERY  2006  . SHOULDER ARTHROSCOPY WITH ROTATOR CUFF REPAIR Left    3  surgeries (2006-2007-2008)    Prior to Admission medications   Medication Sig Start Date End Date Taking? Authorizing Provider  aspirin EC 81 MG tablet Take 81 mg by mouth daily.   Yes [provider]  clonazePAM (KLONOPIN) 0.5 MG tablet Take 0.5 mg by mouth 3 (three) times daily. 01/10/17  Yes [provider]  escitalopram (LEXAPRO) 10 MG tablet Take 10 mg by mouth daily. 10/05/16  Yes [provider]  HYDROcodone-acetaminophen (NORCO) 7.5-325 MG tablet Take 1 tablet by mouth every 6 (six) hours as needed for pain. 12/25/16  Yes [provider]  loperamide (IMODIUM A-D) 2 MG tablet Take 1 tablet (2 mg total) by mouth 4 (four) times daily as needed for diarrhea or loose stools. 02/12/16  Yes Rockne MenghiniNorman, Anne-Caroline, MD  loratadine (CLARITIN) 10 MG tablet Take 10 mg by mouth daily.   Yes [provider]  Multiple Vitamin (MULTI-VITAMINS) TABS Take 1 tablet by mouth daily.   Yes [provider]  ondansetron (ZOFRAN) 4 MG tablet Take 1 tablet (4 mg total) by mouth every 8 (eight) hours as needed for nausea or vomiting. 02/12/16 02/11/17 Yes Rockne MenghiniNorman, Anne-Caroline, MD  risperiDONE (RISPERDAL) 1 MG tablet Take 1 mg by mouth daily. 01/10/17  Yes [provider]  ciprofloxacin (CIPRO) 250 MG tablet Take 1 tablet (250 mg total) by mouth 2 (two) times daily. Patient not taking: Reported on 02/15/2016 01/19/16   Ewing Schleinrisp, Gregory, MD  FLUoxetine (PROZAC) 20 MG capsule Take 1 capsule (20 mg total)  by mouth daily. Patient not taking: Reported on 02/15/2016 12/29/15   Clapacs, Jackquline Denmark, MD  metoprolol succinate (TOPROL XL) 50 MG 24 hr tablet Take 50 mg by mouth daily. Take with or immediately following a meal.    [provider]  omeprazole (PRILOSEC) 20 MG capsule Take 20 mg by mouth 2 (two) times daily as needed (for acid reflux).    [provider]  oxyCODONE-acetaminophen (PERCOCET) 10-325 MG tablet Limit 1 tablet by mouth 2-4 times per day if  tolerated Patient not taking: Reported on 01/11/2017 02/15/16   Ewing Schlein, MD  traMADol (ULTRAM) 50 MG tablet Take 1 tablet (50 mg total) by mouth every 6 (six) hours as needed. Patient not taking: Reported on 01/11/2017 03/01/16   Evon Slack, PA-C    Allergies Nsaids and Penicillins  Family History  Problem Relation Age of Onset  . Heart disease Mother   . Stroke Father     Social History Social History  Substance Use Topics  . Smoking status: Current Every Day Smoker    Packs/day: 0.50    Types: Cigarettes  . Smokeless tobacco: Never Used  . Alcohol use No    Review of Systems  Constitutional: No fever/chills Eyes: No visual changes. ENT: No sore throat. Cardiovascular: Denies chest pain. Respiratory:  shortness of breath. Gastrointestinal: No abdominal pain.  No nausea, no vomiting.  No diarrhea.  No constipation. Genitourinary: Negative for dysuria. Musculoskeletal: Negative for back pain. Skin: Negative for rash. Neurological: Negative for headaches, focal weakness or numbness.   ____________________________________________   PHYSICAL EXAM:  VITAL SIGNS: ED Triage Vitals  Enc Vitals Group     BP 01/11/17 1643 (!) 76/40     Pulse Rate 01/11/17 1643 (!) 117     Resp 01/11/17 1643 18     Temp 01/11/17 1643 97.9 F (36.6 C)     Temp Source 01/11/17 1643 Oral     SpO2 01/11/17 1643 97 %     Weight 01/11/17 1641 130 lb (59 kg)     Height 01/11/17 1641 5\' 6"  (1.676 m)     Head Circumference --      Peak Flow --      Pain Score 01/11/17 1641 8     Pain Loc --      Pain Edu? --      Excl. in GC? --     Constitutional: Alert and oriented. Well appearing and in no acute distress. Eyes: Conjunctivae are normal.  Head: Atraumatic. Nose: No congestion/rhinnorhea. Mouth/Throat: Mucous membranes are moist.  Oropharynx non-erythematous.Tongue seems slightly full Neck: No stridor.  Cardiovascular: Normal rate, regular rhythm. Grossly normal heart sounds.   Good peripheral circulation. Respiratory: Normal respiratory effort.  No retractions. Lungs diffuse wheezing. Gastrointestinal: Soft and nontender. No distention. No abdominal bruits. No CVA tenderness. Musculoskeletal: No lower extremity tenderness nor edema.  No joint effusions. Neurologic:  Normal speech and language. No gross focal neurologic deficits are appreciated. No gait instability. Skin:  Skin is warm, dry and intact. Fine red itchy rash over the whole body. Psychiatric: Mood and affect are normal. Speech and behavior are normal.  ____________________________________________   LABS (all labs ordered are listed, but only abnormal results are displayed)  Labs Reviewed  COMPREHENSIVE METABOLIC PANEL - Abnormal; Notable for the following:       Result Value   Potassium 3.3 (*)    CO2 21 (*)    All other components within normal limits  ETHANOL - Abnormal; Notable  for the following:    Alcohol, Ethyl (B) 94 (*)    All other components within normal limits  CBC - Abnormal; Notable for the following:    RBC 5.50 (*)    HCT 47.3 (*)    RDW 15.0 (*)    Platelets 465 (*)    All other components within normal limits  ACETAMINOPHEN LEVEL - Abnormal; Notable for the following:    Acetaminophen (Tylenol), Serum <10 (*)    All other components within normal limits  LACTIC ACID, PLASMA - Abnormal; Notable for the following:    Lactic Acid, Venous 2.1 (*)    All other components within normal limits  DIFFERENTIAL - Abnormal; Notable for the following:    Lymphs Abs 4.3 (*)    All other components within normal limits  SALICYLATE LEVEL  URINE DRUG SCREEN, QUALITATIVE ([facility] ONLY)  TROPONIN I  LACTIC ACID, PLASMA  CBC WITH DIFFERENTIAL/PLATELET  PREGNANCY, URINE  URINALYSIS, COMPLETE (UACMP) WITH MICROSCOPIC  URINE DRUG SCREEN, QUALITATIVE ([facility] ONLY)    ____________________________________________  EKG   ____________________________________________  RADIOLOGY   ____________________________________________   PROCEDURES  Procedure(s) performed: Patient got IM epinephrine and fluids and Benadryl's Zantac and Solu-Medrol IV patient's blood pressure is now coming up last I saw was 99 systolic patient is awake alert says her tongue is feeling less swollen.  Procedures  Critical Care performed: Critical care time 45 minutes involving bedside care and supervision of medication administration and repeated visits afterwards  ____________________________________________   INITIAL IMPRESSION / ASSESSMENT AND PLAN / ED COURSE  Pertinent labs & imaging results that were available during my care of the patient were reviewed by me and considered in my medical decision making (see chart for details). Patient is better at 645 blood pressure remains on the low side. Patient's alcohol level was not consistent with her having drank a bottle of wine. We will recommend observation and fluids overnight. Psych consult is in the computer.     ____________________________________________   FINAL CLINICAL IMPRESSION(S) / ED DIAGNOSES  Final diagnoses:  Anaphylaxis, initial encounter  Hypotension, unspecified hypotension type      NEW MEDICATIONS STARTED DURING THIS VISIT:  New Prescriptions   No medications on file     Note:  This document was prepared using Dragon voice recognition software and may include unintentional dictation errors.    Arnaldo NatalMalinda, Aqsa Sensabaugh F, MD 01/11/17 [phone]

## 2017-01-12 ENCOUNTER — Inpatient Hospital Stay
Admission: EM | Admit: 2017-01-12 | Discharge: 2017-01-15 | DRG: 885 | Disposition: A | Discharge status: Home / Self Care | Payer: BLUE CROSS/BLUE SHIELD | Attending: Psychiatry | Admitting: Psychiatry

## 2017-01-12 ENCOUNTER — Encounter: Payer: Self-pay | Admitting: Neurology

## 2017-01-12 DIAGNOSIS — F1120 Opioid dependence, uncomplicated: Secondary | ICD-10-CM | POA: Diagnosis present

## 2017-01-12 DIAGNOSIS — R21 Rash and other nonspecific skin eruption: Secondary | ICD-10-CM | POA: Diagnosis present

## 2017-01-12 DIAGNOSIS — F1320 Sedative, hypnotic or anxiolytic dependence, uncomplicated: Secondary | ICD-10-CM

## 2017-01-12 DIAGNOSIS — I10 Essential (primary) hypertension: Secondary | ICD-10-CM | POA: Diagnosis present

## 2017-01-12 DIAGNOSIS — Z7982 Long term (current) use of aspirin: Secondary | ICD-10-CM | POA: Diagnosis not present

## 2017-01-12 DIAGNOSIS — M4696 Unspecified inflammatory spondylopathy, lumbar region: Secondary | ICD-10-CM | POA: Diagnosis present

## 2017-01-12 DIAGNOSIS — F17200 Nicotine dependence, unspecified, uncomplicated: Secondary | ICD-10-CM

## 2017-01-12 DIAGNOSIS — M5136 Other intervertebral disc degeneration, lumbar region: Secondary | ICD-10-CM | POA: Diagnosis present

## 2017-01-12 DIAGNOSIS — F17210 Nicotine dependence, cigarettes, uncomplicated: Secondary | ICD-10-CM | POA: Diagnosis present

## 2017-01-12 DIAGNOSIS — M5030 Other cervical disc degeneration, unspecified cervical region: Secondary | ICD-10-CM | POA: Diagnosis present

## 2017-01-12 DIAGNOSIS — M19019 Primary osteoarthritis, unspecified shoulder: Secondary | ICD-10-CM | POA: Diagnosis present

## 2017-01-12 DIAGNOSIS — F332 Major depressive disorder, recurrent severe without psychotic features: Secondary | ICD-10-CM

## 2017-01-12 DIAGNOSIS — Z880 Allergy status to penicillin: Secondary | ICD-10-CM | POA: Diagnosis not present

## 2017-01-12 DIAGNOSIS — F1021 Alcohol dependence, in remission: Secondary | ICD-10-CM | POA: Diagnosis present

## 2017-01-12 DIAGNOSIS — R410 Disorientation, unspecified: Secondary | ICD-10-CM | POA: Diagnosis present

## 2017-01-12 DIAGNOSIS — T424X1A Poisoning by benzodiazepines, accidental (unintentional), initial encounter: Secondary | ICD-10-CM | POA: Diagnosis present

## 2017-01-12 DIAGNOSIS — Z90710 Acquired absence of both cervix and uterus: Secondary | ICD-10-CM

## 2017-01-12 DIAGNOSIS — Z818 Family history of other mental and behavioral disorders: Secondary | ICD-10-CM

## 2017-01-12 DIAGNOSIS — Z79899 Other long term (current) drug therapy: Secondary | ICD-10-CM | POA: Diagnosis not present

## 2017-01-12 LAB — BASIC METABOLIC PANEL
ANION GAP: 6 (ref 5–15)
BUN: 13 mg/dL (ref 6–20)
CHLORIDE: 110 mmol/L (ref 101–111)
CO2: 24 mmol/L (ref 22–32)
Calcium: 8.8 mg/dL — ABNORMAL LOW (ref 8.9–10.3)
Creatinine, Ser: 0.64 mg/dL (ref 0.44–1.00)
GFR calc non Af Amer: >60 mL/min (ref >60)
Glucose, Bld: 121 mg/dL — ABNORMAL HIGH (ref 65–99)
POTASSIUM: 4.5 mmol/L (ref 3.5–5.1)
SODIUM: 140 mmol/L (ref 135–145)

## 2017-01-12 LAB — CBC
HEMATOCRIT: 37.2 % (ref 35.0–47.0)
HEMOGLOBIN: 12.5 g/dL (ref 12.0–16.0)
MCH: 29.3 pg (ref 26.0–34.0)
MCHC: 33.7 g/dL (ref 32.0–36.0)
MCV: 86.8 fL (ref 80.0–100.0)
Platelets: 358 10*3/uL (ref 150–440)
RBC: 4.29 MIL/uL (ref 3.80–5.20)
RDW: 14.9 % — ABNORMAL HIGH (ref 11.5–14.5)
WBC: 11.9 10*3/uL — AB (ref 3.6–11.0)

## 2017-01-12 MED ORDER — HALOPERIDOL LACTATE 5 MG/ML IJ SOLN: 2.0000 mg | Freq: Four times a day (QID) | INTRAMUSCULAR | Status: DC | PRN

## 2017-01-12 MED ORDER — NICOTINE POLACRILEX 2 MG MT GUM: 2.0000 mg | CHEWING_GUM | OROMUCOSAL | Status: DC | PRN

## 2017-01-12 MED ORDER — NICOTINE 21 MG/24HR TD PT24
21.0000 mg | MEDICATED_PATCH | Freq: Every day | TRANSDERMAL | Status: DC
  Administered 2017-01-12: 21 mg via TRANSDERMAL
  Filled 2017-01-12: qty 1

## 2017-01-12 MED ORDER — HYDROCODONE-ACETAMINOPHEN 7.5-325 MG PO TABS
1.0000 | ORAL_TABLET | Freq: Three times a day (TID) | ORAL | Status: DC
  Administered 2017-01-12 – 2017-01-13 (×3): 1 via ORAL
  Filled 2017-01-12 (×3): qty 1

## 2017-01-12 MED ORDER — ACETAMINOPHEN 325 MG PO TABS
650.0000 mg | ORAL_TABLET | Freq: Four times a day (QID) | ORAL | Status: DC | PRN
  Administered 2017-01-12 – 2017-01-15 (×3): 650 mg via ORAL
  Filled 2017-01-12 (×3): qty 2

## 2017-01-12 MED ORDER — PANTOPRAZOLE SODIUM 40 MG PO TBEC
40.0000 mg | DELAYED_RELEASE_TABLET | Freq: Two times a day (BID) | ORAL | Status: DC
  Administered 2017-01-12 – 2017-01-15 (×6): 40 mg via ORAL
  Filled 2017-01-12 (×6): qty 1

## 2017-01-12 MED ORDER — OLANZAPINE 5 MG PO TBDP
10.0000 mg | ORAL_TABLET | Freq: Two times a day (BID) | ORAL | Status: DC | PRN
  Administered 2017-01-12: 10 mg via ORAL
  Filled 2017-01-12: qty 2

## 2017-01-12 MED ORDER — HALOPERIDOL LACTATE 5 MG/ML IJ SOLN
5.0000 mg | Freq: Once | INTRAMUSCULAR | Status: AC
  Administered 2017-01-12: 5 mg via INTRAVENOUS
  Filled 2017-01-12: qty 1

## 2017-01-12 MED ORDER — HALOPERIDOL 0.5 MG PO TABS: 0.5000 mg | ORAL_TABLET | Freq: Four times a day (QID) | ORAL | 0 refills | Status: DC

## 2017-01-12 MED ORDER — HALOPERIDOL 0.5 MG PO TABS
0.5000 mg | ORAL_TABLET | Freq: Four times a day (QID) | ORAL | Status: DC
  Administered 2017-01-12: 0.5 mg via ORAL
  Filled 2017-01-12 (×3): qty 1

## 2017-01-12 MED ORDER — OLANZAPINE 10 MG IM SOLR: 10.0000 mg | Freq: Two times a day (BID) | INTRAMUSCULAR | Status: DC | PRN

## 2017-01-12 MED ORDER — HYDROCODONE-ACETAMINOPHEN 7.5-325 MG PO TABS: 1.0000 | ORAL_TABLET | Freq: Three times a day (TID) | ORAL | Status: DC

## 2017-01-12 MED ORDER — ALUM & MAG HYDROXIDE-SIMETH 200-200-20 MG/5ML PO SUSP
30.0000 mL | ORAL | Status: DC | PRN
  Administered 2017-01-13: 30 mL via ORAL
  Filled 2017-01-12: qty 30

## 2017-01-12 MED ORDER — TRAZODONE HCL 100 MG PO TABS
100.0000 mg | ORAL_TABLET | Freq: Every evening | ORAL | Status: DC | PRN
  Administered 2017-01-12 – 2017-01-14 (×2): 100 mg via ORAL
  Filled 2017-01-12 (×2): qty 1

## 2017-01-12 MED ORDER — MAGNESIUM HYDROXIDE 400 MG/5ML PO SUSP: 30.0000 mL | Freq: Every day | ORAL | Status: DC | PRN

## 2017-01-12 MED ORDER — HYDROCODONE-ACETAMINOPHEN 7.5-325 MG PO TABS
1.0000 | ORAL_TABLET | Freq: Three times a day (TID) | ORAL | Status: DC
  Administered 2017-01-12: 1 via ORAL
  Filled 2017-01-12: qty 1

## 2017-01-12 MED ORDER — ESCITALOPRAM OXALATE 10 MG PO TABS
10.0000 mg | ORAL_TABLET | Freq: Every day | ORAL | Status: DC
  Administered 2017-01-13: 10 mg via ORAL
  Filled 2017-01-12: qty 1

## 2017-01-12 MED ORDER — CLONAZEPAM 0.5 MG PO TABS
0.5000 mg | ORAL_TABLET | Freq: Three times a day (TID) | ORAL | Status: DC | PRN
  Administered 2017-01-12: 0.5 mg via ORAL
  Filled 2017-01-12: qty 1

## 2017-01-12 MED ORDER — NICOTINE 21 MG/24HR TD PT24
21.0000 mg | MEDICATED_PATCH | Freq: Every day | TRANSDERMAL | Status: DC
  Administered 2017-01-13 – 2017-01-15 (×3): 21 mg via TRANSDERMAL
  Filled 2017-01-12 (×3): qty 1

## 2017-01-12 MED ORDER — ASPIRIN EC 81 MG PO TBEC
81.0000 mg | DELAYED_RELEASE_TABLET | Freq: Every day | ORAL | Status: DC
  Administered 2017-01-13 – 2017-01-15 (×3): 81 mg via ORAL
  Filled 2017-01-12 (×3): qty 1

## 2017-01-12 MED ORDER — HALOPERIDOL 0.5 MG PO TABS
0.5000 mg | ORAL_TABLET | Freq: Four times a day (QID) | ORAL | Status: DC
  Administered 2017-01-12 – 2017-01-14 (×8): 0.5 mg via ORAL
  Filled 2017-01-12 (×9): qty 1
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
## 2017-01-12 NOTE — Progress Notes (Signed)
[facility]

## 2017-01-12 NOTE — BH Assessment (Signed)
[facility] Dr. Ardyth HarpsHernandez.  Attending Physician will be Dr. Ardyth HarpsHernandez.   [facility].   Pt Rn Tobi Bastosnna informed of admission and states orders have already been entered by pt's physician (Dr.Vachhani). [name], pt access informed of pt admission for pre-admit.

## 2017-01-12 NOTE — Discharge Instructions (Signed)
[facility]

## 2017-01-12 NOTE — Progress Notes (Signed)
I have seen the pt 2 times since mornign and reviewed her chart and labs.  She is here for drug overdose and some reaction. Her reaction resolved.  She is agitated and non compliant and combative with medical staff.  Medically she is stable , on IVC. I spoke to Psychiatrist and she had seen her.  She can be transferred to behavioral medicine floor whenever the bed is available.  Full note to follow.

## 2017-01-12 NOTE — BH Assessment (Signed)
Dr.Hernandez aware of psych consult order.

## 2017-01-12 NOTE — Progress Notes (Signed)
Chaplain visited with pt, but was not able to talk to pt because of the present of other medical team in the Rm. CH will visit pt at PM as needed to provide pastoral care and support.   01/12/17 1100  Clinical Encounter Type  Visited With Patient;Health care provider  Visit Type Initial;Spiritual support;Social support;Code  Referral From Nurse  Consult/Referral To Chaplain  Spiritual Encounters  Spiritual Needs Other (Comment)

## 2017-01-12 NOTE — Progress Notes (Signed)
Patient received on the unit. Affect blunted.  Denies SI/HI/AVH.  Verbalizes that she does not know why she is here and blames her husband for being her.   Body search and skin assessment performed.  No contraband found.  Skin warm and dry to touch.  No broken areas noted.  Patient oriented to unit and room.

## 2017-01-12 NOTE — [facility] Suicide Risk Assessment (Signed)
[facility]   Nursing information obtained from:  Patient Demographic factors:  NA Current Mental Status:  NA Loss Factors:  NA Historical Factors:  NA Risk Reduction Factors:  Sense of responsibility to family, Living with another person, especially a relative  Total Time spent with patient: 1 hour Principal Problem: Severe recurrent major depression without psychotic features (HCC) Diagnosis:   Patient Active Problem List   Diagnosis Date Noted  . Tobacco use disorder [F17.200] 01/12/2017  . MDD (major depressive disorder) [F32.9] 01/12/2017  . HTN (hypertension) [I10] 01/11/2017  . Severe recurrent major depression without psychotic features (HCC) [F33.2] 12/29/2015  . DDD (degenerative disc disease), cervical [M50.30] 05/25/2015  . 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 DDD (degenerative disc disease), lumbar [M51.36] 05/25/2015  . Facet syndrome, lumbar [facility](HCC) [M46.96] 05/25/2015   Subjective Data:   Continued Clinical Symptoms:  Alcohol Use Disorder Identification Test Final Score (AUDIT): 0 The "Alcohol Use Disorders Identification Test", Guidelines for Use in Primary Care, Second Edition.  World Science writerHealth [employer] [facility](WHO). Score between 0-7:  no or low risk or alcohol related problems. Score between 8-15:  moderate risk of alcohol related problems. Score between 16-19:  high risk of alcohol related problems. Score 20 or above:  warrants further diagnostic evaluation for alcohol dependence and treatment.   CLINICAL FACTORS:   Severe Anxiety and/or Agitation Depression:   Impulsivity Alcohol/Substance Abuse/Dependencies Previous Psychiatric Diagnoses and Treatments    Psychiatric Specialty Exam: Physical Exam  ROS  Blood pressure 136/68, pulse 84, temperature 97.9 F (36.6 C), temperature source Oral, resp. rate 18, height 5\' 6"  (1.676 m), weight 62.1 kg (137 lb), SpO2 100 %.Body mass index is 22.11 kg/m.                                                     Sleep:  Number of Hours: 7      COGNITIVE FEATURES THAT CONTRIBUTE TO RISK:  Polarized thinking    SUICIDE RISK:   Moderate:  Frequent suicidal ideation with limited intensity, and duration, some specificity in terms of plans, no associated intent, good self-control, limited dysphoria/symptomatology, some risk factors present, and identifiable protective factors, including available and accessible social support.  PLAN OF CARE: admit  I certify that inpatient services furnished can reasonably be expected to improve the patient's condition.   Jimmy FootmanHernandez-Gonzalez,  Revis Whalin, MD 01/13/2017, 9:39 AM

## 2017-01-12 NOTE — Tx Team (Signed)
Initial Treatment Plan 01/12/2017 7:50 PM Cecil CrankerDonna M Dall [id number]    PATIENT STRESSORS: Marital or family conflict   PATIENT STRENGTHS: Ability for insight Average or above average intelligence Communication skills   PATIENT IDENTIFIED PROBLEMS: "medication management"  Improved mood                   DISCHARGE CRITERIA:  Improved stabilization in mood, thinking, and/or behavior  PRELIMINARY DISCHARGE PLAN: Return to previous living arrangement  PATIENT/FAMILY INVOLVEMENT: This treatment plan has been presented to and reviewed with the patient, Adron BeneDonna M Lieder, and/or family member, .  The patient and family have been given the opportunity to ask questions and make suggestions.  [name], Helder Crisafulli B, RN 01/12/2017, 7:50 PM

## 2017-01-12 NOTE — Plan of Care (Signed)
Problem: Coping: Goal: Ability to cope will improve Outcome: Progressing Katrina LeashDonna denied depression and voiced no symptoms on shift.

## 2017-01-13 DIAGNOSIS — F332 Major depressive disorder, recurrent severe without psychotic features: Principal | ICD-10-CM

## 2017-01-13 LAB — LIPID PANEL
CHOLESTEROL: 237 mg/dL — AB (ref 0–200)
HDL: 52 mg/dL (ref >40)
LDL CALC: 131 mg/dL — AB (ref 0–99)
Total CHOL/HDL Ratio: 4.6 RATIO
Triglycerides: 269 mg/dL — ABNORMAL HIGH (ref <150)
VLDL: 54 mg/dL — AB (ref 0–40)

## 2017-01-13 LAB — VITAMIN B12: Vitamin B-12: 244 pg/mL (ref 180–914)

## 2017-01-13 LAB — TSH: TSH: 3.54 u[IU]/mL (ref 0.350–4.500)

## 2017-01-13 LAB — HIV ANTIBODY (ROUTINE TESTING W REFLEX): HIV SCREEN 4TH GENERATION: NONREACTIVE

## 2017-01-13 MED ORDER — HYDROCODONE-ACETAMINOPHEN 5-325 MG PO TABS
1.0000 | ORAL_TABLET | Freq: Three times a day (TID) | ORAL | Status: DC
  Administered 2017-01-13 – 2017-01-14 (×3): 1 via ORAL
  Filled 2017-01-13 (×3): qty 1

## 2017-01-13 MED ORDER — ESCITALOPRAM OXALATE 10 MG PO TABS
20.0000 mg | ORAL_TABLET | Freq: Every day | ORAL | Status: DC
  Administered 2017-01-14 – 2017-01-15 (×2): 20 mg via ORAL
  Filled 2017-01-13 (×2): qty 2

## 2017-01-13 NOTE — Progress Notes (Signed)
Pt was pleasant, no aggressive behaviors noted. Pt interacted with peers and staff appropriately. Pt is able to verbalize needs. Pt is on 1:1, sitter at bedside. Nursing hourly checks continue. Will continue to monitor.

## 2017-01-13 NOTE — Progress Notes (Signed)
Pt resting in bed quietly with eyes closed. No distress noted. Continues 1:1, sitter at bedside. Will continue to monitor.

## 2017-01-13 NOTE — Progress Notes (Signed)
Pt denies SI, HI, a/v hallucinations. She reports feeling depressed but commits to safety. She can be irritable at times but no major aggressive behaviors noted. Will continue to monitor for safety.

## 2017-01-13 NOTE — Progress Notes (Signed)
Pt continues to rest quietly. No distress noted. 1:1 continues, sitter at bedside. Will continue to monitor. 

## 2017-01-13 NOTE — Progress Notes (Signed)
Pt resting in bed quietly, with eyes closed. No distress noted. Continue 1:1, sitter at bedside. Hourly nursing rounds continue. Will continue to monitor. 

## 2017-01-13 NOTE — [facility] Group Notes (Signed)
[facility] Group Notes:  (Nursing/MHT/Case Management/Adjunct)  Date:  01/13/2017  Time:  7:09 AM  Type of Therapy:  Psychoeducational Skills  Participation Level:  Did Not Attend  Summary of Progress/Problems:  Katrina MilroyLaquanda Y Vaudie Holmes 01/13/2017, 7:09 AM

## 2017-01-13 NOTE — Progress Notes (Signed)
Pt continues to rest quietly. No distress noted. 1:1 continues, sitter at bedside. Will continue to monitor.

## 2017-01-13 NOTE — Progress Notes (Signed)
Pt resting quietly with eyes closed. No distress noted. Continues 1:1, sitter at bedside. Nursing hourly rounds continue. Will continue to monitor

## 2017-01-13 NOTE — Progress Notes (Signed)
Pt has been taken off 1:1. Pt resting quietly with eyes closed, no distress noted. Will continue to monitor.

## 2017-01-13 NOTE — [facility] Group Notes (Signed)
[facility] LCSW Group Therapy 01/13/2017  1:00pm Mood: Good  Group Topic: Cognitive Distortions Pt response: Pt participated in group appropriately, shares his challenges with family relationships.  Able to identify some distortions in patterns of thinking and how these patterns of thinking could be unhelpful or disruptive to wellness. Pt monopoolizing group at times, required re-direction to allow others to speak  Glennon MacSara P Ethin Drummond, LCSW 01/13/2017, 4:04 PM

## 2017-01-13 NOTE — Plan of Care (Signed)
Problem: Coping: Goal: Ability to verbalize feelings will improve Outcome: Progressing Pt verbalizes feelings to RN and staff

## 2017-01-13 NOTE — Progress Notes (Signed)
Pt resting in bed, with eyes closed resting quietly. No distress noted. Sitter at bedside. Pt continue on 1:1 due to being a high fall risk. Will continue to monitor.

## 2017-01-13 NOTE — Progress Notes (Signed)
Pt continues to rest quietly. No distress noted. 1:1 continues, sitter at bedside. Continue nursing hourly rounds. Will continue to monitor

## 2017-01-13 NOTE — Progress Notes (Signed)
Pt in room with sitter at bedside. No aggressive behaviors noted.

## 2017-01-13 NOTE — Progress Notes (Signed)
Pt is resting in room, with eyes closed, no distress noted. Sitter at bedside. Will continue to monitor.

## 2017-01-13 NOTE — Progress Notes (Signed)
Pt resting in bed quietly, with eyes closed. No distress noted. Continue 1:1, sitter at bedside. Hourly nursing rounds continue. Will continue to monitor.

## 2017-01-14 LAB — HEMOGLOBIN A1C
Hgb A1c MFr Bld: 5.5 % (ref 4.8–5.6)
Mean Plasma Glucose: 111 mg/dL

## 2017-01-14 MED ORDER — HYDROCODONE-ACETAMINOPHEN 5-325 MG PO TABS
1.0000 | ORAL_TABLET | Freq: Two times a day (BID) | ORAL | Status: DC
  Administered 2017-01-15: 1 via ORAL
  Filled 2017-01-14: qty 1

## 2017-01-14 MED ORDER — HYDROCODONE-ACETAMINOPHEN 5-325 MG PO TABS
1.0000 | ORAL_TABLET | Freq: Three times a day (TID) | ORAL | Status: AC
  Administered 2017-01-14: 1 via ORAL
  Filled 2017-01-14: qty 1

## 2017-01-14 NOTE — [facility] Suicide Risk Assessment (Signed)
[facility] INPATIENT:  Family/Significant Other Suicide Prevention Education  Suicide Prevention Education:  Contact Attempts: Katrina LundRobert Holmes, Husband [phone], (name of family member/significant other) has been identified by the patient as the family member/significant other with whom the patient will be residing, and identified as the person(s) who will aid the patient in the event of a mental health crisis.  With written consent from the patient, two attempts were made to provide suicide prevention education, prior to and/or following the patient's discharge.  We were unsuccessful in providing suicide prevention education.  A suicide education pamphlet was given to the patient to share with family/significant other.  Date and time of first attempt:01/14/17, 10:55am Date and time of second attempt:  Katrina MacSara P Rikki Smestad, LCSW 01/14/2017, 10:54 AM

## 2017-01-14 NOTE — Plan of Care (Signed)
Problem: Education: Goal: Emotional status will improve Outcome: Progressing Depressed but contracts for safety Goal: Mental status will improve Outcome: Not Progressing Just admitted

## 2017-01-14 NOTE — Plan of Care (Signed)
Problem: Safety: Goal: Ability to remain free from injury will improve Outcome: Progressing Safety maintained. Safety checks maintained.

## 2017-01-14 NOTE — Progress Notes (Signed)
Pt visible on the periphery of the unit.  Upon approach states she is doing okay.  Contracts for safety on the unit.  C/o generalized achiness but reports relief from her previous Vicodin on the previous shift.  Rested in bed early for the night.  Maintained safety while monitored on 15 minute safety checks.
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
## 2017-01-14 NOTE — [facility] Group Notes (Signed)
[facility] Group Notes:  (Nursing/MHT/Case Management/Adjunct)  Date:  01/14/2017  Time:  7:07 AM  Type of Therapy:  Psychoeducational Skills  Participation Level:  Active  Participation Quality:  Appropriate and Attentive  Affect:  Appropriate  Cognitive:  Appropriate  Insight:  Appropriate and Good  Engagement in Group:  Engaged  Modes of Intervention:  Discussion, Socialization and Support  Summary of Progress/Problems:  Katrina MilroyLaquanda Y Dmitriy Holmes 01/14/2017, 7:07 AM
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
## 2017-01-14 NOTE — [facility] Group Notes (Signed)
  [facility] LCSW Group Therapy  01/14/2017  1:00pm Group Topic:Thoughts and Feelings Therapeutic Goals: Utilize CBT and Affirmation cards to challenge cognitive distortions.  Pt's will be able to identify types of distortions and the feelings that they create for them.  Also demonstrate understanding of Affirmations and how they can be used to encourage more positive thinking style. Methods utilized: CBT, Affirmation cards Patient response: Pt participated actively, able to meet therapeutic goals listed above   Glennon MacSara P Deeana Atwater, LCSW 01/14/2017, 4:04 PM

## 2017-01-14 NOTE — Plan of Care (Signed)
Problem: Education: Goal: Emotional status will improve Outcome: Not Progressing Pt was just admitted, still depressed Goal: Verbalization of understanding the information provided will improve Outcome: Progressing Aware of medications

## 2017-01-14 NOTE — Progress Notes (Signed)
Affect bright, smiles easily.  Denies SI/HI/AVH.  Verbalizes that she is doing okay and verbally contracts for safety.   Further states that she is ready to go home.  No group attendance today.  Isolates to room except for meals and medication pass.  Support offered.  Safety maintained.

## 2017-01-15 DIAGNOSIS — F1320 Sedative, hypnotic or anxiolytic dependence, uncomplicated: Secondary | ICD-10-CM

## 2017-01-15 DIAGNOSIS — F1120 Opioid dependence, uncomplicated: Secondary | ICD-10-CM

## 2017-01-15 MED ORDER — ESCITALOPRAM OXALATE 20 MG PO TABS: 20.0000 mg | ORAL_TABLET | Freq: Every day | ORAL | 0 refills | Status: DC

## 2017-01-15 MED ORDER — TRAZODONE HCL 100 MG PO TABS: 100.0000 mg | ORAL_TABLET | Freq: Every evening | ORAL | 0 refills | Status: DC | PRN

## 2017-01-15 NOTE — Discharge Summary (Signed)
[facility] [place] [facility]   [name]    MR#:  [id number]  DATE OF BIRTH:  28-Apr-1956  DATE OF ADMISSION:  01/11/2017 ADMITTING PHYSICIAN: Oralia Manisavid Willis, MD  DATE OF DISCHARGE: 01/12/2017  4:12 PM  PRIMARY CARE PHYSICIAN: Tiana Loftabellon, Melissa, MD    ADMISSION DIAGNOSIS:  Anaphylaxis, initial encounter [T78.2XXA] Hypotension, unspecified hypotension type [I95.9]  DISCHARGE DIAGNOSIS:  Active Problems:   DDD (degenerative disc disease), cervical   DDD (degenerative disc disease), lumbar   Severe recurrent major depression without psychotic features (HCC)   HTN (hypertension)   Tobacco use disorder   SECONDARY DIAGNOSIS:   Past Medical History:  Diagnosis Date  . Allergy   . Anxiety   . Depression   . Hypertension   . IBS (irritable bowel syndrome) 15 years  . Suicide attempt [facility](HCC)     HOSPITAL COURSE:   *  Anaphylaxis/ durg rashes - symptoms resolved with treatment given in the ED,  kept for observation on medical floor to ensure there is no recurrence. She stayed stable. *  Alcohol intoxication - CIWA protocol with when necessary Ativan per protocol    She did not have shaking/ hallucinations- no signs of withdrawal.  *  Severe recurrent major depression without psychotic features (H[facility]) - unclear story whether the patient, medications in excess or not. She denies suicidal intent. Psychiatric consult     She had multiple agitations episodes next day and non co-operative and hostile towards care staff, psych saw her [facility].  *  Rash - seems to resolve, continue to monitor *  HTN (hypertension) - continue home meds  DISCHARGE CONDITIONS:   Medically stable.  CONSULTS OBTAINED:    DRUG ALLERGIES:   Allergies  Allergen Reactions  . Penicillins Rash    .Has patient had a PCN reaction causing immediate rash, facial/tongue/throat swelling, SOB or lightheadedness with hypotension:  Unknown Has patient had a PCN reaction causing severe rash involving mucus membranes or skin necrosis: Unknown Has patient had a PCN reaction that required hospitalization: Unknown Has patient had a PCN reaction occurring within the last 10 years: Unknown If all of the above answers are "NO", then may proceed with Cephalosporin use.     DISCHARGE MEDICATIONS:   Discharge Medication List as of 01/12/2017  4:14 PM    START taking these medications   Details  haloperidol (HALDOL) 0.5 MG tablet Take 1 tablet (0.5 mg total) by mouth 4 (four) times daily., Starting Fri 01/12/2017, Normal      CONTINUE these medications which have NOT CHANGED   Details  aspirin EC 81 MG tablet Take 81 mg by mouth daily., Until Discontinued, Historical Med    escitalopram (LEXAPRO) 10 MG tablet Take 10 mg by mouth daily., Starting Thu 10/05/2016, Historical Med    HYDROcodone-acetaminophen (NORCO) 7.5-325 MG tablet Take 1 tablet by mouth every 6 (six) hours as needed for pain., Starting Mon 12/25/2016, Historical Med    loperamide (IMODIUM A-D) 2 MG tablet Take 1 tablet (2 mg total) by mouth 4 (four) times daily as needed for diarrhea or loose stools., Starting Sat 02/12/2016, Print    loratadine (CLARITIN) 10 MG tablet Take 10 mg by mouth daily., Until Discontinued, Historical Med    metoprolol succinate (TOPROL XL) 50 MG 24 hr tablet Take 50 mg by mouth daily. Take with or immediately following a meal., Until Discontinued, Historical Med    Multiple Vitamin (MULTI-VITAMINS) TABS Take  1 tablet by mouth daily., Until Discontinued, Historical Med    omeprazole (PRILOSEC) 20 MG capsule Take 20 mg by mouth 2 (two) times daily as needed (for acid reflux)., Until Discontinued, Historical Med    ondansetron (ZOFRAN) 4 MG tablet Take 1 tablet (4 mg total) by mouth every 8 (eight) hours as needed for nausea or vomiting., Starting Sat 02/12/2016, Until Sun 02/11/2017, Print      STOP taking these medications      clonazePAM (KLONOPIN) 0.5 MG tablet      risperiDONE (RISPERDAL) 1 MG tablet          DISCHARGE INSTRUCTIONS:    Follow with psychiatrist as advised.  If you experience worsening of your admission symptoms, develop shortness of breath, life threatening emergency, suicidal or homicidal thoughts you must seek medical attention immediately by calling 911 or calling your MD immediately  if symptoms less severe.  You Must read complete instructions/literature along with all the possible adverse reactions/side effects for all the Medicines you take and that have been prescribed to you. Take any new Medicines after you have completely understood and accept all the possible adverse reactions/side effects.   Please note  You were cared for by a hospitalist during your hospital stay. If you have any questions about your discharge medications or the care you received while you were in the hospital after you are discharged, you can call the unit and asked to speak with the hospitalist on call if the hospitalist that took care of you is not available. Once you are discharged, your primary care physician will handle any further medical issues. Please note that NO REFILLS for any discharge medications will be authorized once you are discharged, as it is imperative that you return to your primary care physician (or establish a relationship with a primary care physician if you do not have one) for your aftercare needs so that they can reassess your need for medications and monitor your lab values.    Today   CHIEF COMPLAINT:   Chief Complaint  Patient presents with  . IVC  . Medical Clearance    HISTORY OF PRESENT ILLNESS:  Katrina Holmes  is a 61 y.o. female presents with Reported. Allergic reaction. Patient states that she started developing a rash and difficulty breathing. She is a poor historian and unable to relate her history very clearly. She states that she was handling some peanut butter, or  that she may have had a reaction to some lidocaine cream. Family reported. Her husband had called her son At [employer] Earlier Today Stating That She Was "Going Crazy". They reported that she took for all of her pills" and then drank wine. The patient received Benadryl, Solu-Medrol, Pepcid, and her allergic symptoms resolved. However, she has had some uncharacteristic behavior here. Alcohol level is 94, consistent with alcohol consumption. However, her clinical status is not consistent with having taken a large amount of her home meds, which include antidepressants, benzodiazepines, pain medications. Psychiatry recommended IVC for further consult, hospitalists were called for medical admission.  VITAL SIGNS:  Blood pressure 134/71, pulse 87, temperature 98.2 F (36.8 C), resp. rate 18, height 5\' 6"  (1.676 m), weight 64.2 kg (141 lb 8 oz), SpO2 97 %.  I/O:  No intake or output data in the 24 hours ending 01/15/17 0751  PHYSICAL EXAMINATION:  GENERAL:  61 y.o.-year-old patient lying in the bed with no acute distress.  EYES: Pupils equal, round, reactive to light and accommodation. No scleral  icterus. Extraocular muscles intact.  HEENT: Head atraumatic, normocephalic. Oropharynx and nasopharynx clear.  NECK:  Supple, no jugular venous distention. No thyroid enlargement, no tenderness.  LUNGS: Normal breath sounds bilaterally, no wheezing, rales,rhonchi or crepitation. No use of accessory muscles of respiration.  CARDIOVASCULAR: S1, S2 normal. No murmurs, rubs, or gallops.  ABDOMEN: Soft, non-tender, non-distended. Bowel sounds present. No organomegaly or mass.  EXTREMITIES: No pedal edema, cyanosis, or clubbing.  NEUROLOGIC: Cranial nerves II through XII are intact. Muscle strength 5/5 in all extremities. Sensation intact. Gait not checked.  PSYCHIATRIC: The patient is alert and oriented x 3. But agitated.  SKIN: No obvious rash, lesion, or ulcer.   DATA REVIEW:   CBC  Recent Labs Lab  01/12/17 0510  WBC 11.9*  HGB 12.5  HCT 37.2  PLT 358    Chemistries   Recent Labs Lab 01/11/17 1702 01/12/17 0510  NA 139 140  K 3.3* 4.5  CL 105 110  CO2 21* 24  GLUCOSE 97 121*  BUN 9 13  CREATININE 0.95 0.64  CALCIUM 9.4 8.8*  AST 22  --   ALT 17  --   ALKPHOS 77  --   BILITOT 0.8  --     Cardiac Enzymes  Recent Labs Lab 01/11/17 1702  TROPONINI <0.03    Microbiology Results  Results for orders placed or performed during the hospital encounter of 10/03/14  GC/Chlamydia Probe Amp     Status: None   Collection Time: 10/03/14  3:15 PM  Result Value Ref Range Status   Micro Text Report   Final       SOURCE: VAGINAL SWAB    CHLAMYDIA                 CHLAMYDIA TRACHOMATIS NEGATIVE   N.GONORRHOEAE             N.GONORRHOEAE NEGATIVE   ANTIBIOTIC                                                      Wet prep, genital     Status: None   Collection Time: 10/03/14  3:15 PM  Result Value Ref Range Status   Micro Text Report   Final       COMMENT                   FEW WHITE BLOOD CELLS SEEN   COMMENT                   NO TRICHOMONAS,SPERMATOZOA,YEAST,OR CLUE CELLS SEEN   ANTIBIOTIC                                                        RADIOLOGY:  No results found.  EKG:   Orders placed or performed during the hospital encounter of 01/11/17  . EKG 12-Lead  . EKG 12-Lead  . ED EKG  . ED EKG      Management plans discussed with the patient, family and they are in agreement.  CODE STATUS:  Code Status History    Date Active Date Inactive Code Status Order ID Comments User Context   01/11/2017 11:33  PM 01/12/2017  4:31 PM Full Code [id number]  Oralia ManisWillis, David, MD Inpatient      TOTAL TIME TAKING CARE OF THIS PATIENT: 75 minutes.    Altamese DillingVACHHANI, Shyonna Carlin M.D on 01/15/2017 at 7:51 AM  Between 7am to 6pm - Pager - [phone]  After 6pm go to [email or web address] - password [email or web address]  [facility]  Office  [phone]  CC: Primary  care physician; Tiana Loftabellon, Melissa, MD   Note: This dictation was prepared with Dragon dictation along with smaller phrase technology. Any transcriptional errors that result from this process are unintentional.

## 2017-01-15 NOTE — Plan of Care (Signed)
Problem: Education: Goal: Knowledge of [facility] Education information/materials will improve Outcome: Progressing Aware of information about illness Goal: Emotional status will improve Outcome: Progressing Improved mood displayed Goal: Mental status will improve Outcome: Progressing Improved mood and thought process Goal: Verbalization of understanding the information provided will improve Outcome: Progressing Understands info provided

## 2017-01-15 NOTE — [facility] Suicide Risk Assessment (Signed)
[facility]   Principal Problem: Severe recurrent major depression without psychotic features [facility](HCC) Discharge Diagnoses:  Patient Active Problem List   Diagnosis Date Noted  . Opioid use disorder, moderate, dependence (HCC) [F11.20] 01/15/2017  . Sedative, hypnotic or anxiolytic use disorder, severe, dependence (HCC) [F13.20] 01/15/2017  . Tobacco use disorder [F17.200] 01/12/2017  . HTN (hypertension) [I10] 01/11/2017  . Severe recurrent major depression without psychotic features (HCC) [F33.2] 12/29/2015  . DDD (degenerative disc disease), cervical [M50.30] 05/25/2015  . 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 DDD (degenerative disc disease), lumbar [M51.36] 05/25/2015     Psychiatric Specialty Exam: ROS  Blood pressure 138/82, pulse 77, temperature 98 F (36.7 C), temperature source Oral, resp. rate 18, height 5\' 6"  (1.676 m), weight 62.1 kg (137 lb), SpO2 100 %.Body mass index is 22.11 kg/m.                                                       Mental Status Per Nursing Assessment::   On Admission:  NA  Demographic Factors:  Caucasian  Loss Factors: Decline in physical health  Historical Factors: Anniversary of important loss and Impulsivity  Risk Reduction Factors:   Sense of responsibility to family, Living with another person, especially a relative and Positive social support  No access to guns  Continued Clinical Symptoms:  Depression:   Impulsivity Alcohol/Substance Abuse/Dependencies Chronic Pain Previous Psychiatric Diagnoses and Treatments  Cognitive Features That Contribute To Risk:  Closed-mindedness    Suicide Risk:  Minimal: No identifiable suicidal ideation.  Patients presenting with no risk factors but with morbid ruminations; may be classified as minimal risk based on the severity of the depressive symptoms  Follow-up Information    [facility] Go on [facility]/16/2017.   Why:  Please  attend your appointment with Dr. Malvin JohnsPotter on Tuesday, 01/16/17, at 2:00pm.  Please bring a copy of your hospital discharge paperwork. Contact information: [address] [place] [address] [phone]        Ewing Schleinrisp, Gregory, MD. Schedule an appointment as soon as possible for a visit.   Specialty:  Pain Medicine Why:  Pain management Contact information: [address] [place] [id number] [phone]           Jimmy FootmanHernandez-Gonzalez,  Jalaiya Oyster, MD 01/15/2017, 9:48 AM

## 2017-01-15 NOTE — [facility] Suicide Risk Assessment (Signed)
[facility] INPATIENT:  Family/Significant Other Suicide Prevention Education  Suicide Prevention Education:  Contact Attempts: Marylu LundRobert Vanwey, husband, [phone], has been identified by the patient as the family member/significant other with whom the patient will be residing, and identified as the person(s) who will aid the patient in the event of a mental health crisis.  With written consent from the patient, two attempts were made to provide suicide prevention education, prior to and/or following the patient's discharge.  We were unsuccessful in providing suicide prevention education.  A suicide education pamphlet was given to the patient to share with family/significant other.  Date and time of first attempt: Date and time of second attempt:01/15/17, 0942  Lorri FrederickWierda, Euna Armon Jon, LCSW 01/15/2017, 9:42 AM

## 2017-01-15 NOTE — B[facility] Group Notes (Signed)
[facility] LCSW Group Therapy   01/15/2017 9:30 am Type of Therapy: Group Therapy   Participation Level: Active   Participation Quality: Attentive, Sharing and Supportive   Affect: Appropriate   Cognitive: Alert and Oriented   Insight: Developing/Improving and Engaged   Engagement in Therapy: Developing/Improving and Engaged   Modes of Intervention: Clarification, Confrontation, Discussion, Education, Exploration,  Limit-setting, Orientation, Problem-solving, Rapport Building, Dance movement psychotherapisteality Testing, Socialization and Support   Summary of Progress/Problems: Pt identified obstacles faced currently and processed barriers involved in overcoming these obstacles. Pt identified steps necessary for overcoming these obstacles and explored motivation (internal and external) for facing these difficulties head on. Pt further identified one area of concern in their lives and chose a goal to focus on for today. Patient identified obstacles that she faces and that led to hospitalization. She identified coping mechanisms that would allow her to combat obstacles and progress in recovery.  Katrina AbbotKadijah John Vasconcelos, MSW, LCSW-A 01/15/2017, 11:46AM

## 2017-01-15 NOTE — Progress Notes (Signed)
Recreation Therapy Notes  Date: 07.30.18 Time: 1:00 pm Location: Craft Room  Group Topic: Wellness  Goal Area(s) Addresses:  Patient will identify at least one item per dimension of health. Patient will examine areas they are deficient in.  Behavioral Response: Did not attend  Intervention: 6 Dimensions of Health  Activity: Patients were given a definition sheet defining each dimension of health. Patients were given a worksheet with each dimension listed and were instructed to write at least one item they were currently doing in each dimension.  Education: LRT educated patient on ways to improve each dimension.  Education Outcome: Patient did not attend group.  Clinical Observations/Feedback: Patient did not attend group.  Jacquelynn CreeGreene,Raeya Merritts M, LRT/CTRS 01/15/2017 1:37 PM

## 2017-01-15 NOTE — Progress Notes (Signed)
Pt isolative and spends time away from her peers. Upon approach states she has difficulty sleeping here and is glad she is being d/c'd tomorrow and feels ready for this.  Denies s/i, h/i or hallucinations.  Flat affect but pleasant upon approach.  Verbalizes needs but is quiet.  Does talk with prompts.  Mood improved subjectively.  Monitored on 15 minute safety checks and maintained safety.

## 2017-01-15 NOTE — Progress Notes (Signed)
Patient up ad lib with steady gait, A&O x 4 with steady gait. Mood is pleasant and cooperative, no aggression nor anger displayed. Denies current SI/Hi, AVH. Compliant with meds and meals. Discharged home today to [facility]. Discharge information reviewed with the patient upon discharge with verbal acknowledgment of understanding. Transported off of campus via POV with husband. No complaints of voiced.
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
## 2017-01-15 NOTE — Tx Team (Signed)
Interdisciplinary Treatment and Diagnostic Plan Update  01/15/2017 Time of Session: [address] Katrina Holmes MRN: [id number]  Principal Diagnosis: Severe recurrent major depression without psychotic features [facility])  Secondary Diagnoses: Principal Problem:   Severe recurrent major depression without psychotic features (HCC) Active Problems:   DDD (degenerative disc disease), cervical   DJD of shoulder   DDD (degenerative disc disease), lumbar   HTN (hypertension)   Tobacco use disorder   Opioid use disorder, moderate, dependence (HCC)   Sedative, hypnotic or anxiolytic use disorder, severe, dependence (HCC)   Current Medications:  Current Facility-Administered Medications  Medication Dose Route Frequency Provider Last Rate Last Dose  . acetaminophen (TYLENOL) tablet 650 mg  650 mg Oral Q6H PRN Jimmy Footman, MD   650 mg at 01/15/17 0043  . alum & mag hydroxide-simeth (MAALOX/MYLANTA) 200-200-20 MG/5ML suspension 30 mL  30 mL Oral Q4H PRN Jimmy Footman, MD   30 mL at 01/13/17 1553  . aspirin EC tablet 81 mg  81 mg Oral Daily Jimmy Footman, MD   81 mg at 01/15/17 1191  . escitalopram (LEXAPRO) tablet 20 mg  20 mg Oral Daily Jimmy Footman, MD   20 mg at 01/15/17 4782  . HYDROcodone-acetaminophen (NORCO/VICODIN) 5-325 MG per tablet 1 tablet  1 tablet Oral BID Jimmy Footman, MD   1 tablet at 01/15/17 [phone]  . magnesium hydroxide (MILK OF MAGNESIA) suspension 30 mL  30 mL Oral Daily PRN Jimmy Footman, MD      . nicotine (NICODERM CQ - dosed in mg/24 hours) patch 21 mg  21 mg Transdermal Daily Jimmy Footman, MD   21 mg at 01/15/17 1308  . pantoprazole (PROTONIX) EC tablet 40 mg  40 mg Oral BID Jimmy Footman, MD   40 mg at 01/15/17 6578  . traZODone (DESYREL) tablet 100 mg  100 mg Oral QHS PRN Jimmy Footman, MD   100 mg at 01/14/17 2316   PTA Medications: Prescriptions Prior to  Admission  Medication Sig Dispense Refill Last Dose  . aspirin EC 81 MG tablet Take 81 mg by mouth daily.   Past Month at Unknown time  . escitalopram (LEXAPRO) 10 MG tablet Take 10 mg by mouth daily.  0 Past Week at Unknown time  . haloperidol (HALDOL) 0.5 MG tablet Take 1 tablet (0.5 mg total) by mouth 4 (four) times daily. 30 tablet 0   . HYDROcodone-acetaminophen (NORCO) 7.5-325 MG tablet Take 1 tablet by mouth every 6 (six) hours as needed for pain.  0 PRN at PRN  . loperamide (IMODIUM A-D) 2 MG tablet Take 1 tablet (2 mg total) by mouth 4 (four) times daily as needed for diarrhea or loose stools. 12 tablet 0 PRN at PRN  . loratadine (CLARITIN) 10 MG tablet Take 10 mg by mouth daily.   PRN at PRN  . metoprolol succinate (TOPROL XL) 50 MG 24 hr tablet Take 50 mg by mouth daily. Take with or immediately following a meal.   Not Taking at Unknown time  . Multiple Vitamin (MULTI-VITAMINS) TABS Take 1 tablet by mouth daily.   Past Week at Unknown time  . omeprazole (PRILOSEC) 20 MG capsule Take 20 mg by mouth 2 (two) times daily as needed (for acid reflux).   Not Taking at Unknown time  . ondansetron (ZOFRAN) 4 MG tablet Take 1 tablet (4 mg total) by mouth every 8 (eight) hours as needed for nausea or vomiting. 20 tablet 0 PRN at PRN    Patient Stressors: Marital or family conflict  Patient Strengths: Ability for insight Average or above average intelligence Communication skills  Treatment Modalities: Medication Management, Group therapy, Case management,  1 to 1 session with clinician, Psychoeducation, Recreational therapy.   Physician Treatment Plan for Primary Diagnosis: Severe recurrent major depression without psychotic features (HCC) Long Term Goal(s): Improvement in symptoms so as ready for discharge Improvement in symptoms so as ready for discharge   Short Term Goals: Ability to demonstrate self-control will improve Ability to identify and develop effective coping behaviors will  improve Ability to identify triggers associated with substance abuse/mental health issues will improve Ability to identify changes in lifestyle to reduce recurrence of condition will improve  Medication Management: Evaluate patient's response, side effects, and tolerance of medication regimen.  Therapeutic Interventions: 1 to 1 sessions, Unit Group sessions and Medication administration.  Evaluation of Outcomes: Adequate for Discharge  Physician Treatment Plan for Secondary Diagnosis: Principal Problem:   Severe recurrent major depression without psychotic features (HCC) Active Problems:   DDD (degenerative disc disease), cervical   DJD of shoulder   DDD (degenerative disc disease), lumbar   HTN (hypertension)   Tobacco use disorder   Opioid use disorder, moderate, dependence (HCC)   Sedative, hypnotic or anxiolytic use disorder, severe, dependence (HCC)  Long Term Goal(s): Improvement in symptoms so as ready for discharge Improvement in symptoms so as ready for discharge   Short Term Goals: Ability to demonstrate self-control will improve Ability to identify and develop effective coping behaviors will improve Ability to identify triggers associated with substance abuse/mental health issues will improve Ability to identify changes in lifestyle to reduce recurrence of condition will improve     Medication Management: Evaluate patient's response, side effects, and tolerance of medication regimen.  Therapeutic Interventions: 1 to 1 sessions, Unit Group sessions and Medication administration.  Evaluation of Outcomes: Adequate for Discharge   RN Treatment Plan for Primary Diagnosis: Severe recurrent major depression without psychotic features (HCC) Long Term Goal(s): Knowledge of disease and therapeutic regimen to maintain health will improve  Short Term Goals: Compliance with prescribed medications will improve  Medication Management: RN will administer medications as ordered by  provider, will assess and evaluate patient's response and provide education to patient for prescribed medication. RN will report any adverse and/or side effects to prescribing provider.  Therapeutic Interventions: 1 on 1 counseling sessions, Psychoeducation, Medication administration, Evaluate responses to treatment, Monitor vital signs and CBGs as ordered, Perform/monitor CIWA, COWS, AIMS and Fall Risk screenings as ordered, Perform wound care treatments as ordered.  Evaluation of Outcomes: Adequate for Discharge   LCSW Treatment Plan for Primary Diagnosis: Severe recurrent major depression without psychotic features (HCC) Long Term Goal(s): Safe transition to appropriate next level of care at discharge, Engage patient in therapeutic group addressing interpersonal concerns.  Short Term Goals: Engage patient in aftercare planning with referrals and resources  Therapeutic Interventions: Assess for all discharge needs, 1 to 1 time with Social worker, Explore available resources and support systems, Assess for adequacy in community support network, Educate family and significant other(s) on suicide prevention, Complete Psychosocial Assessment, Interpersonal group therapy.  Evaluation of Outcomes: Adequate for Discharge   Progress in Treatment: Attending groups: Yes. Participating in groups: Yes. Taking medication as prescribed: Yes. Toleration medication: Yes. Family/Significant other contact made: No, will contact:  contact attempts made Patient understands diagnosis: Yes. Discussing patient identified problems/goals with staff: Yes. Medical problems stabilized or resolved: Yes. Denies suicidal/homicidal ideation: Yes. Issues/concerns per patient self-inventory: No. Other: none  New problem(s) identified:  No, Describe:  none  New Short Term/Long Term Goal(s): Pt discharging today, goal to follow up with current provider.  Discharge Plan or Barriers: [facility], Dr Malvin JohnsPotter.  Reason for Continuation of Hospitalization: Medication stabilization  Estimated Length of Stay:discharge today.  Attendees: Patient: Katrina DashDonna Holmes 01/15/2017   Physician: Dr. Ardyth HarpsHernandez, MD 01/15/2017   Nursing: Leonia ReaderPhyllis Cobb, RN 01/15/2017   RN Care Manager: 01/15/2017   Social Worker: Daleen SquibbGreg Arrietty Dercole, LCSW 01/15/2017   Recreational Therapist: Hershal CoriaBeth Greene, LRT/CTRS  01/15/2017   Other:  01/15/2017   Other:  01/15/2017   Other: 01/15/2017        Scribe for Treatment Team: Lorri FrederickWierda, Becky Colan Jon, LCSW 01/15/2017 11:48 AM

## 2017-01-15 NOTE — Progress Notes (Signed)
  [facility] :  Will you be returning to the same living situation after discharge:  Yes,  with husband At discharge, do you have transportation home?: Yes,  husband Do you have the ability to pay for your medications: Yes,  [employer]  Release of information consent forms completed and in the chart;  Patient's signature needed at discharge.  Patient to Follow up at: Follow-up Information    [facility] Go on 01/16/2017.   Why:  Please attend your appointment with Dr. Malvin JohnsPotter on Tuesday, 01/16/17, at 2:00pm.  Please bring a copy of your hospital discharge paperwork. Contact information: [address] [place] [address] [phone]        Ewing Schleinrisp, Latoiya Maradiaga, MD. Schedule an appointment as soon as possible for a visit.   Specialty:  Pain Medicine Why:  Pain management Contact information: [address] [place] [address] [place] [id number] [phone]        Tiana Loftabellon, Melissa, MD. Schedule an appointment as soon as possible for a visit.   Specialty:  Pediatrics Why:  please f/u with your primary care Contact information: [address] [place] [address] [phone]           Next level of care provider has access to [facility] Link:no  Safety Planning and Suicide Prevention discussed: No. Attempts made to contact husband.  Have you used any form of tobacco in the last 30 days? (Cigarettes, Smokeless Tobacco, Cigars, and/or Pipes): Yes  Has patient been referred to the Quitline?: Patient refused referral  Patient has been referred for addiction treatment: Yes  Lorri FrederickWierda, Tilden Broz Jon, LCSW 01/15/2017, 10:38 AM

## 2017-03-12 ENCOUNTER — Encounter: Payer: Self-pay | Admitting: Emergency Medicine

## 2017-03-12 ENCOUNTER — Emergency Department
Admission: EM | Admit: 2017-03-12 | Discharge: 2017-03-12 | Disposition: A | Discharge status: Home / Self Care | Payer: BLUE CROSS/BLUE SHIELD | Attending: Emergency Medicine | Admitting: Emergency Medicine

## 2017-03-12 DIAGNOSIS — F419 Anxiety disorder, unspecified: Secondary | ICD-10-CM | POA: Insufficient documentation

## 2017-03-12 DIAGNOSIS — F331 Major depressive disorder, recurrent, moderate: Secondary | ICD-10-CM | POA: Insufficient documentation

## 2017-03-12 DIAGNOSIS — I10 Essential (primary) hypertension: Secondary | ICD-10-CM | POA: Diagnosis not present

## 2017-03-12 DIAGNOSIS — F17210 Nicotine dependence, cigarettes, uncomplicated: Secondary | ICD-10-CM | POA: Insufficient documentation

## 2017-03-12 DIAGNOSIS — Z79899 Other long term (current) drug therapy: Secondary | ICD-10-CM | POA: Insufficient documentation

## 2017-03-12 DIAGNOSIS — Z7982 Long term (current) use of aspirin: Secondary | ICD-10-CM | POA: Insufficient documentation

## 2017-03-12 LAB — CBC
HEMATOCRIT: 37.3 % (ref 35.0–47.0)
HEMOGLOBIN: 13 g/dL (ref 12.0–16.0)
MCH: 29.3 pg (ref 26.0–34.0)
MCHC: 34.8 g/dL (ref 32.0–36.0)
MCV: 84.4 fL (ref 80.0–100.0)
Platelets: 328 10*3/uL (ref 150–440)
RBC: 4.42 MIL/uL (ref 3.80–5.20)
RDW: 14.3 % (ref 11.5–14.5)
WBC: 10.8 10*3/uL (ref 3.6–11.0)

## 2017-03-12 LAB — COMPREHENSIVE METABOLIC PANEL
ALT: 21 U/L (ref 14–54)
AST: 24 U/L (ref 15–41)
Albumin: 3.9 g/dL (ref 3.5–5.0)
Alkaline Phosphatase: 74 U/L (ref 38–126)
Anion gap: 8 (ref 5–15)
BILIRUBIN TOTAL: 0.6 mg/dL (ref 0.3–1.2)
BUN: 8 mg/dL (ref 6–20)
CO2: 28 mmol/L (ref 22–32)
CREATININE: 0.67 mg/dL (ref 0.44–1.00)
Calcium: 9.5 mg/dL (ref 8.9–10.3)
Chloride: 105 mmol/L (ref 101–111)
Glucose, Bld: 113 mg/dL — ABNORMAL HIGH (ref 65–99)
POTASSIUM: 3.9 mmol/L (ref 3.5–5.1)
Sodium: 141 mmol/L (ref 135–145)
TOTAL PROTEIN: 7.8 g/dL (ref 6.5–8.1)

## 2017-03-12 LAB — URINE DRUG SCREEN, QUALITATIVE (ARMC ONLY)
AMPHETAMINES, UR SCREEN: NOT DETECTED
Barbiturates, Ur Screen: NOT DETECTED
Benzodiazepine, Ur Scrn: POSITIVE — AB
Cannabinoid 50 Ng, Ur ~~LOC~~: NOT DETECTED
Cocaine Metabolite,Ur ~~LOC~~: NOT DETECTED
MDMA (ECSTASY) UR SCREEN: NOT DETECTED
Methadone Scn, Ur: NOT DETECTED
OPIATE, UR SCREEN: NOT DETECTED
PHENCYCLIDINE (PCP) UR S: NOT DETECTED
Tricyclic, Ur Screen: NOT DETECTED

## 2017-03-12 LAB — ACETAMINOPHEN LEVEL: Acetaminophen (Tylenol), Serum: <10 ug/mL — ABNORMAL LOW (ref 10–30)

## 2017-03-12 LAB — SALICYLATE LEVEL: Salicylate Lvl: <7 mg/dL (ref 2.8–30.0)

## 2017-03-12 LAB — ETHANOL: Alcohol, Ethyl (B): <5 mg/dL (ref <5)

## 2017-03-12 MED ORDER — LORAZEPAM 1 MG PO TABS
1.0000 mg | ORAL_TABLET | Freq: Once | ORAL | Status: AC
  Administered 2017-03-12: 1 mg via ORAL
  Filled 2017-03-12: qty 1

## 2017-03-12 MED ORDER — LORAZEPAM 1 MG PO TABS: 1.0000 mg | ORAL_TABLET | Freq: Two times a day (BID) | ORAL | 0 refills | Status: AC

## 2017-03-12 NOTE — ED Provider Notes (Signed)
[facility] Emergency Department Provider Note  ____________________________________________   First MD Initiated Contact with Patient 03/12/17 1911     (approximate)  I have reviewed the triage vital signs and the nursing notes.   HISTORY  Chief Complaint Depression    HPI Katrina Holmes is a 61 y.o. female who self presents to the emergency department requesting a prescription for alprazolam. She haa long-standing history of depressionan anxiety for which she takes alprazolam 3 times a day. She recently ran out and she attempted to call her psychiatrist. When she got her to the on-call psychiatrist who recommended she come to the emergency department to receive her prescription. She says that she feels anxious and somewhat depressed but is not suicidal. She did not try to hurt herself. She contracts for safety. Symptoms have been gradual onset but became acutely worse earlier today. There are improved with benzodiazepines worsened when she does not have benzos.   Past Medical History:  Diagnosis Date  . Allergy   . Anxiety   . Depression   . Hypertension   . IBS (irritable bowel syndrome) 15 years  . Suicide attempt [facility])     Patient Active Problem List   Diagnosis Date Noted  . Opioid use disorder, moderate, dependence (HCC) 01/15/2017  . Sedative, hypnotic or anxiolytic use disorder, severe, dependence (HCC) 01/15/2017  . Tobacco use disorder 01/12/2017  . HTN (hypertension) 01/11/2017  . Severe recurrent major depression without psychotic features (HCC) 12/29/2015  . DDD (degenerative disc disease), cervical 05/25/2015  . 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 DDD (degenerative disc disease), lumbar 05/25/2015    Past Surgical History:  Procedure Laterality Date  . ABDOMINAL HYSTERECTOMY    . NECK SURGERY  2006  . SHOULDER ARTHROSCOPY WITH ROTATOR CUFF REPAIR Left    3 surgeries (2006-2007-2008)    Prior to Admission medications     Medication Sig Start Date End Date Taking? Authorizing Provider  aspirin EC 81 MG tablet Take 81 mg by mouth daily.    [provider]  escitalopram (LEXAPRO) 20 MG tablet Take 1 tablet (20 mg total) by mouth daily. 01/16/17   Jimmy Footman, MD  loratadine (CLARITIN) 10 MG tablet Take 10 mg by mouth daily.    [provider]  LORazepam (ATIVAN) 1 MG tablet Take 1 tablet (1 mg total) by mouth 2 (two) times daily. 03/12/17 03/12/18  Merrily Brittle, MD  traZODone (DESYREL) 100 MG tablet Take 1 tablet (100 mg total) by mouth at bedtime as needed for sleep. 01/15/17   Jimmy Footman, MD    Allergies Penicillins  Family History  Problem Relation Age of Onset  . Heart disease Mother   . Stroke Father     Social History Social History  Substance Use Topics  . Smoking status: Current Every Day Smoker    Packs/day: 0.50    Types: Cigarettes  . Smokeless tobacco: Never Used  . Alcohol use No    Review of Systems Constitutional: No fever/chills Eyes: No visual changes. ENT: No sore throat. Cardiovascular: positive for chest pain. Respiratory: positive for shortness of breath. Gastrointestinal: No abdominal pain.  No nausea, no vomiting.  No diarrhea.  No constipation. Genitourinary: Negative for dysuria. Musculoskeletal: Negative for back pain. Skin: Negative for rash. Neurological: Negative for headaches, focal weakness or numbness.   ____________________________________________   PHYSICAL EXAM:  VITAL SIGNS: ED Triage Vitals  Enc Vitals Group     BP 03/12/17 1845 129/81  Pulse Rate 03/12/17 1845 87     Resp 03/12/17 1845 16     Temp 03/12/17 1845 98.3 F (36.8 C)     Temp Source 03/12/17 1845 Oral     SpO2 03/12/17 1845 100 %     Weight 03/12/17 1846 139 lb (63 kg)     Height 03/12/17 1846  (1.676 m)     Head Circumference --      Peak Flow --      Pain Score --      Pain Loc --      Pain Edu? --      Excl. in GC?  --     Constitutional: alert and oriented 4 tearful and somewhat anxious appearing. Eyes: PERRL EOMI. Head: Atraumatic. Nose: No congestion/rhinnorhea. Mouth/Throat: No trismus Neck: No stridor.   Cardiovascular: Normal rate, regular rhythm. Grossly normal heart sounds.  Good peripheral circulation. Respiratory: Normal respiratory effort.  No retractions. Lungs CTAB and moving good air Gastrointestinal: soft nontender Musculoskeletal: No lower extremity edema   Neurologic:  Normal speech and language. No gross focal neurologic deficits are appreciated. Skin:  Skin is warm, dry and intact. No rash noted. Psychiatric: anxious appearing    ____________________________________________   DIFFERENTIAL includes but not limited to  panic attack, anxiety, depression, major depressive disorder ____________________________________________   LABS (all labs ordered are listed, but only abnormal results are displayed)  Labs Reviewed  COMPREHENSIVE METABOLIC PANEL - Abnormal; Notable for the following:       Result Value   Glucose, Bld 113 (*)    All other components within normal limits  ACETAMINOPHEN LEVEL - Abnormal; Notable for the following:    Acetaminophen (Tylenol), Serum <10 (*)    All other components within normal limits  URINE DRUG SCREEN, QUALITATIVE ([facility] ONLY) - Abnormal; Notable for the following:    Benzodiazepine, Ur Scrn POSITIVE (*)    All other components within normal limits  ETHANOL  SALICYLATE LEVEL  CBC    blood work reviewed and interpreted by me is unremarkable aside from positive benzodiazepine __________________________________________  EKG   ____________________________________________  RADIOLOGY   ____________________________________________   PROCEDURES  Procedure(s) performed: no  Procedures  Critical Care performed: no  Observation: no ____________________________________________   INITIAL IMPRESSION / ASSESSMENT AND PLAN / ED  COURSE  Pertinent labs & imaging results that were available during my care of the patient were reviewed by me and considered in my medical decision making (see chart for details).  The patient arrives hemodynamically stable and well somewhat anxious well appearing. She is given a single dose of lorazepam here as her husband is driving her. I explained to patient I was not comfortable prescribing alprazolam but I would be happy to give her a short course of lorazepam instead. She feels significantly improved and is medically stable for outpatient management. Once again she contracts for safety.      ____________________________________________   FINAL CLINICAL IMPRESSION(S) / ED DIAGNOSES  Final diagnoses:  Moderate episode of recurrent major depressive disorder (HCC)  Anxiety      NEW MEDICATIONS STARTED DURING THIS VISIT:  Discharge Medication List as of 03/12/2017  7:47 PM    START taking these medications   Details  LORazepam (ATIVAN) 1 MG tablet Take 1 tablet (1 mg total) by mouth 2 (two) times daily., Starting Mon 03/12/2017, Until Tue 03/12/2018, Print         Note:  This document was prepared using Dragon voice recognition software and may  include unintentional dictation errors.     Merrily Brittle, MD 03/12/17 [phone]

## 2017-03-12 NOTE — ED Notes (Signed)

## 2017-03-12 NOTE — ED Notes (Signed)
Patient requesting her husband, Jakaria Lavergne, have her passcode so he is able to get updates. Patients husband will also take patients purse home with him. Bag of clothing will be kept in [employer]

## 2017-03-12 NOTE — ED Triage Notes (Signed)
Patient presents to ED via POV from home due to depression. Patient denies SI or HI. When asked how long patient has been feeling like this patient states, "for 30 years". Patient also reports, "I'm just tense and depressed". Cooperative in triage.

## 2017-03-12 NOTE — Discharge Instructions (Addendum)
IT IS NOT SAFE TO DRIVE WHEN YOU HAVE TAKEN ATIVAN (LORAZEPAM)  Please keep your appointment to see your psychiatrist on October 17 scheduled. If you're unable to last that long, you can always walk into [facility] for further evaluation. Return to the emergency department for any concerns.  It was a pleasure to take care of you today, and thank you for coming to our emergency department.  If you have any questions or concerns before leaving please ask the nurse to grab me and I'm more than happy to go through your aftercare instructions again.  If you were prescribed any opioid pain medication today such as Norco, Vicodin, Percocet, morphine, hydrocodone, or oxycodone please make sure you do not drive when you are taking this medication as it can alter your ability to drive safely.  If you have any concerns once you are home that you are not improving or are in fact getting worse before you can make it to your follow-up appointment, please do not hesitate to call 911 and come back for further evaluation.  Merrily Brittle, MD  Results for orders placed or performed during the hospital encounter of 03/12/17  Comprehensive metabolic panel  Result Value Ref Range   Sodium 141 135 - 145 mmol/L   Potassium 3.9 3.5 - 5.1 mmol/L   Chloride 105 101 - 111 mmol/L   CO2 28 22 - 32 mmol/L   Glucose, Bld 113 (H) 65 - 99 mg/dL   BUN 8 6 - 20 mg/dL   Creatinine, Ser 1.61 0.44 - 1.00 mg/dL   Calcium 9.5 8.9 - 09.6 mg/dL   Total Protein 7.8 6.5 - 8.1 g/dL   Albumin 3.9 3.5 - 5.0 g/dL   AST 24 15 - 41 U/L   ALT 21 14 - 54 U/L   Alkaline Phosphatase 74 38 - 126 U/L   Total Bilirubin 0.6 0.3 - 1.2 mg/dL   GFR calc non Af Amer >60 >60 mL/min   GFR calc Af Amer >60 >60 mL/min   Anion gap 8 5 - 15  Ethanol  Result Value Ref Range   Alcohol, Ethyl (B) <5 <5 mg/dL  Salicylate level  Result Value Ref Range   Salicylate Lvl <7.0 2.8 - 30.0 mg/dL  Acetaminophen level  Result Value Ref Range   Acetaminophen  (Tylenol), Serum <10 (L) 10 - 30 ug/mL  cbc  Result Value Ref Range   WBC 10.8 3.6 - 11.0 K/uL   RBC 4.42 3.80 - 5.20 MIL/uL   Hemoglobin 13.0 12.0 - 16.0 g/dL   HCT 04.5 40.9 - 81.1 %   MCV 84.4 80.0 - 100.0 fL   MCH 29.3 26.0 - 34.0 pg   MCHC 34.8 32.0 - 36.0 g/dL   RDW 91.4 78.2 - 95.6 %   Platelets 328 150 - 440 K/uL  Urine Drug Screen, Qualitative  Result Value Ref Range   Tricyclic, Ur Screen NONE DETECTED NONE DETECTED   Amphetamines, Ur Screen NONE DETECTED NONE DETECTED   MDMA (Ecstasy)Ur Screen NONE DETECTED NONE DETECTED   Cocaine Metabolite,Ur [place] NONE DETECTED NONE DETECTED   Opiate, Ur Screen NONE DETECTED NONE DETECTED   Phencyclidine (PCP) Ur S NONE DETECTED NONE DETECTED   Cannabinoid 50 Ng, Ur [place] NONE DETECTED NONE DETECTED   Barbiturates, Ur Screen NONE DETECTED NONE DETECTED   Benzodiazepine, Ur Scrn POSITIVE (A) NONE DETECTED   Methadone Scn, Ur NONE DETECTED NONE DETECTED

## 2017-04-03 ENCOUNTER — Emergency Department
Admission: EM | Admit: 2017-04-03 | Discharge: 2017-04-03 | Disposition: A | Discharge status: Home / Self Care | Payer: BLUE CROSS/BLUE SHIELD | Attending: Emergency Medicine | Admitting: Emergency Medicine

## 2017-04-03 ENCOUNTER — Encounter: Payer: Self-pay | Admitting: Emergency Medicine

## 2017-04-03 DIAGNOSIS — G8929 Other chronic pain: Secondary | ICD-10-CM | POA: Insufficient documentation

## 2017-04-03 DIAGNOSIS — I10 Essential (primary) hypertension: Secondary | ICD-10-CM | POA: Insufficient documentation

## 2017-04-03 DIAGNOSIS — Z79899 Other long term (current) drug therapy: Secondary | ICD-10-CM | POA: Insufficient documentation

## 2017-04-03 DIAGNOSIS — M545 Low back pain, unspecified: Secondary | ICD-10-CM

## 2017-04-03 DIAGNOSIS — F17210 Nicotine dependence, cigarettes, uncomplicated: Secondary | ICD-10-CM | POA: Diagnosis not present

## 2017-04-03 DIAGNOSIS — Z7982 Long term (current) use of aspirin: Secondary | ICD-10-CM | POA: Insufficient documentation

## 2017-04-03 MED ORDER — KETOROLAC TROMETHAMINE 60 MG/2ML IM SOLN
60.0000 mg | Freq: Once | INTRAMUSCULAR | Status: AC
  Administered 2017-04-03: 60 mg via INTRAMUSCULAR
  Filled 2017-04-03: qty 2

## 2017-04-03 MED ORDER — HYDROMORPHONE HCL 1 MG/ML IJ SOLN
1.0000 mg | Freq: Once | INTRAMUSCULAR | Status: AC
  Administered 2017-04-03: 1 mg via INTRAMUSCULAR
  Filled 2017-04-03: qty 1

## 2017-04-03 MED ORDER — ORPHENADRINE CITRATE 30 MG/ML IJ SOLN
60.0000 mg | Freq: Two times a day (BID) | INTRAMUSCULAR | Status: DC
  Administered 2017-04-03: 60 mg via INTRAMUSCULAR
  Filled 2017-04-03: qty 2

## 2017-04-03 NOTE — ED Notes (Signed)
Patient ambulating with husbands assistance to bathroom at this time

## 2017-04-03 NOTE — ED Provider Notes (Signed)
[facility] Emergency Department Provider Note   ____________________________________________   First MD Initiated Contact with Patient 04/03/17 [phone]     (approximate)  I have reviewed the triage vital signs and the nursing notes.   HISTORY  Chief Complaint Back Pain    HPI Katrina Holmes is a 61 y.o. female patient complaining of chronic low back pain secondary to arthritis. Patient state she was in pain management but decided take a break from this therapy. Patient state her family doctor prescribed Lyrica but she's with preauthorization to get the medication. Patient has decided to go back into pain management and has appointment next month. Patient rates the pain as a 10 over 10.  Past Medical History:  Diagnosis Date  . Allergy   . Anxiety   . Depression   . Hypertension   . IBS (irritable bowel syndrome) 15 years  . Suicide attempt [facility])     Patient Active Problem List   Diagnosis Date Noted  . Opioid use disorder, moderate, dependence (HCC) 01/15/2017  . Sedative, hypnotic or anxiolytic use disorder, severe, dependence (HCC) 01/15/2017  . Tobacco use disorder 01/12/2017  . HTN (hypertension) 01/11/2017  . Severe recurrent major depression without psychotic features (HCC) 12/29/2015  . DDD (degenerative disc disease), cervical 05/25/2015  . 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 DDD (degenerative disc disease), lumbar 05/25/2015    Past Surgical History:  Procedure Laterality Date  . ABDOMINAL HYSTERECTOMY    . NECK SURGERY  2006  . SHOULDER ARTHROSCOPY WITH ROTATOR CUFF REPAIR Left    3 surgeries (2006-2007-2008)    Prior to Admission medications   Medication Sig Start Date End Date Taking? Authorizing Provider  aspirin EC 81 MG tablet Take 81 mg by mouth daily.    [provider]  escitalopram (LEXAPRO) 20 MG tablet Take 1 tablet (20 mg total) by mouth daily. 01/16/17   Jimmy Footman, MD  loratadine (CLARITIN) 10  MG tablet Take 10 mg by mouth daily.    [provider]  LORazepam (ATIVAN) 1 MG tablet Take 1 tablet (1 mg total) by mouth 2 (two) times daily. 03/12/17 03/12/18  Merrily Brittle, MD  traZODone (DESYREL) 100 MG tablet Take 1 tablet (100 mg total) by mouth at bedtime as needed for sleep. 01/15/17   Jimmy Footman, MD    Allergies Penicillins  Family History  Problem Relation Age of Onset  . Heart disease Mother   . Stroke Father     Social History Social History  Substance Use Topics  . Smoking status: Current Every Day Smoker    Packs/day: 0.50    Types: Cigarettes  . Smokeless tobacco: Never Used  . Alcohol use No    Review of Systems Constitutional: No fever/chills Eyes: No visual changes. ENT: No sore throat. Cardiovascular: Denies chest pain. Respiratory: Denies shortness of breath. Gastrointestinal: No abdominal pain.  No nausea, no vomiting.  No diarrhea.  No constipation. Genitourinary: Negative for dysuria. Musculoskeletal: chronic back pain Skin: Negative for rash. Neurological: Negative for headaches, focal weakness or numbness. Allergic/Immunilogical: penicillin ____________________________________________   PHYSICAL EXAM:  VITAL SIGNS: ED Triage Vitals [04/03/17 0725]  Enc Vitals Group     BP (!) 142/84     Pulse Rate 84     Resp 18     Temp 98 F (36.7 C)     Temp Source Oral     SpO2 100 %     Weight 139 lb (63 kg)  Height      Head Circumference      Peak Flow      Pain Score 8     Pain Loc      Pain Edu?      Excl. in GC?    Constitutional: Alert and oriented. Well appearing and in no acute distress. Neck: No stridor.  cervical spine tenderness to palpation. Hematological/Lymphatic/Immunilogical: No cervical lymphadenopathy. Cardiovascular: Normal rate, regular rhythm. Grossly normal heart sounds.  Good peripheral circulation. Respiratory: Normal respiratory effort.  No retractions. Lungs CTAB. Musculoskeletal: No  lower extremity tenderness nor edema.  No joint effusions. Neurologic:  Normal speech and language. No gross focal neurologic deficits are appreciated. No gait instability. Skin:  Skin is warm, dry and intact. No rash noted. Psychiatric: Mood and affect are normal. Speech and behavior are normal.  ____________________________________________   LABS (all labs ordered are listed, but only abnormal results are displayed)  Labs Reviewed - No data to display ____________________________________________  EKG   ____________________________________________  RADIOLOGY  No results found.  ____________________________________________   PROCEDURES  Procedure(s) performed: None  Procedures  Critical Care performed: No  ____________________________________________   INITIAL IMPRESSION / ASSESSMENT AND PLAN / ED COURSE  As part of my medical decision making, I reviewed the following data within the electronic MEDICAL RECORD NUMBER    Patient present for pain management secondary to chronic back pain. Review patient records show she had an x-ray of the lumbar spine 2017 which showed no acute findings. Patient does has postoperative changes in the cervical spine. Discussed with patient rationale for not providing narcotic pain medication this time. Patient advised to follow-up with PCP until she is accepted back into pain management.      ____________________________________________   FINAL CLINICAL IMPRESSION(S) / ED DIAGNOSES  Final diagnoses:  Chronic bilateral low back pain without sciatica      NEW MEDICATIONS STARTED DURING THIS VISIT:  New Prescriptions   No medications on file     Note:  This document was prepared using Dragon voice recognition software and may include unintentional dictation errors.    Joni Reining, PA-C 04/03/17 1914    Sharman Cheek, MD 04/03/17 [phone]

## 2017-04-03 NOTE — ED Triage Notes (Signed)
Pt presents with chronic low back pain, pt with hx DJD, arthritis. Pt states she is "taking a break" from pain management right now. Pt takes tylenol and using lidocaine cream for lower back pain

## 2017-06-25 IMAGING — CR DG LUMBAR SPINE 2-3V
1 series · 3 of 3 positions shown · non-contrast
Comparison: CT 11/09/2015

CLINICAL DATA: Back pain since fall last week getting worse.

EXAM:
LUMBAR SPINE - 2-3 VIEW

[Series 1: dg lumbar spine 2-3 views · 0.14mm/px · 3 of 3 slices shown]
[im 1/3]
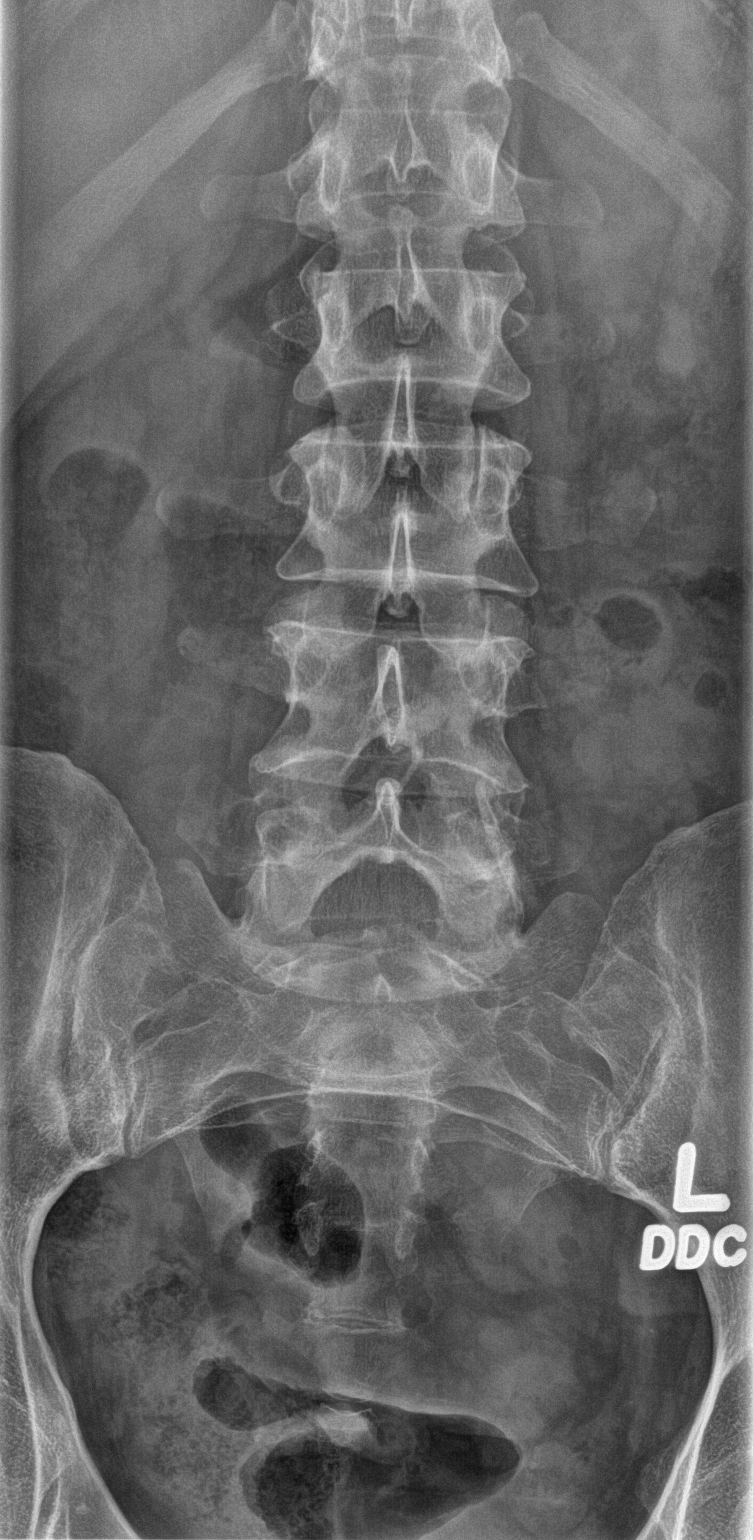
[im 2/3]
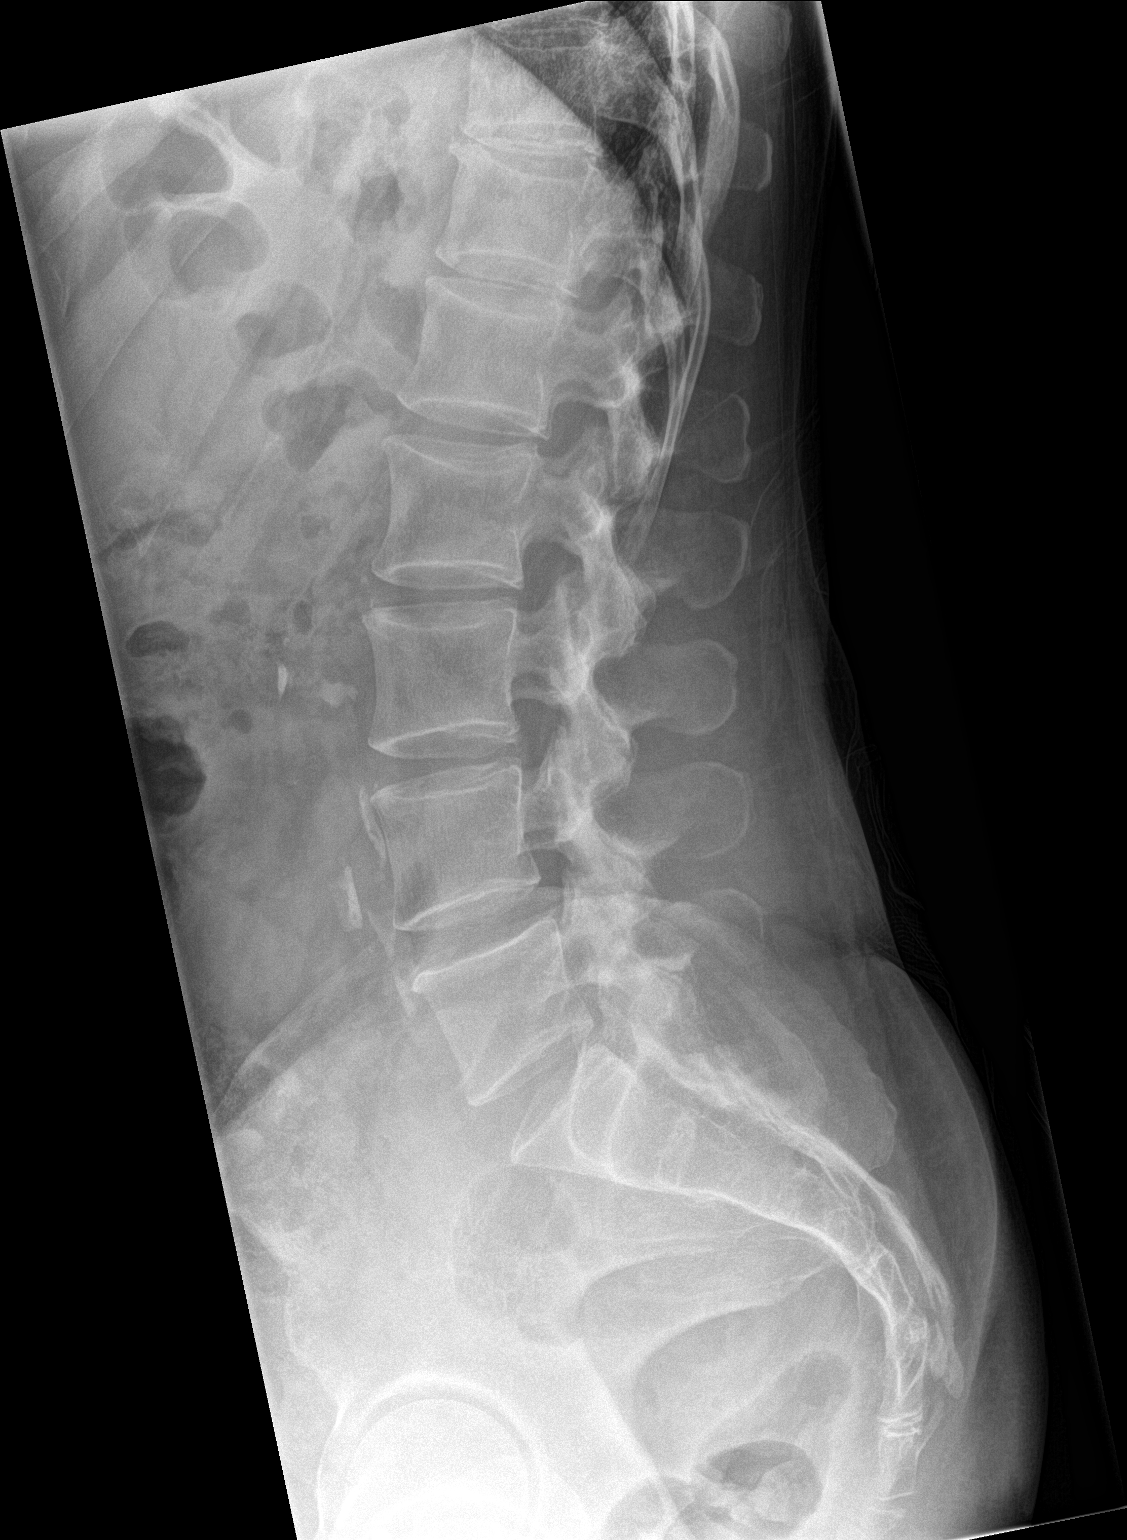
[im 3/3]
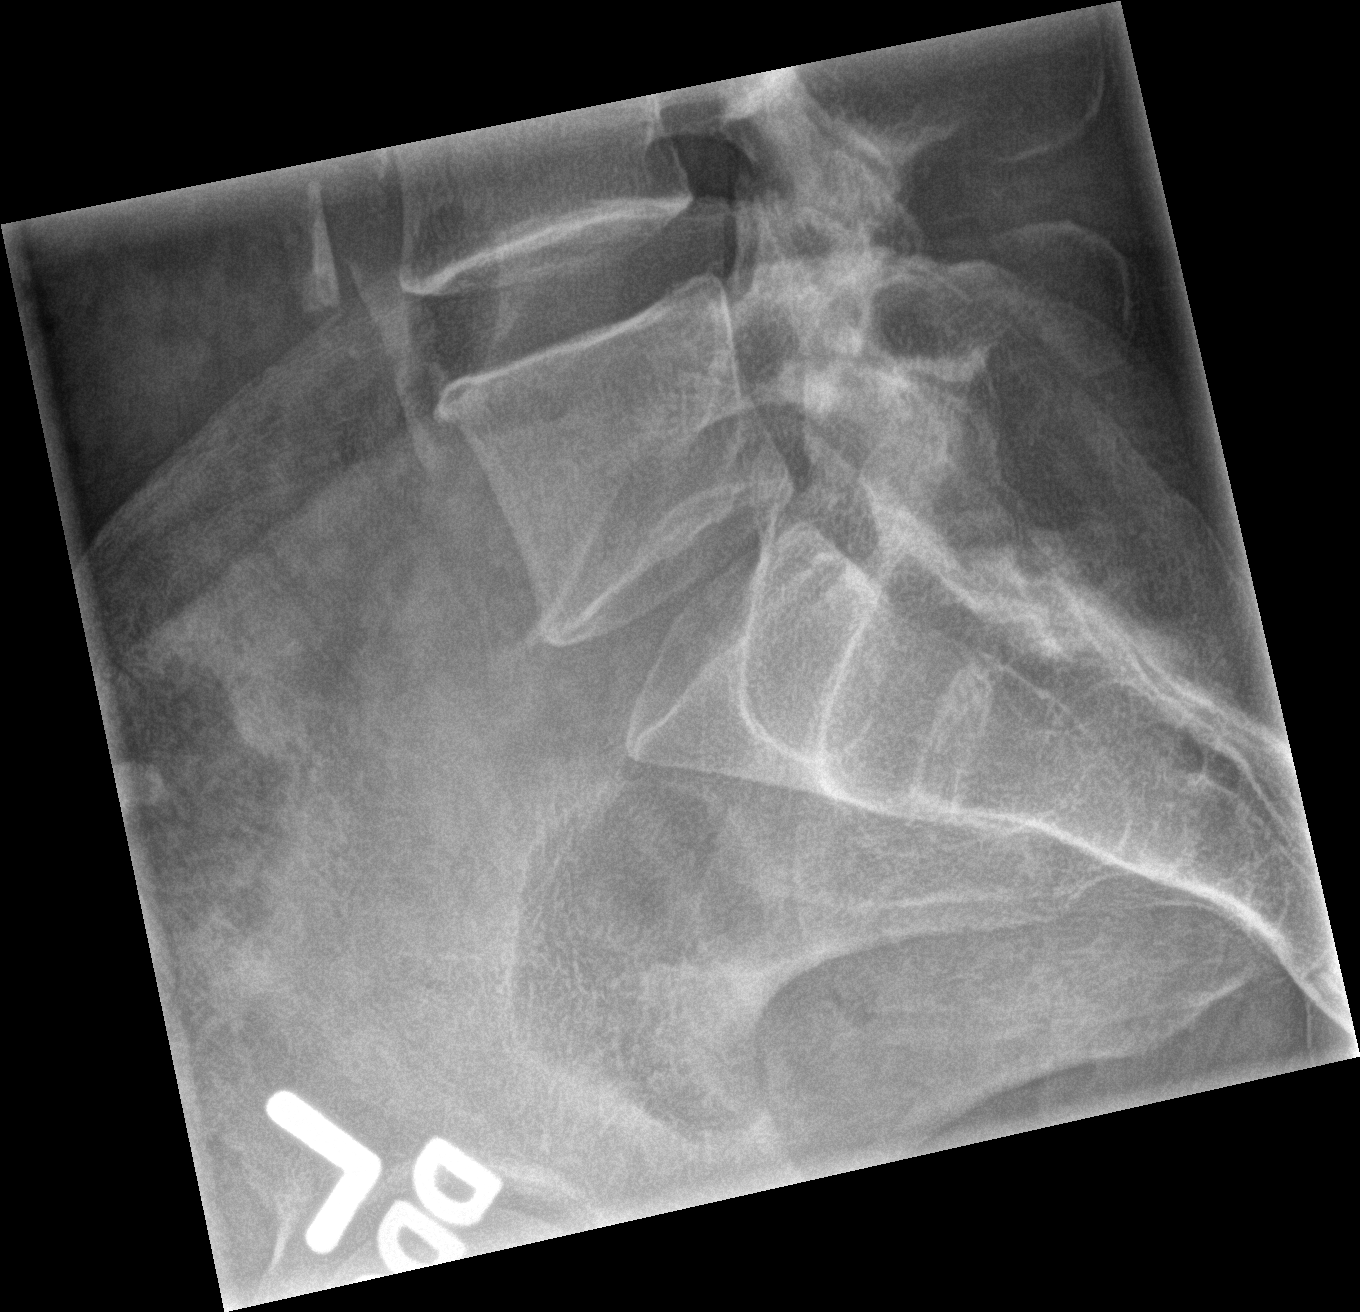

[3 of 3 positions shown; findings below may reference images not displayed]

FINDINGS: Vertebral body alignment, heights and disc space heights are within
normal. There is minimal spondylosis of the lumbar spine to include
facet arthropathy. No compression fracture or subluxation per mild
calcified plaque over the abdominal aorta.
IMPRESSION: No acute findings.

Mild spondylosis of the lumbar spine.

## 2017-08-01 DIAGNOSIS — M961 Postlaminectomy syndrome, not elsewhere classified: Secondary | ICD-10-CM | POA: Insufficient documentation

## 2017-08-01 DIAGNOSIS — M47816 Spondylosis without myelopathy or radiculopathy, lumbar region: Secondary | ICD-10-CM | POA: Insufficient documentation

## 2017-08-01 DIAGNOSIS — F419 Anxiety disorder, unspecified: Secondary | ICD-10-CM | POA: Insufficient documentation

## 2017-08-18 ENCOUNTER — Other Ambulatory Visit: Payer: Self-pay

## 2017-08-18 ENCOUNTER — Emergency Department
Admission: EM | Admit: 2017-08-18 | Discharge: 2017-08-18 | Disposition: A | Discharge status: Home / Self Care | Payer: BLUE CROSS/BLUE SHIELD | Attending: Emergency Medicine | Admitting: Emergency Medicine

## 2017-08-18 DIAGNOSIS — F17210 Nicotine dependence, cigarettes, uncomplicated: Secondary | ICD-10-CM | POA: Diagnosis not present

## 2017-08-18 DIAGNOSIS — G8929 Other chronic pain: Secondary | ICD-10-CM | POA: Insufficient documentation

## 2017-08-18 DIAGNOSIS — M5410 Radiculopathy, site unspecified: Secondary | ICD-10-CM | POA: Diagnosis present

## 2017-08-18 MED ORDER — TRAMADOL HCL 50 MG PO TABS: 50.0000 mg | ORAL_TABLET | Freq: Four times a day (QID) | ORAL | 0 refills | Status: DC | PRN

## 2017-08-18 MED ORDER — CYCLOBENZAPRINE HCL 10 MG PO TABS
10.0000 mg | ORAL_TABLET | Freq: Three times a day (TID) | ORAL | 0 refills | Status: DC | PRN
Start: 2017-08-18 — End: 2018-09-03

## 2017-08-18 MED ORDER — CYCLOBENZAPRINE HCL 10 MG PO TABS
10.0000 mg | ORAL_TABLET | Freq: Once | ORAL | Status: AC
  Administered 2017-08-18: 10 mg via ORAL
  Filled 2017-08-18: qty 1

## 2017-08-18 MED ORDER — METHYLPREDNISOLONE 4 MG PO TBPK: ORAL_TABLET | ORAL | 0 refills | Status: DC

## 2017-08-18 MED ORDER — HYDROMORPHONE HCL 1 MG/ML IJ SOLN
1.0000 mg | Freq: Once | INTRAMUSCULAR | Status: AC
  Administered 2017-08-18: 1 mg via INTRAMUSCULAR
  Filled 2017-08-18: qty 1

## 2017-08-18 MED ORDER — DEXAMETHASONE SODIUM PHOSPHATE 10 MG/ML IJ SOLN
10.0000 mg | Freq: Once | INTRAMUSCULAR | Status: AC
  Administered 2017-08-18: 10 mg via INTRAMUSCULAR
  Filled 2017-08-18: qty 1

## 2017-08-18 NOTE — ED Notes (Signed)
Pt c/o L lower back pain that is chronic in nature. Pt states in July 2018 she quit pain management thinking she could manage on her own, pt states progressively worsening over the last few weeks. Pt states is currently in process to go back to pain management but her appt is not until April 29th and her PCP will not write her any prescriptions for pain medication. Pt denies any recent injury or any changes.

## 2017-08-18 NOTE — ED Triage Notes (Signed)
Pt states chronic pain x 3 months. States sciatica pain. States quit pain management thinking she could manage on own. Has appt again with pain management April 29th but states she can't take it anymore.   Alert, oriented. Took tylenol 2hr PTA.

## 2017-08-18 NOTE — ED Provider Notes (Signed)
[facility] Emergency Department Provider Note   ____________________________________________   First MD Initiated Contact with Patient 08/18/17 1445     (approximate)  I have reviewed the triage vital signs and the nursing notes.   HISTORY  Chief Complaint Sciatica    HPI Katrina HildingDonna Marie Holmes is a 62 y.o. female patient complained acute onset of radicular pain from the left low back to the left lower extremity.  Patient was on pain management contract in July 2018.  Patient was trying to manage the pain conservative measures.  Patient PCP has referred her back to pain management.  Patient does not use narcotics medications since July 2018.  Patient denies bladder or bowel dysfunction.  Patient states the pain has been a 3/10 but has increased to 8/10 in the past week.  No palate this measured for complaint.   Past Medical History:  Diagnosis Date  . Allergy   . Anxiety   . Depression   . Hypertension   . IBS (irritable bowel syndrome) 15 years  . Suicide attempt [facility](HCC)     Patient Active Problem List   Diagnosis Date Noted  . Opioid use disorder, moderate, dependence (HCC) 01/15/2017  . Sedative, hypnotic or anxiolytic use disorder, severe, dependence (HCC) 01/15/2017  . Tobacco use disorder 01/12/2017  . HTN (hypertension) 01/11/2017  . Severe recurrent major depression without psychotic features (HCC) 12/29/2015  . DDD (degenerative disc disease), cervical 05/25/2015  . 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 DDD (degenerative disc disease), lumbar 05/25/2015    Past Surgical History:  Procedure Laterality Date  . ABDOMINAL HYSTERECTOMY    . NECK SURGERY  2006  . SHOULDER ARTHROSCOPY WITH ROTATOR CUFF REPAIR Left    3 surgeries (2006-2007-2008)    Prior to Admission medications   Medication Sig Start Date End Date Taking? Authorizing Provider  aspirin EC 81 MG tablet Take 81 mg by mouth daily.    [provider]  cyclobenzaprine (FLEXERIL)  10 MG tablet Take 1 tablet (10 mg total) by mouth 3 (three) times daily as needed. 08/18/17   Joni ReiningSmith, Ronald K, PA-C  escitalopram (LEXAPRO) 20 MG tablet Take 1 tablet (20 mg total) by mouth daily. 01/16/17   Jimmy FootmanHernandez-Gonzalez, Andrea, MD  loratadine (CLARITIN) 10 MG tablet Take 10 mg by mouth daily.    [provider]  LORazepam (ATIVAN) 1 MG tablet Take 1 tablet (1 mg total) by mouth 2 (two) times daily. 03/12/17 03/12/18  Merrily Brittleifenbark, Neil, MD  methylPREDNISolone (MEDROL DOSEPAK) 4 MG TBPK tablet Take Tapered dose as directed 08/18/17   Joni ReiningSmith, Ronald K, PA-C  traMADol (ULTRAM) 50 MG tablet Take 1 tablet (50 mg total) by mouth every 6 (six) hours as needed. 08/18/17 08/18/18  Joni ReiningSmith, Ronald K, PA-C  traZODone (DESYREL) 100 MG tablet Take 1 tablet (100 mg total) by mouth at bedtime as needed for sleep. 01/15/17   Jimmy FootmanHernandez-Gonzalez, Andrea, MD    Allergies Gabapentin; Nsaids; Trazodone and nefazodone; and Penicillins  Family History  Problem Relation Age of Onset  . Heart disease Mother   . Stroke Father     Social History Social History   Tobacco Use  . Smoking status: Current Every Day Smoker    Packs/day: 0.50    Types: Cigarettes  . Smokeless tobacco: Never Used  Substance Use Topics  . Alcohol use: No  . Drug use: No    Review of Systems  Constitutional: No fever/chills Eyes: No visual changes. ENT: No sore throat. Cardiovascular: Denies chest  pain. Respiratory: Denies shortness of breath. Gastrointestinal: No abdominal pain.  No nausea, no vomiting.  No diarrhea.  No constipation. Genitourinary: Negative for dysuria. Musculoskeletal: Negative for back pain. Skin: Negative for rash. Neurological: Negative for headaches, focal weakness or numbness. Psychiatric:Anxiety/ depression Endocrine:Hypertension Allergic/Immunilogical: The medication list ____________________________________________   PHYSICAL EXAM:  VITAL SIGNS: ED Triage Vitals  Enc Vitals Group     BP  08/18/17 1333 123/66     Pulse Rate 08/18/17 1333 99     Resp 08/18/17 1333 18     Temp 08/18/17 1333 (!) 97.5 F (36.4 C)     Temp Source 08/18/17 1333 Oral     SpO2 08/18/17 1333 98 %     Weight 08/18/17 1333 140 lb (63.5 kg)     Height 08/18/17 1333 5\' 6"  (1.676 m)     Head Circumference --      Peak Flow --      Pain Score 08/18/17 1336 8     Pain Loc --      Pain Edu? --      Excl. in GC? --    Constitutional: Alert and oriented. Well appearing and in no acute distress. Hematological/Lymphatic/Immunilogical: No cervical lymphadenopathy. Cardiovascular: Normal rate, regular rhythm. Grossly normal heart sounds.  Good peripheral circulation. Respiratory: Normal respiratory effort.  No retractions. Lungs CTAB. Musculoskeletal: Patient is ambulating with a typical gait favoring his left lower extremity.  No obvious spinal deformity on examination.  Patient is moderate on palpation of L4 through S1.  Patient decreased range of motion of right lateral and flexion movements.  Patient had negative straight leg test. Neurologic:  Normal speech and language. No gross focal neurologic deficits are appreciated. No gait instability. Skin:  Skin is warm, dry and intact. No rash noted. Psychiatric: Mood and affect are normal. Speech and behavior are normal.  ____________________________________________   LABS (all labs ordered are listed, but only abnormal results are displayed)  Labs Reviewed - No data to display ____________________________________________  EKG   ____________________________________________  RADIOLOGY  ED MD interpretation:    Official radiology report(s): No results found.  ____________________________________________   PROCEDURES  Procedure(s) performed: None  Procedures  Critical Care performed: No  ____________________________________________   INITIAL IMPRESSION / ASSESSMENT AND PLAN / ED COURSE  As part of my medical decision making, I  reviewed the following data within the electronic MEDICAL RECORD NUMBER Notes from prior ED visits and [place] Controlled Substance Database   Chronic radicular back pain.  Patient given discharge care instruction.  Patient advised to seek pain relief from PCP until evaluation by pain clinic.      ____________________________________________   FINAL CLINICAL IMPRESSION(S) / ED DIAGNOSES  Final diagnoses:  Chronic radicular pain of lower extremity     ED Discharge Orders        Ordered    methylPREDNISolone (MEDROL DOSEPAK) 4 MG TBPK tablet     08/18/17 1453    traMADol (ULTRAM) 50 MG tablet  Every 6 hours PRN     08/18/17 1453    cyclobenzaprine (FLEXERIL) 10 MG tablet  3 times daily PRN     08/18/17 1453       Note:  This document was prepared using Dragon voice recognition software and may include unintentional dictation errors.    Joni Reining, PA-C 08/18/17 1501    Governor Rooks, MD 08/18/17 [phone]

## 2017-08-18 NOTE — ED Notes (Signed)
This RN to bedside, pt noted to be heard yelling from her room, "I've been here for 35 fucking minutes!" This RN asked patient if she was okay, pt states "No I'm in a lot of pain". This RN apologized for and explained delay. Pt rolled her eyes. SO remains at bedside at this time.

## 2017-10-22 ENCOUNTER — Emergency Department
Admission: EM | Admit: 2017-10-22 | Discharge: 2017-10-22 | Disposition: A | Discharge status: Home / Self Care | Payer: BLUE CROSS/BLUE SHIELD | Attending: Emergency Medicine | Admitting: Emergency Medicine

## 2017-10-22 ENCOUNTER — Emergency Department: Payer: BLUE CROSS/BLUE SHIELD

## 2017-10-22 ENCOUNTER — Encounter: Payer: Self-pay | Admitting: Emergency Medicine

## 2017-10-22 DIAGNOSIS — Z79899 Other long term (current) drug therapy: Secondary | ICD-10-CM | POA: Insufficient documentation

## 2017-10-22 DIAGNOSIS — Z7982 Long term (current) use of aspirin: Secondary | ICD-10-CM | POA: Diagnosis not present

## 2017-10-22 DIAGNOSIS — F17210 Nicotine dependence, cigarettes, uncomplicated: Secondary | ICD-10-CM | POA: Insufficient documentation

## 2017-10-22 DIAGNOSIS — R600 Localized edema: Secondary | ICD-10-CM | POA: Diagnosis present

## 2017-10-22 DIAGNOSIS — M7918 Myalgia, other site: Secondary | ICD-10-CM

## 2017-10-22 DIAGNOSIS — I10 Essential (primary) hypertension: Secondary | ICD-10-CM | POA: Insufficient documentation

## 2017-10-22 DIAGNOSIS — R609 Edema, unspecified: Secondary | ICD-10-CM

## 2017-10-22 LAB — CBC
HCT: 33.7 % — ABNORMAL LOW (ref 35.0–47.0)
Hemoglobin: 11.5 g/dL — ABNORMAL LOW (ref 12.0–16.0)
MCH: 28.7 pg (ref 26.0–34.0)
MCHC: 34.2 g/dL (ref 32.0–36.0)
MCV: 83.9 fL (ref 80.0–100.0)
PLATELETS: 269 10*3/uL (ref 150–440)
RBC: 4.01 MIL/uL (ref 3.80–5.20)
RDW: 14.9 % — AB (ref 11.5–14.5)
WBC: 7.3 10*3/uL (ref 3.6–11.0)

## 2017-10-22 LAB — BASIC METABOLIC PANEL
Anion gap: 9 (ref 5–15)
BUN: 10 mg/dL (ref 6–20)
CHLORIDE: 100 mmol/L — AB (ref 101–111)
CO2: 29 mmol/L (ref 22–32)
CREATININE: 0.6 mg/dL (ref 0.44–1.00)
Calcium: 8.8 mg/dL — ABNORMAL LOW (ref 8.9–10.3)
GFR calc Af Amer: >60 mL/min (ref >60)
GFR calc non Af Amer: >60 mL/min (ref >60)
Glucose, Bld: 107 mg/dL — ABNORMAL HIGH (ref 65–99)
Potassium: 3.5 mmol/L (ref 3.5–5.1)
Sodium: 138 mmol/L (ref 135–145)

## 2017-10-22 LAB — TROPONIN I: Troponin I: <0.03 ng/mL (ref <0.03)

## 2017-10-22 MED ORDER — TRAMADOL HCL 50 MG PO TABS: 50.0000 mg | ORAL_TABLET | Freq: Four times a day (QID) | ORAL | 0 refills | Status: DC | PRN

## 2017-10-22 NOTE — ED Provider Notes (Signed)
[facility] Emergency Department Provider Note  Time seen: 10:17 AM  I have reviewed the triage vital signs and the nursing notes.   HISTORY  Chief Complaint Blurred Vision; Joint Swelling; and Headache    HPI Katrina Holmes is a 62 y.o. female with a past medical history of depression, hypertension, anxiety, presents to the emergency department with various medical complaints.  According to the patient for the past 4 months she has intermittently been experiencing swelling in her ankles, intermittent headaches and blurred vision.  Patient also states for the past 1 or 2 years she has been expensing pain in her neck and shoulders worse with movement somewhat worse at night causing her difficulty sleeping.  Patient also states for the past 1 year has been experiencing intermittent nausea and vertigo-like symptoms.  When asked what changed today to bring the patient to the emergency department she states the ankle swelling was a little worse yesterday so she decided to come to the emergency department this morning.  Patient also states intermittent chest pains for the past few months denies any pain currently besides her right shoulder which she states is somewhat chronic as well as neck pain which is somewhat chronic as well.  Patient denies any history of CHF.  Does state cardiac disease runs in her family however.   Past Medical History:  Diagnosis Date  . Allergy   . Anxiety   . Depression   . Hypertension   . IBS (irritable bowel syndrome) 15 years  . Suicide attempt [facility])     Patient Active Problem List   Diagnosis Date Noted  . Opioid use disorder, moderate, dependence (HCC) 01/15/2017  . Sedative, hypnotic or anxiolytic use disorder, severe, dependence (HCC) 01/15/2017  . Tobacco use disorder 01/12/2017  . HTN (hypertension) 01/11/2017  . Severe recurrent major depression without psychotic features (HCC) 12/29/2015  . DDD (degenerative disc disease),  cervical 05/25/2015  . 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 DDD (degenerative disc disease), lumbar 05/25/2015    Past Surgical History:  Procedure Laterality Date  . ABDOMINAL HYSTERECTOMY    . NECK SURGERY  2006  . SHOULDER ARTHROSCOPY WITH ROTATOR CUFF REPAIR Left    3 surgeries (2006-2007-2008)    Prior to Admission medications   Medication Sig Start Date End Date Taking? Authorizing Provider  aspirin EC 81 MG tablet Take 81 mg by mouth daily.    [provider]  cyclobenzaprine (FLEXERIL) 10 MG tablet Take 1 tablet (10 mg total) by mouth 3 (three) times daily as needed. 08/18/17   Joni Reining, PA-C  escitalopram (LEXAPRO) 20 MG tablet Take 1 tablet (20 mg total) by mouth daily. 01/16/17   Jimmy Footman, MD  loratadine (CLARITIN) 10 MG tablet Take 10 mg by mouth daily.    [provider]  LORazepam (ATIVAN) 1 MG tablet Take 1 tablet (1 mg total) by mouth 2 (two) times daily. 03/12/17 03/12/18  Merrily Brittle, MD  methylPREDNISolone (MEDROL DOSEPAK) 4 MG TBPK tablet Take Tapered dose as directed 08/18/17   Joni Reining, PA-C  traMADol (ULTRAM) 50 MG tablet Take 1 tablet (50 mg total) by mouth every 6 (six) hours as needed. 08/18/17 08/18/18  Joni Reining, PA-C  traZODone (DESYREL) 100 MG tablet Take 1 tablet (100 mg total) by mouth at bedtime as needed for sleep. 01/15/17   Jimmy Footman, MD    Allergies  Allergen Reactions  . Gabapentin     Bloating   . Nsaids  Nausea And Vomiting  . Trazodone And Nefazodone Diarrhea    And hallucinations   . Penicillins Rash    .Has patient had a PCN reaction causing immediate rash, facial/tongue/throat swelling, SOB or lightheadedness with hypotension: Unknown Has patient had a PCN reaction causing severe rash involving mucus membranes or skin necrosis: Unknown Has patient had a PCN reaction that required hospitalization: Unknown Has patient had a PCN reaction occurring within the last 10 years:  Unknown If all of the above answers are "NO", then may proceed with Cephalosporin use.     Family History  Problem Relation Age of Onset  . Heart disease Mother   . Stroke Father     Social History Social History   Tobacco Use  . Smoking status: Current Every Day Smoker    Packs/day: 0.50    Types: Cigarettes  . Smokeless tobacco: Never Used  Substance Use Topics  . Alcohol use: No  . Drug use: No    Review of Systems Constitutional: Negative for fever. Eyes: Intermittent blurry vision x4 to 6 months ENT: Negative for recent illness/congestion Cardiovascular: Intermittent chest pain times several months none currently Respiratory: Shortness of breath times years, patient is cutting back smoking. Gastrointestinal: Intermittent abdominal pain times years.  States nausea this morning, also intermittently over the past 1 year.  No diarrhea. Genitourinary: Negative for urinary compaints Musculoskeletal: States neck pain and bilateral shoulder pain times years.  Ankle swelling intermittent x4 months Skin: Negative for skin complaints  Neurological: Intermittent headaches over the past 4 to 6 months All other ROS negative  ____________________________________________   PHYSICAL EXAM:  VITAL SIGNS: ED Triage Vitals  Enc Vitals Group     BP 10/22/17 0846 (!) 113/58     Pulse Rate 10/22/17 0846 94     Resp 10/22/17 0846 20     Temp 10/22/17 0846 98.2 F (36.8 C)     Temp Source 10/22/17 0846 Oral     SpO2 10/22/17 0846 95 %     Weight 10/22/17 0847 146 lb (66.2 kg)     Height 10/22/17 0847  (1.676 m)     Head Circumference --      Peak Flow --      Pain Score 10/22/17 0847 8     Pain Loc --      Pain Edu? --      Excl. in GC? --    Constitutional: Alert and oriented. Well appearing and in no distress. Eyes: Normal exam ENT   Head: Normocephalic and atraumatic.   Mouth/Throat: Mucous membranes are moist. Cardiovascular: Normal rate, regular rhythm. No  murmur Respiratory: Normal respiratory effort without tachypnea nor retractions. Breath sounds are clear Gastrointestinal: Soft and nontender. No distention.   Musculoskeletal: Nontender with normal range of motion in all extremities.  There is a minimal pedal edema, neurovascular intact distally with palpable DP pulses.  Bilateral shoulder tenderness and neck tenderness to palpation and range of motion. Neurologic:  Normal speech and language. No gross focal neurologic deficits  Skin:  Skin is warm, dry and intact.  Psychiatric: Mood and affect are normal.   ____________________________________________    EKG  EKG reviewed and interpreted by myself shows normal sinus rhythm at 90 bpm with a narrow QRS, normal axis, normal intervals, no concerning ST changes.  ____________________________________________    RADIOLOGY  Chest x-ray negative  ____________________________________________   INITIAL IMPRESSION / ASSESSMENT AND PLAN / ED COURSE  Pertinent labs & imaging results that were available during  my care of the patient were reviewed by me and considered in my medical decision making (see chart for details).  Patient presents to the emergency department for various complaints of intermittent headache, blurred vision, chest pains, ankle swelling, nausea, dizziness neck pain and shoulder pains.  Differential is quite broad but would include muscular skeletal type pains, arthritis type pains, chest wall discomfort, ACS, CHF, peripheral edema.  We will check labs including cardiac enzymes.  Chest x-ray and EKG.  Patient's work-up is been largely nonrevealing.  Lab work is largely within normal limits.  Troponin negative.  Chest x-ray negative.  EKG is reassuring.  Given the negative work-up in the emergency department I discussed with the patient the need to follow-up with her primary care doctor as well as a cardiologist for a stress test.  Patient is agreeable to this plan of  care.  ____________________________________________   FINAL CLINICAL IMPRESSION(S) / ED DIAGNOSES  Peripheral edema Musculoskeletal pain    Minna Antis, MD 10/22/17 1051

## 2017-10-22 NOTE — ED Triage Notes (Signed)
Pt reports intermittent ankle swelling, headache and blurred vision for the past 4 months. Pt c/o pain to her back and feet right now and to her right ear.

## 2017-10-22 NOTE — ED Triage Notes (Signed)
Pt also reports pain to her right shoulder that goes all the way down to her right foot.

## 2018-01-11 ENCOUNTER — Encounter: Payer: Self-pay | Admitting: Emergency Medicine

## 2018-01-11 ENCOUNTER — Emergency Department
Admission: EM | Admit: 2018-01-11 | Discharge: 2018-01-11 | Disposition: A | Discharge status: Home / Self Care | Payer: BLUE CROSS/BLUE SHIELD | Attending: Emergency Medicine | Admitting: Emergency Medicine

## 2018-01-11 ENCOUNTER — Other Ambulatory Visit: Payer: Self-pay

## 2018-01-11 DIAGNOSIS — R109 Unspecified abdominal pain: Secondary | ICD-10-CM | POA: Diagnosis present

## 2018-01-11 DIAGNOSIS — I10 Essential (primary) hypertension: Secondary | ICD-10-CM | POA: Diagnosis not present

## 2018-01-11 DIAGNOSIS — Z7982 Long term (current) use of aspirin: Secondary | ICD-10-CM | POA: Diagnosis not present

## 2018-01-11 DIAGNOSIS — Z79899 Other long term (current) drug therapy: Secondary | ICD-10-CM | POA: Insufficient documentation

## 2018-01-11 DIAGNOSIS — F17210 Nicotine dependence, cigarettes, uncomplicated: Secondary | ICD-10-CM | POA: Insufficient documentation

## 2018-01-11 DIAGNOSIS — R112 Nausea with vomiting, unspecified: Secondary | ICD-10-CM | POA: Insufficient documentation

## 2018-01-11 DIAGNOSIS — R1084 Generalized abdominal pain: Secondary | ICD-10-CM

## 2018-01-11 LAB — URINALYSIS, COMPLETE (UACMP) WITH MICROSCOPIC
Bilirubin Urine: NEGATIVE
Glucose, UA: NEGATIVE mg/dL
Hgb urine dipstick: NEGATIVE
Ketones, ur: NEGATIVE mg/dL
Leukocytes, UA: NEGATIVE
Nitrite: NEGATIVE
Protein, ur: NEGATIVE mg/dL
SPECIFIC GRAVITY, URINE: 1.005 (ref 1.005–1.030)
pH: 7 (ref 5.0–8.0)

## 2018-01-11 LAB — COMPREHENSIVE METABOLIC PANEL
ALBUMIN: 4.3 g/dL (ref 3.5–5.0)
ALK PHOS: 68 U/L (ref 38–126)
ALT: 16 U/L (ref 0–44)
AST: 29 U/L (ref 15–41)
Anion gap: 10 (ref 5–15)
BUN: 7 mg/dL (ref 6–20)
CALCIUM: 9.4 mg/dL (ref 8.9–10.3)
CO2: 22 mmol/L (ref 22–32)
CREATININE: 0.71 mg/dL (ref 0.44–1.00)
Chloride: 105 mmol/L (ref 98–111)
GFR calc Af Amer: >60 mL/min (ref >60)
GFR calc non Af Amer: >60 mL/min (ref >60)
GLUCOSE: 96 mg/dL (ref 70–99)
Potassium: 4.3 mmol/L (ref 3.5–5.1)
SODIUM: 137 mmol/L (ref 135–145)
Total Bilirubin: 1.2 mg/dL (ref 0.3–1.2)
Total Protein: 8 g/dL (ref 6.5–8.1)

## 2018-01-11 LAB — CBC
HCT: 41.1 % (ref 35.0–47.0)
HEMOGLOBIN: 14.2 g/dL (ref 12.0–16.0)
MCH: 27.6 pg (ref 26.0–34.0)
MCHC: 34.5 g/dL (ref 32.0–36.0)
MCV: 80.1 fL (ref 80.0–100.0)
PLATELETS: 343 10*3/uL (ref 150–440)
RBC: 5.13 MIL/uL (ref 3.80–5.20)
RDW: 16.3 % — ABNORMAL HIGH (ref 11.5–14.5)
WBC: 8.2 10*3/uL (ref 3.6–11.0)

## 2018-01-11 LAB — LIPASE, BLOOD: Lipase: 25 U/L (ref 11–51)

## 2018-01-11 LAB — TROPONIN I

## 2018-01-11 MED ORDER — ONDANSETRON 4 MG PO TBDP: 4.0000 mg | ORAL_TABLET | Freq: Three times a day (TID) | ORAL | 0 refills | Status: DC | PRN

## 2018-01-11 MED ORDER — HYDROCODONE-ACETAMINOPHEN 5-325 MG PO TABS: 1.0000 | ORAL_TABLET | ORAL | 0 refills | Status: DC | PRN

## 2018-01-11 MED ORDER — MORPHINE SULFATE (PF) 4 MG/ML IV SOLN
4.0000 mg | Freq: Once | INTRAVENOUS | Status: AC
  Administered 2018-01-11: 4 mg via INTRAVENOUS
  Filled 2018-01-11: qty 1

## 2018-01-11 MED ORDER — ONDANSETRON HCL 4 MG/2ML IJ SOLN
4.0000 mg | Freq: Once | INTRAMUSCULAR | Status: AC
  Administered 2018-01-11: 4 mg via INTRAVENOUS
  Filled 2018-01-11: qty 2

## 2018-01-11 MED ORDER — SODIUM CHLORIDE 0.9 % IV BOLUS
1000.0000 mL | Freq: Once | INTRAVENOUS | Status: AC
  Administered 2018-01-11: 1000 mL via INTRAVENOUS

## 2018-01-11 NOTE — ED Notes (Signed)
.   Pt is resting, Respirations even and unlabored, NAD. Stretcher lowest postion and locked. Call bell within reach. Denies any needs at this time RN will continue to monitor.    

## 2018-01-11 NOTE — ED Notes (Addendum)
Pt up and ambulatory to the bathroom. Urine sample sent at this time.

## 2018-01-11 NOTE — ED Provider Notes (Signed)
[facility] Emergency Department Provider Note  Time seen: 7:32 AM  I have reviewed the triage vital signs and the nursing notes.   HISTORY  Chief Complaint Abdominal Pain and Nausea    HPI Katrina Holmes is a 62 y.o. female with a past medical history of anxiety, depression, hypertension, presents to the emergency department for nausea vomiting and abdominal pain.  According to the patient for the past 6 days she has been very nauseated with intermittent episodes of vomiting and states fairly diffuse abdominal cramping.  States diarrhea just started yesterday.  Denies any black or bloody stool, dysuria, hematuria or fever.  Patient states intermittent chest tightness but that has been ongoing for over a month since being diagnosed for bronchitis.  Patient attempted to call her doctor to be seen for the nausea and vomiting but they are unable to see her until next week so she came to the emergency department.   Past Medical History:  Diagnosis Date  . Allergy   . Anxiety   . Depression   . Hypertension   . IBS (irritable bowel syndrome) 15 years  . Suicide attempt [facility](HCC)     Patient Active Problem List   Diagnosis Date Noted  . Opioid use disorder, moderate, dependence (HCC) 01/15/2017  . Sedative, hypnotic or anxiolytic use disorder, severe, dependence (HCC) 01/15/2017  . Tobacco use disorder 01/12/2017  . HTN (hypertension) 01/11/2017  . Severe recurrent major depression without psychotic features (HCC) 12/29/2015  . DDD (degenerative disc disease), cervical 05/25/2015  . 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 DDD (degenerative disc disease), lumbar 05/25/2015    Past Surgical History:  Procedure Laterality Date  . ABDOMINAL HYSTERECTOMY    . NECK SURGERY  2006  . SHOULDER ARTHROSCOPY WITH ROTATOR CUFF REPAIR Left    3 surgeries (2006-2007-2008)    Prior to Admission medications   Medication Sig Start Date End Date Taking? Authorizing Provider   aspirin EC 81 MG tablet Take 81 mg by mouth daily.    [provider]  clonazePAM (KLONOPIN) 0.5 MG tablet Take 0.5 mg by mouth 3 (three) times daily as needed (panic attacks).  10/17/17   [provider]  cloNIDine (CATAPRES) 0.1 MG tablet Take 0.1 mg by mouth at bedtime. 10/22/17   [provider]  cyclobenzaprine (FLEXERIL) 10 MG tablet Take 1 tablet (10 mg total) by mouth 3 (three) times daily as needed. 08/18/17   Joni ReiningSmith, Ronald K, PA-C  escitalopram (LEXAPRO) 20 MG tablet Take 1 tablet (20 mg total) by mouth daily. 01/16/17   Jimmy FootmanHernandez-Gonzalez, Andrea, MD  fluticasone (FLONASE) 50 MCG/ACT nasal spray Place 1 spray into both nostrils daily. 09/16/17   [provider]  lisinopril (PRINIVIL,ZESTRIL) 5 MG tablet Take 5 mg by mouth daily. 10/17/17   [provider]  loratadine (CLARITIN) 10 MG tablet Take 10 mg by mouth daily.    [provider]  LORazepam (ATIVAN) 1 MG tablet Take 1 tablet (1 mg total) by mouth 2 (two) times daily. 03/12/17 03/12/18  Merrily Brittleifenbark, Neil, MD  methylPREDNISolone (MEDROL DOSEPAK) 4 MG TBPK tablet Take Tapered dose as directed 08/18/17   Joni ReiningSmith, Ronald K, PA-C  omeprazole (PRILOSEC) 20 MG capsule Take 20 mg by mouth daily. 10/14/17   [provider]  risperiDONE (RISPERDAL) 1 MG tablet Take 1 mg by mouth at bedtime. 10/17/17   [provider]  traMADol (ULTRAM) 50 MG tablet Take 1 tablet (50 mg total) by mouth every 6 (six) hours as  needed. 10/22/17   Minna Antis, MD  traZODone (DESYREL) 100 MG tablet Take 1 tablet (100 mg total) by mouth at bedtime as needed for sleep. 01/15/17   Jimmy Footman, MD  VENTOLIN HFA 108 (90 Base) MCG/ACT inhaler Inhale 2 puffs into the lungs every 6 (six) hours as needed for wheezing or shortness of breath. 10/22/17   [provider]    Allergies  Allergen Reactions  . Gabapentin     Bloating   . Nsaids Nausea And Vomiting  . Trazodone And Nefazodone  Diarrhea    And hallucinations   . Penicillins Rash    .Has patient had a PCN reaction causing immediate rash, facial/tongue/throat swelling, SOB or lightheadedness with hypotension: Unknown Has patient had a PCN reaction causing severe rash involving mucus membranes or skin necrosis: Unknown Has patient had a PCN reaction that required hospitalization: Unknown Has patient had a PCN reaction occurring within the last 10 years: Unknown If all of the above answers are "NO", then may proceed with Cephalosporin use.     Family History  Problem Relation Age of Onset  . Heart disease Mother   . Stroke Father     Social History Social History   Tobacco Use  . Smoking status: Current Every Day Smoker    Packs/day: 0.50    Types: Cigarettes  . Smokeless tobacco: Never Used  Substance Use Topics  . Alcohol use: No  . Drug use: No    Review of Systems Constitutional: Negative for fever. Eyes: Negative for visual complaints ENT: Negative for recent illness/congestion Cardiovascular: Mild chest pressure greater than 1 month Respiratory: Positive for cough, states she is diagnosed with bronchitis Gastrointestinal: Fairly diffuse abdominal cramping.  Positive for nausea vomiting and diarrhea Genitourinary: Negative for dysuria or hematuria Musculoskeletal: Negative for musculoskeletal complaints Skin: Negative for skin complaints  Neurological: Negative for headache All other ROS negative  ____________________________________________   PHYSICAL EXAM:  VITAL SIGNS: ED Triage Vitals  Enc Vitals Group     BP 01/11/18 0715 (!) 158/76     Pulse Rate 01/11/18 0715 (!) 108     Resp 01/11/18 0715 18     Temp 01/11/18 0715 98 F (36.7 C)     Temp Source 01/11/18 0715 Oral     SpO2 01/11/18 0715 98 %     Weight 01/11/18 0716 142 lb (64.4 kg)     Height 01/11/18 0716 5\' 6"  (1.676 m)     Head Circumference --      Peak Flow --      Pain Score 01/11/18 0715 6     Pain Loc --       Pain Edu? --      Excl. in GC? --    Constitutional: Alert and oriented. Well appearing and in no distress. Eyes: Normal exam ENT   Head: Normocephalic and atraumatic.   Mouth/Throat: Mucous membranes are moist. Cardiovascular: Normal rate, regular rhythm. No murmur Respiratory: Normal respiratory effort without tachypnea nor retractions. Breath sounds are clear  Gastrointestinal: Soft and nontender. No distention.   Musculoskeletal: Nontender with normal range of motion in all extremities. Neurologic:  Normal speech and language. No gross focal neurologic deficits Skin:  Skin is warm, dry and intact.  Psychiatric: Mood and affect are normal.  ____________________________________________    EKG  EKG reviewed and interpreted by myself shows sinus tachycardia at 102 bpm with a narrow QRS, normal axis, normal intervals, no concerning ST changes.  ____________________________________________   INITIAL IMPRESSION /  ASSESSMENT AND PLAN / ED COURSE  Pertinent labs & imaging results that were available during my care of the patient were reviewed by me and considered in my medical decision making (see chart for details).  Patient presents to the emergency department for nausea vomiting diarrhea and abdominal cramping.  Patient does have mild diffuse abdominal tenderness without focal area of tenderness identified.  Differential would include gastroenteritis, enteritis, gastritis, intra-abdominal pathology or infection.  We will check labs, urinalysis, treat pain, nausea, IV hydrate.  Patient agreeable to plan of care.  Patient's labs have resulted largely within normal limits.  Normal white blood cell count, negative troponin, urinalysis is normal, chemistry is largely at baseline.  Lipase negative.  Overall the patient appears well, is feeling much better after fluids and pain control.  We will discharge home with a very short course of pain and nausea medication.  Patient agreeable to  plan of care.  ____________________________________________   FINAL CLINICAL IMPRESSION(S) / ED DIAGNOSES  Nausea/vomiting abdominal cramping    Minna Antis, MD 01/11/18 1014

## 2018-01-11 NOTE — ED Notes (Signed)
Pt states she has been n/v for the past week. Pt is NAD awaiting EDP at this time.

## 2018-01-11 NOTE — ED Triage Notes (Signed)
PT arrived with complaints of abdominal pain and nausea. PT denies any vomiting. PT reports no relief with OTC medication. Pt states she feels like her "heart is pounding out of her chest."

## 2018-01-11 NOTE — ED Notes (Signed)
Pt given a warm blanket 

## 2018-02-05 ENCOUNTER — Emergency Department
Admission: EM | Admit: 2018-02-05 | Discharge: 2018-02-05 | Disposition: A | Discharge status: Home / Self Care | Payer: BLUE CROSS/BLUE SHIELD | Attending: Emergency Medicine | Admitting: Emergency Medicine

## 2018-02-05 ENCOUNTER — Encounter: Payer: Self-pay | Admitting: Emergency Medicine

## 2018-02-05 ENCOUNTER — Emergency Department: Payer: BLUE CROSS/BLUE SHIELD

## 2018-02-05 ENCOUNTER — Other Ambulatory Visit: Payer: Self-pay

## 2018-02-05 DIAGNOSIS — I10 Essential (primary) hypertension: Secondary | ICD-10-CM | POA: Diagnosis not present

## 2018-02-05 DIAGNOSIS — R109 Unspecified abdominal pain: Secondary | ICD-10-CM

## 2018-02-05 DIAGNOSIS — Z79899 Other long term (current) drug therapy: Secondary | ICD-10-CM | POA: Diagnosis not present

## 2018-02-05 DIAGNOSIS — Z7982 Long term (current) use of aspirin: Secondary | ICD-10-CM | POA: Insufficient documentation

## 2018-02-05 DIAGNOSIS — R112 Nausea with vomiting, unspecified: Secondary | ICD-10-CM

## 2018-02-05 DIAGNOSIS — F17210 Nicotine dependence, cigarettes, uncomplicated: Secondary | ICD-10-CM | POA: Insufficient documentation

## 2018-02-05 DIAGNOSIS — R197 Diarrhea, unspecified: Secondary | ICD-10-CM | POA: Insufficient documentation

## 2018-02-05 DIAGNOSIS — R1031 Right lower quadrant pain: Secondary | ICD-10-CM | POA: Insufficient documentation

## 2018-02-05 LAB — CBC
HEMATOCRIT: 40.6 % (ref 35.0–47.0)
HEMOGLOBIN: 13.5 g/dL (ref 12.0–16.0)
MCH: 27.2 pg (ref 26.0–34.0)
MCHC: 33.3 g/dL (ref 32.0–36.0)
MCV: 81.6 fL (ref 80.0–100.0)
Platelets: 398 10*3/uL (ref 150–440)
RBC: 4.97 MIL/uL (ref 3.80–5.20)
RDW: 16.4 % — ABNORMAL HIGH (ref 11.5–14.5)
WBC: 9 10*3/uL (ref 3.6–11.0)

## 2018-02-05 LAB — URINALYSIS, COMPLETE (UACMP) WITH MICROSCOPIC
BACTERIA UA: NONE SEEN
Bilirubin Urine: NEGATIVE
GLUCOSE, UA: NEGATIVE mg/dL
Hgb urine dipstick: NEGATIVE
KETONES UR: NEGATIVE mg/dL
LEUKOCYTES UA: NEGATIVE
Nitrite: NEGATIVE
PROTEIN: NEGATIVE mg/dL
Specific Gravity, Urine: 1.009 (ref 1.005–1.030)
pH: 6 (ref 5.0–8.0)

## 2018-02-05 LAB — COMPREHENSIVE METABOLIC PANEL
ALT: 14 U/L (ref 0–44)
AST: 24 U/L (ref 15–41)
Albumin: 4.6 g/dL (ref 3.5–5.0)
Alkaline Phosphatase: 62 U/L (ref 38–126)
Anion gap: 13 (ref 5–15)
BUN: 8 mg/dL (ref 6–20)
CHLORIDE: 104 mmol/L (ref 98–111)
CO2: 22 mmol/L (ref 22–32)
Calcium: 9.5 mg/dL (ref 8.9–10.3)
Creatinine, Ser: 0.49 mg/dL (ref 0.44–1.00)
GFR calc non Af Amer: >60 mL/min (ref >60)
Glucose, Bld: 96 mg/dL (ref 70–99)
POTASSIUM: 3.5 mmol/L (ref 3.5–5.1)
Sodium: 139 mmol/L (ref 135–145)
Total Bilirubin: 0.7 mg/dL (ref 0.3–1.2)
Total Protein: 8.4 g/dL — ABNORMAL HIGH (ref 6.5–8.1)

## 2018-02-05 LAB — LIPASE, BLOOD: LIPASE: 28 U/L (ref 11–51)

## 2018-02-05 MED ORDER — ONDANSETRON HCL 4 MG/2ML IJ SOLN
4.0000 mg | Freq: Once | INTRAMUSCULAR | Status: AC
  Administered 2018-02-05: 4 mg via INTRAVENOUS
  Filled 2018-02-05: qty 2

## 2018-02-05 MED ORDER — ONDANSETRON 4 MG PO TBDP: 4.0000 mg | ORAL_TABLET | Freq: Three times a day (TID) | ORAL | 0 refills | Status: DC | PRN

## 2018-02-05 MED ORDER — ONDANSETRON 4 MG PO TBDP
4.0000 mg | ORAL_TABLET | Freq: Once | ORAL | Status: AC | PRN
  Administered 2018-02-05: 4 mg via ORAL
  Filled 2018-02-05: qty 1

## 2018-02-05 MED ORDER — SODIUM CHLORIDE 0.9 % IV BOLUS: 1000.0000 mL | Freq: Once | INTRAVENOUS | Status: DC

## 2018-02-05 MED ORDER — IOPAMIDOL (ISOVUE-300) INJECTION 61%
100.0000 mL | Freq: Once | INTRAVENOUS | Status: DC | PRN
  Filled 2018-02-05: qty 100

## 2018-02-05 NOTE — Discharge Instructions (Signed)
You have been seen in the Emergency Department (ED) for abdominal pain.  Your evaluation did not identify a clear cause of your symptoms but was generally reassuring.  Abdominal pain has many possible causes. Some aren't serious and get better on their own in a few days. Others need more testing and treatment. If your pain continues or gets worse, you need to be rechecked and may need more tests to find out what is wrong. You may need surgery to correct the problem.   It could be that your symptoms are a side effect from your statin medication.  Please stop taking that now.  Take Zofran as needed for nausea.  If your symptoms are ongoing when you see your doctor you may need to be evaluated by a GI specialist.  As I explained to you I have been unable to rule out appendicitis since you have refused your CT scan.  If the pain returns on the right lower quadrant, if you have a fever, or if your nausea vomiting or worse please come back to the emergency room for further evaluation.  Follow up with your doctor in 12-24 hours if you are still having abdominal pain. Otherwise follow up in 1-3 days for a re-check  Don't ignore new symptoms, such as fever, nausea and vomiting, new or worsening abdominal pain, urination problems, bloody diarrhea or bloody stools, black tarry stools, uncontrollable nausea and vomiting, and dizziness. These may be signs of a more serious problem. If you develop any of these you should be seen by your doctor immediately or return to the ED.   How can you care for yourself at home?  Rest until you feel better.  To prevent dehydration, drink plenty of fluids, enough so that your urine is light yellow or clear like water. Choose water and other caffeine-free clear liquids until you feel better. If you have kidney, heart, or liver disease and have to limit fluids, talk with your doctor before you increase the amount of fluids you drink.  If your stomach is upset, eat mild foods, such  as rice, dry toast or crackers, bananas, and applesauce. Try eating several small meals instead of two or three large ones.  Wait until 48 hours after all symptoms have gone away before you have spicy foods, alcohol, and drinks that contain caffeine.  Do not eat foods that are high in fat.  Avoid anti-inflammatory medicines such as aspirin, ibuprofen (Advil, Motrin), and naproxen (Aleve). These can cause stomach upset. Talk to your doctor if you take daily aspirin for another health problem.  When should you call for help?  Call 911 anytime you think you may need emergency care. For example, call if:  You passed out (lost consciousness).  You pass maroon or very bloody stools.  You vomit blood or what looks like coffee grounds.  You have new, severe belly pain.  Call your doctor now or seek immediate medical care if:  Your pain gets worse, especially if it becomes focused in one area of your belly.  You have a new or higher fever.  Your stools are black and look like tar, or they have streaks of blood.  You have unexpected vaginal bleeding.  You have symptoms of a urinary tract infection. These may include:  Pain when you urinate.  Urinating more often than usual.  Blood in your urine. You are dizzy or lightheaded, or you feel like you may faint. Watch closely for changes in your health, and be sure  to contact your doctor if:  You are not getting better after 1 day (24 hours).

## 2018-02-05 NOTE — ED Triage Notes (Addendum)
Pt to ED via POV with c/o nausea and vomitting, states has been intermit since July 26, states given RX for zofran and pain meds. Pt states symptoms are not resolved. Denies fever. VSS . Pt states since she started Lisinopril she has been nauseated and vomiting

## 2018-02-05 NOTE — ED Provider Notes (Signed)
[facility] Emergency Department Provider Note  ____________________________________________  Time seen: Approximately 3:08 PM  I have reviewed the triage vital signs and the nursing notes.   HISTORY  Chief Complaint Emesis   HPI Katrina Holmes is a 62 y.o. female with a history of IBS, hypertension, depression, anxiety who presents for evaluation of nausea and vomiting.  Patient reports that she has had nausea, vomiting, and abdominal cramping for over a month now.  She reports that her symptoms started a few weeks after being started on lisinopril and simvastatin and she is concerned that the medications could be the cause of her symptoms.  She reports daily nausea with intermittent episodes of nonbloody nonbilious emesis and intermittent abdominal cramping.  Over the last 3 days patient reports that her symptoms got worse.  She complains that her crampy abdominal pain is mostly on the right lower quadrant, currently moderate in intensity, and nonradiating.  Patient reports that her nausea has been very severe today.  She reports 3 episodes of watery diarrhea today.  Patient has had a hysterectomy but no other abdominal surgeries.  No fever chills, no dysuria or hematuria.  No chest pain or shortness of breath.   Past Medical History:  Diagnosis Date  . Allergy   . Anxiety   . Depression   . Hypertension   . IBS (irritable bowel syndrome) 15 years  . Suicide attempt [facility](HCC)     Patient Active Problem List   Diagnosis Date Noted  . Opioid use disorder, moderate, dependence (HCC) 01/15/2017  . Sedative, hypnotic or anxiolytic use disorder, severe, dependence (HCC) 01/15/2017  . Tobacco use disorder 01/12/2017  . HTN (hypertension) 01/11/2017  . Severe recurrent major depression without psychotic features (HCC) 12/29/2015  . DDD (degenerative disc disease), cervical 05/25/2015  . 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 DDD (degenerative disc disease), lumbar  05/25/2015    Past Surgical History:  Procedure Laterality Date  . ABDOMINAL HYSTERECTOMY    . NECK SURGERY  2006  . SHOULDER ARTHROSCOPY WITH ROTATOR CUFF REPAIR Left    3 surgeries (2006-2007-2008)    Prior to Admission medications   Medication Sig Start Date End Date Taking? Authorizing Provider  aspirin EC 81 MG tablet Take 81 mg by mouth daily.    [provider]  clonazePAM (KLONOPIN) 0.5 MG tablet Take 0.5 mg by mouth 3 (three) times daily as needed (panic attacks).  10/17/17   [provider]  cloNIDine (CATAPRES) 0.1 MG tablet Take 0.1 mg by mouth at bedtime. 10/22/17   [provider]  cyclobenzaprine (FLEXERIL) 10 MG tablet Take 1 tablet (10 mg total) by mouth 3 (three) times daily as needed. 08/18/17   Joni ReiningSmith, Ronald K, PA-C  escitalopram (LEXAPRO) 20 MG tablet Take 1 tablet (20 mg total) by mouth daily. 01/16/17   Jimmy FootmanHernandez-Gonzalez, Andrea, MD  fluticasone (FLONASE) 50 MCG/ACT nasal spray Place 1 spray into both nostrils daily. 09/16/17   [provider]  HYDROcodone-acetaminophen (NORCO/VICODIN) 5-325 MG tablet Take 1 tablet by mouth every 4 (four) hours as needed. 01/11/18   Minna AntisPaduchowski, Kevin, MD  lisinopril (PRINIVIL,ZESTRIL) 5 MG tablet Take 5 mg by mouth daily. 10/17/17   [provider]  loratadine (CLARITIN) 10 MG tablet Take 10 mg by mouth daily.    [provider]  LORazepam (ATIVAN) 1 MG tablet Take 1 tablet (1 mg total) by mouth 2 (two) times daily. 03/12/17 03/12/18  Merrily Brittleifenbark, Neil, MD  methylPREDNISolone (MEDROL DOSEPAK) 4 MG TBPK  tablet Take Tapered dose as directed 08/18/17   Joni ReiningSmith, Ronald K, PA-C  omeprazole (PRILOSEC) 20 MG capsule Take 20 mg by mouth daily. 10/14/17   [provider]  ondansetron (ZOFRAN ODT) 4 MG disintegrating tablet Take 1 tablet (4 mg total) by mouth every 8 (eight) hours as needed for nausea or vomiting. 02/05/18   Don PerkingVeronese, [place], MD  risperiDONE (RISPERDAL) 1 MG tablet Take 1 mg by  mouth at bedtime. 10/17/17   [provider]  traMADol (ULTRAM) 50 MG tablet Take 1 tablet (50 mg total) by mouth every 6 (six) hours as needed. 10/22/17   Minna AntisPaduchowski, Kevin, MD  traZODone (DESYREL) 100 MG tablet Take 1 tablet (100 mg total) by mouth at bedtime as needed for sleep. 01/15/17   Jimmy FootmanHernandez-Gonzalez, Andrea, MD  VENTOLIN HFA 108 (90 Base) MCG/ACT inhaler Inhale 2 puffs into the lungs every 6 (six) hours as needed for wheezing or shortness of breath. 10/22/17   [provider]    Allergies Gabapentin; Nsaids; Trazodone and nefazodone; and Penicillins  Family History  Problem Relation Age of Onset  . Heart disease Mother   . Stroke Father     Social History Social History   Tobacco Use  . Smoking status: Current Every Day Smoker    Packs/day: 0.50    Types: Cigarettes  . Smokeless tobacco: Never Used  Substance Use Topics  . Alcohol use: No  . Drug use: No    Review of Systems  Constitutional: Negative for fever. Eyes: Negative for visual changes. ENT: Negative for sore throat. Neck: No neck pain  Cardiovascular: Negative for chest pain. Respiratory: Negative for shortness of breath. Gastrointestinal: + cramping abdominal pain, nausea, vomiting and diarrhea. Genitourinary: Negative for dysuria. Musculoskeletal: Negative for back pain. Skin: Negative for rash. Neurological: Negative for headaches, weakness or numbness. Psych: No SI or HI  ____________________________________________   PHYSICAL EXAM:  VITAL SIGNS: ED Triage Vitals  Enc Vitals Group     BP 02/05/18 1150 (!) 157/98     Pulse Rate 02/05/18 1150 93     Resp 02/05/18 1150 16     Temp 02/05/18 1150 98.4 F (36.9 C)     Temp Source 02/05/18 1150 Oral     SpO2 02/05/18 1150 98 %     Weight 02/05/18 1151 141 lb 15.6 oz (64.4 kg)     Height 02/05/18 1151 5\' 6"  (1.676 m)     Head Circumference --      Peak Flow --      Pain Score 02/05/18 1151 5     Pain Loc --      Pain Edu?  --      Excl. in GC? --     Constitutional: Alert and oriented. Well appearing and in no apparent distress. HEENT:      Head: Normocephalic and atraumatic.         Eyes: Conjunctivae are normal. Sclera is non-icteric.       Mouth/Throat: Mucous membranes are moist.       Neck: Supple with no signs of meningismus. Cardiovascular: Regular rate and rhythm. No murmurs, gallops, or rubs. 2+ symmetrical distal pulses are present in all extremities. No JVD. Respiratory: Normal respiratory effort. Lungs are clear to auscultation bilaterally. No wheezes, crackles, or rhonchi.  Gastrointestinal: Soft, mild diffuse tenderness however patient is focally more tender to palpation in the right lower quadrant, and non distended with positive bowel sounds. No rebound or guarding. Musculoskeletal: Nontender with normal range of motion  in all extremities. No edema, cyanosis, or erythema of extremities. Neurologic: Normal speech and language. Face is symmetric. Moving all extremities. No gross focal neurologic deficits are appreciated. Skin: Skin is warm, dry and intact. No rash noted. Psychiatric: Mood and affect are normal. Speech and behavior are normal.  ____________________________________________   LABS (all labs ordered are listed, but only abnormal results are displayed)  Labs Reviewed  COMPREHENSIVE METABOLIC PANEL - Abnormal; Notable for the following components:      Result Value   Total Protein 8.4 (*)    All other components within normal limits  CBC - Abnormal; Notable for the following components:   RDW 16.4 (*)    All other components within normal limits  URINALYSIS, COMPLETE (UACMP) WITH MICROSCOPIC - Abnormal; Notable for the following components:   Color, Urine YELLOW (*)    APPearance CLEAR (*)    All other components within normal limits  LIPASE, BLOOD   ____________________________________________  EKG  none  ____________________________________________  RADIOLOGY  I  have personally reviewed the images performed during this visit and I agree with the Radiologist's read.   Interpretation by Radiologist:  No results found.    ____________________________________________   PROCEDURES  Procedure(s) performed: None Procedures Critical Care performed:  None ____________________________________________   INITIAL IMPRESSION / ASSESSMENT AND PLAN / ED COURSE   62 y.o. female with a history of IBS, hypertension, depression, anxiety who presents for evaluation of abdominal pain, nausea and vomiting.  Patient reports that the symptoms have been ongoing for 4 weeks since being started on simvastatin and lisinopril.  Ddx medication side effect, appendicitis, IBS flair. At this time my biggest concern would be a side effect of simvastatin.  However patient reports worsening symptoms over the last few days and she seems to be focally more tender on the right lower quadrant therefore we will get a CT abdomen pelvis to rule out appendicitis.  Her labs are all within normal limits.  UA is negative.  If CT is negative for any acute findings will give patient a prescription for Zofran, discontinue simvastatin until she has an appointment with her PCP in a week to see if her symptoms improved.  She continues to have symptoms she will have to see a GI doctor.  Clinical Course as of Feb 05 1530  Tue Feb 05, 2018  1525 CT tech came to get patient and patient refused CT. Patient then asked to speak with me and said that she has passed a lot of gas and the pain is gone. She also feels like the nausea has resolved. I explained to her my concerns about her being more tender on the right lower quadrant and a missed or delayed diagnosis of appendicitis could lead to perforation, sepsis, and increased morbidity and mortality rate.  Patient understand these complications.  On reevaluation her abdomen is now soft and she is no longer tender on the right lower quadrant and with normal labs  my suspicion for appendicitis is low but not 0 at this time.  I did cancel the CT scan and I recommended that she follows up with her doctor in 24 hours for recheck or return to the emergency room if the pain in the right lower quadrant recurs, she develops a fever, or if she continues to have nausea and vomiting at home.  Patient reports feeling markedly improved after IV Zofran and fluids.  She was not given any medication for pain in the emergency room.    [CV]  Clinical Course User Index [CV] Don Perking [place], MD     As part of my medical decision making, I reviewed the following data within the electronic MEDICAL RECORD NUMBER Nursing notes reviewed and incorporated, Labs reviewed , Old chart reviewed, Notes from prior ED visits and [place] Controlled Substance Database    Pertinent labs & imaging results that were available during my care of the patient were reviewed by me and considered in my medical decision making (see chart for details).    ____________________________________________   FINAL CLINICAL IMPRESSION(S) / ED DIAGNOSES  Final diagnoses:  Nausea vomiting and diarrhea  Abdominal pain, unspecified abdominal location      NEW MEDICATIONS STARTED DURING THIS VISIT:  ED Discharge Orders         Ordered    ondansetron (ZOFRAN ODT) 4 MG disintegrating tablet  Every 8 hours PRN     02/05/18 1530           Note:  This document was prepared using Dragon voice recognition software and may include unintentional dictation errors.    Don Perking, [place], MD 02/05/18 [id number]

## 2018-05-22 DIAGNOSIS — M5442 Lumbago with sciatica, left side: Secondary | ICD-10-CM | POA: Insufficient documentation

## 2018-05-22 DIAGNOSIS — J302 Other seasonal allergic rhinitis: Secondary | ICD-10-CM | POA: Insufficient documentation

## 2018-05-22 DIAGNOSIS — G8929 Other chronic pain: Secondary | ICD-10-CM | POA: Insufficient documentation

## 2018-05-22 DIAGNOSIS — K580 Irritable bowel syndrome with diarrhea: Secondary | ICD-10-CM | POA: Insufficient documentation

## 2018-06-05 ENCOUNTER — Other Ambulatory Visit: Payer: Self-pay | Admitting: Family Medicine

## 2018-06-05 DIAGNOSIS — G8929 Other chronic pain: Secondary | ICD-10-CM

## 2018-06-05 DIAGNOSIS — M5442 Lumbago with sciatica, left side: Principal | ICD-10-CM

## 2018-06-17 ENCOUNTER — Ambulatory Visit: Admission: RE | Admit: 2018-06-17 | Payer: BLUE CROSS/BLUE SHIELD | Source: Ambulatory Visit

## 2018-07-14 ENCOUNTER — Other Ambulatory Visit: Payer: Self-pay

## 2018-07-14 ENCOUNTER — Emergency Department
Admission: EM | Admit: 2018-07-14 | Discharge: 2018-07-14 | Disposition: A | Discharge status: Home / Self Care | Payer: BLUE CROSS/BLUE SHIELD | Attending: Emergency Medicine | Admitting: Emergency Medicine

## 2018-07-14 ENCOUNTER — Emergency Department: Payer: BLUE CROSS/BLUE SHIELD

## 2018-07-14 DIAGNOSIS — I10 Essential (primary) hypertension: Secondary | ICD-10-CM | POA: Insufficient documentation

## 2018-07-14 DIAGNOSIS — R079 Chest pain, unspecified: Secondary | ICD-10-CM | POA: Diagnosis present

## 2018-07-14 DIAGNOSIS — F17210 Nicotine dependence, cigarettes, uncomplicated: Secondary | ICD-10-CM | POA: Insufficient documentation

## 2018-07-14 DIAGNOSIS — M25512 Pain in left shoulder: Secondary | ICD-10-CM | POA: Diagnosis not present

## 2018-07-14 DIAGNOSIS — G8929 Other chronic pain: Secondary | ICD-10-CM | POA: Insufficient documentation

## 2018-07-14 LAB — CBC
HCT: 41.3 % (ref 36.0–46.0)
Hemoglobin: 13.3 g/dL (ref 12.0–15.0)
MCH: 28.2 pg (ref 26.0–34.0)
MCHC: 32.2 g/dL (ref 30.0–36.0)
MCV: 87.7 fL (ref 80.0–100.0)
NRBC: 0 % (ref 0.0–0.2)
PLATELETS: 426 10*3/uL — AB (ref 150–400)
RBC: 4.71 MIL/uL (ref 3.87–5.11)
RDW: 15 % (ref 11.5–15.5)
WBC: 9.6 10*3/uL (ref 4.0–10.5)

## 2018-07-14 LAB — BASIC METABOLIC PANEL
ANION GAP: 9 (ref 5–15)
BUN: 15 mg/dL (ref 6–20)
CALCIUM: 9.3 mg/dL (ref 8.9–10.3)
CO2: 21 mmol/L — ABNORMAL LOW (ref 22–32)
Chloride: 109 mmol/L (ref 98–111)
Creatinine, Ser: 0.49 mg/dL (ref 0.44–1.00)
Glucose, Bld: 101 mg/dL — ABNORMAL HIGH (ref 70–99)
Potassium: 3.9 mmol/L (ref 3.5–5.1)
Sodium: 139 mmol/L (ref 135–145)

## 2018-07-14 LAB — TROPONIN I

## 2018-07-14 MED ORDER — OXYCODONE-ACETAMINOPHEN 5-325 MG PO TABS
2.0000 | ORAL_TABLET | Freq: Once | ORAL | Status: AC
  Administered 2018-07-14: 2 via ORAL
  Filled 2018-07-14: qty 2

## 2018-07-14 MED ORDER — TRAMADOL HCL 50 MG PO TABS: 50.0000 mg | ORAL_TABLET | Freq: Four times a day (QID) | ORAL | 0 refills | Status: DC | PRN

## 2018-07-14 MED ORDER — SODIUM CHLORIDE 0.9% FLUSH: 3.0000 mL | Freq: Once | INTRAVENOUS | Status: DC

## 2018-07-14 NOTE — ED Provider Notes (Signed)
[facility] Emergency Department Provider Note       Time seen: ----------------------------------------- 2:25 PM on 07/14/2018 -----------------------------------------   I have reviewed the triage vital signs and the nursing notes.  HISTORY   Chief Complaint Chest Pain; Headache; and Hypertension    HPI Katrina Holmes is a 63 y.o. female with a history of allergies, anxiety, depression, hypertension, IBS who presents to the ED for chest pain that is left-sided and radiates into the left arm.  She also reports headache and high blood pressure.  She states she takes her blood pressure medicine and already took her dose today.  She states she has had some blurry vision.  Past Medical History:  Diagnosis Date  . Allergy   . Anxiety   . Depression   . Hypertension   . IBS (irritable bowel syndrome) 15 years  . Suicide attempt [facility])     Patient Active Problem List   Diagnosis Date Noted  . Opioid use disorder, moderate, dependence (HCC) 01/15/2017  . Sedative, hypnotic or anxiolytic use disorder, severe, dependence (HCC) 01/15/2017  . Tobacco use disorder 01/12/2017  . HTN (hypertension) 01/11/2017  . Severe recurrent major depression without psychotic features (HCC) 12/29/2015  . DDD (degenerative disc disease), cervical 05/25/2015  . 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 DDD (degenerative disc disease), lumbar 05/25/2015    Past Surgical History:  Procedure Laterality Date  . ABDOMINAL HYSTERECTOMY    . NECK SURGERY  2006  . SHOULDER ARTHROSCOPY WITH ROTATOR CUFF REPAIR Left    3 surgeries (2006-2007-2008)    Allergies Gabapentin; Nsaids; Trazodone and nefazodone; and Penicillins  Social History Social History   Tobacco Use  . Smoking status: Current Every Day Smoker    Packs/day: 0.50    Types: Cigarettes  . Smokeless tobacco: Never Used  Substance Use Topics  . Alcohol use: No  . Drug use: No   Review of Systems Constitutional:  Negative for fever. Cardiovascular: Positive for chest pain Respiratory: Negative for shortness of breath. Gastrointestinal: Negative for abdominal pain, vomiting and diarrhea. Genitourinary: Negative for dysuria. Musculoskeletal: Positive for chronic left shoulder pain Skin: Negative for rash. Neurological: Positive for headache  All systems negative/normal/unremarkable except as stated in the HPI  ____________________________________________   PHYSICAL EXAM:  VITAL SIGNS: ED Triage Vitals [07/14/18 1348]  Enc Vitals Group     BP (!) 175/76     Pulse Rate 92     Resp 18     Temp (!) 97.5 F (36.4 C)     Temp Source Oral     SpO2 97 %     Weight      Height      Head Circumference      Peak Flow      Pain Score 8     Pain Loc      Pain Edu?      Excl. in GC?    Constitutional: Alert and oriented. Well appearing and in no distress. Eyes: Conjunctivae are normal. Normal extraocular movements. ENT      Head: Normocephalic and atraumatic.      Nose: No congestion/rhinnorhea.      Mouth/Throat: Mucous membranes are moist.      Neck: No stridor. Cardiovascular: Normal rate, regular rhythm. No murmurs, rubs, or gallops. Respiratory: Normal respiratory effort without tachypnea nor retractions. Breath sounds are clear and equal bilaterally. No wheezes/rales/rhonchi. Gastrointestinal: Soft and nontender. Normal bowel sounds Musculoskeletal: Pain with range of motion of the left shoulder.  Neurologic:  Normal speech and language. No gross focal neurologic deficits are appreciated.  Skin:  Skin is warm, dry and intact. No rash noted. Psychiatric: Mood and affect are normal. Speech and behavior are normal.  ____________________________________________  EKG: Interpreted by me.  Sinus rhythm the rate 84 bpm, normal PR interval, normal QRS, normal QT  ____________________________________________  ED COURSE:  As part of my medical decision making, I reviewed the following data  within the electronic MEDICAL RECORD NUMBER History obtained from family if available, nursing notes, old chart and ekg, as well as notes from prior ED visits. Patient presented for shoulder pain and concerns about her blood pressure, we will assess with labs and imaging as indicated at this time.   Procedures ____________________________________________   LABS (pertinent positives/negatives)  Labs Reviewed  BASIC METABOLIC PANEL - Abnormal; Notable for the following components:      Result Value   CO2 21 (*)    Glucose, Bld 101 (*)    All other components within normal limits  CBC - Abnormal; Notable for the following components:   Platelets 426 (*)    All other components within normal limits  TROPONIN I    RADIOLOGY Chest x-ray  IMPRESSION: No acute abnormality. Mild changes of COPD and chronic bronchitis. ____________________________________________   DIFFERENTIAL DIAGNOSIS   Chronic pain, arthritis, rotator cuff injury, hypertensive urgency, unstable angina unlikely  FINAL ASSESSMENT AND PLAN  Shoulder pain, hypertension   Plan: The patient had presented for left-sided shoulder pain and hypertension. Patient's labs are unremarkable. Patient's imaging not reveal any acute process.  This is likely shoulder pain related to chronic left shoulder issues.  She has had 3 rotator cuff repairs.  She is cleared for outpatient follow-up.   Ulice DashJohnathan E Hawk Mones, MD    Note: This note was generated in part or whole with voice recognition software. Voice recognition is usually quite accurate but there are transcription errors that can and very often do occur. I apologize for any typographical errors that were not detected and corrected.     Emily FilbertWilliams, Jabria Loos E, MD 07/14/18 [phone]

## 2018-07-14 NOTE — ED Triage Notes (Signed)
Pt comes via POV from home with c/o chest pain that is left sided and radiates to left arm. Pt states headache and high BP as well.  Pt states she takes BP medication and took her daily dose today. Pt states all day her BP has been fluctuating.  Pt states some blurred vision.  VAN negative and neuro.

## 2018-07-14 NOTE — ED Notes (Signed)
Patient transported to X-ray 

## 2018-07-16 ENCOUNTER — Ambulatory Visit: Payer: Self-pay | Admitting: Nurse Practitioner

## 2018-08-19 ENCOUNTER — Ambulatory Visit: Payer: Self-pay | Admitting: Nurse Practitioner

## 2018-09-03 ENCOUNTER — Emergency Department
Admission: EM | Admit: 2018-09-03 | Discharge: 2018-09-03 | Disposition: A | Discharge status: Home / Self Care | Payer: BLUE CROSS/BLUE SHIELD | Attending: Emergency Medicine | Admitting: Emergency Medicine

## 2018-09-03 ENCOUNTER — Emergency Department: Payer: BLUE CROSS/BLUE SHIELD

## 2018-09-03 ENCOUNTER — Other Ambulatory Visit: Payer: Self-pay

## 2018-09-03 DIAGNOSIS — W010XXA Fall on same level from slipping, tripping and stumbling without subsequent striking against object, initial encounter: Secondary | ICD-10-CM | POA: Diagnosis not present

## 2018-09-03 DIAGNOSIS — F17210 Nicotine dependence, cigarettes, uncomplicated: Secondary | ICD-10-CM | POA: Insufficient documentation

## 2018-09-03 DIAGNOSIS — Z79899 Other long term (current) drug therapy: Secondary | ICD-10-CM | POA: Diagnosis not present

## 2018-09-03 DIAGNOSIS — Z7982 Long term (current) use of aspirin: Secondary | ICD-10-CM | POA: Diagnosis not present

## 2018-09-03 DIAGNOSIS — Y998 Other external cause status: Secondary | ICD-10-CM | POA: Insufficient documentation

## 2018-09-03 DIAGNOSIS — W19XXXA Unspecified fall, initial encounter: Secondary | ICD-10-CM

## 2018-09-03 DIAGNOSIS — M542 Cervicalgia: Secondary | ICD-10-CM | POA: Diagnosis not present

## 2018-09-03 DIAGNOSIS — Y92009 Unspecified place in unspecified non-institutional (private) residence as the place of occurrence of the external cause: Secondary | ICD-10-CM | POA: Diagnosis not present

## 2018-09-03 DIAGNOSIS — M25512 Pain in left shoulder: Secondary | ICD-10-CM | POA: Diagnosis present

## 2018-09-03 DIAGNOSIS — Y9301 Activity, walking, marching and hiking: Secondary | ICD-10-CM | POA: Insufficient documentation

## 2018-09-03 DIAGNOSIS — I10 Essential (primary) hypertension: Secondary | ICD-10-CM | POA: Diagnosis not present

## 2018-09-03 MED ORDER — OXYCODONE HCL 5 MG PO TABS: 5.0000 mg | ORAL_TABLET | Freq: Four times a day (QID) | ORAL | 0 refills | Status: DC | PRN

## 2018-09-03 MED ORDER — OXYCODONE-ACETAMINOPHEN 5-325 MG PO TABS
1.0000 | ORAL_TABLET | Freq: Once | ORAL | Status: AC
  Administered 2018-09-03: 1 via ORAL
  Filled 2018-09-03: qty 1

## 2018-09-03 NOTE — ED Notes (Signed)
Report received from [place], [place], care of pt assumed, NAD noted, awaiting disposition at this time.  Will monitor.

## 2018-09-03 NOTE — ED Provider Notes (Signed)
[facility] Emergency Department Provider Note  ____________________________________________  Time seen: Approximately 10:29 AM  I have reviewed the triage vital signs and the nursing notes.   HISTORY  Chief Complaint Fall    HPI Katrina Holmes is a 63 y.o. female with history of left rotator cuff surgery x3 and cervical spine fusion remotely presenting with left-sided neck pain and left shoulder pain after a fall 4 days ago.  The patient reports that on Saturday she was walking towards her bathroom and tripped on a rug, landing on her left shoulder.  She did not lose consciousness and was able to get up afterwards.  Since then, she has tried Tylenol, ibuprofen although conservatively with a history of gastritis, heat, ice packs, icy hot, without any significant improvement.  She is not having any numbness tingling or weakness.  He has not had any associated chest pain, shortness of breath or palpitations, lightheadedness or syncope.  Past Medical History:  Diagnosis Date  . Allergy   . Anxiety   . Depression   . Hypertension   . IBS (irritable bowel syndrome) 15 years  . Suicide attempt [facility])     Patient Active Problem List   Diagnosis Date Noted  . Opioid use disorder, moderate, dependence (HCC) 01/15/2017  . Sedative, hypnotic or anxiolytic use disorder, severe, dependence (HCC) 01/15/2017  . Tobacco use disorder 01/12/2017  . HTN (hypertension) 01/11/2017  . Severe recurrent major depression without psychotic features (HCC) 12/29/2015  . DDD (degenerative disc disease), cervical 05/25/2015  . 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 DDD (degenerative disc disease), lumbar 05/25/2015    Past Surgical History:  Procedure Laterality Date  . ABDOMINAL HYSTERECTOMY    . NECK SURGERY  2006  . SHOULDER ARTHROSCOPY WITH ROTATOR CUFF REPAIR Left    3 surgeries (2006-2007-2008)    Current Outpatient Rx  . Order #: [id number] Class: Historical Med  . Order #:  [id number] Class: Historical Med  . Order #: [id number] Class: Historical Med  . Order #: [id number] Class: Historical Med  . Order #: [id number] Class: Historical Med  . Order #: [id number] Class: Historical Med  . Order #: [id number] Class: Historical Med  . Order #: [id number] Class: Historical Med  . Order #: [id number] Class: Print    Allergies Peanut oil; Gabapentin; Nsaids; Trazodone and nefazodone; and Penicillins  Family History  Problem Relation Age of Onset  . Heart disease Mother   . Stroke Father     Social History Social History   Tobacco Use  . Smoking status: Current Every Day Smoker    Packs/day: 0.50    Types: Cigarettes  . Smokeless tobacco: Never Used  Substance Use Topics  . Alcohol use: No  . Drug use: No    Review of Systems Constitutional: No fever/chills.  No lightheadedness or syncope.  Positive mechanical fall. Eyes: No visual changes.  No blurred or double vision. ENT: No sore throat. No congestion or rhinorrhea. Cardiovascular: Denies chest pain. Denies palpitations. Respiratory: Denies shortness of breath.  No cough. Gastrointestinal: No abdominal pain.  No nausea, no vomiting.  No diarrhea.  No constipation. Genitourinary: Negative for dysuria. Musculoskeletal: Positive for left-sided neck pain.  Positive for left shoulder pain.  No mid or lower back pain. Skin: Negative for rash. Neurological: Negative for headaches. No focal numbness, tingling or weakness.     ____________________________________________   PHYSICAL EXAM:  VITAL SIGNS: ED Triage Vitals [09/03/18 0940]  Enc Vitals Group     BP (!) 172/93     Pulse Rate  99     Resp 16     Temp 98.2 F (36.8 C)     Temp src      SpO2 99 %     Weight 140 lb (63.5 kg)     Height 5\' 6"  (1.676 m)     Head Circumference      Peak Flow      Pain Score 8     Pain Loc      Pain Edu?      Excl. in GC?     Constitutional: Alert and oriented. Answers questions appropriately.  Chronically  ill-appearing. Eyes: Conjunctivae are normal.  EOMI. No scleral icterus. Head: Atraumatic. Nose: No congestion/rhinnorhea. Mouth/Throat: Mucous membranes are moist.  Neck: No stridor.  Supple.  Minimal diffuse midline tenderness to palpation in the C-spine.  The patient does have more tenderness to palpation to the left of the midline.  No palpable step-offs or deformities. Cardiovascular: Normal rate, regular rhythm. No murmurs, rubs or gallops.  Respiratory: Normal respiratory effort.  No accessory muscle use or retractions. Lungs CTAB.  No wheezes, rales or ronchi. Musculoskeletal: Pelvis is stable.  The patient has full range of motion of the left wrist and elbow without pain.  She has full range of motion with discomfort in the left shoulder.  No clinical evidence of dislocation.  She has no tenderness to palpation over the left clavicle.  Mild tenderness to palpation over the left AC joint.  Normal left radial pulse.  5 out of 5 grip strength on the left.  No midline T or L-spine tenderness to palpation, step-offs or deformities.  Normal gait without ataxia. Neurologic:  A&Ox3.  Speech is clear.  Face and smile are symmetric.  EOMI.  Moves all extremities well. Skin:  Skin is warm, dry and intact. No rash noted. Psychiatric: Mood and affect are normal. Speech and behavior are normal.  Normal judgement  ____________________________________________   LABS (all labs ordered are listed, but only abnormal results are displayed)  Labs Reviewed - No data to display ____________________________________________  EKG  Not indicated ____________________________________________  RADIOLOGY  Dg Cervical Spine Complete  Result Date: 09/03/2018 CLINICAL DATA:  Neck pain with radiation to left shoulder. Prior fall. EXAM: CERVICAL SPINE - COMPLETE 4+ VIEW COMPARISON:  CT 07/14/2012. FINDINGS: C4-C5 and C5-C6 interbody fusion. Anatomic alignment. Hardware intact. No acute bony abnormality. No  evidence of fracture dislocation. Soft tissues are unremarkable. Carotid vascular disease. IMPRESSION: C4-C5 and C5-C6 interbody fusion. Anatomic alignment. Hardware intact. No acute bony abnormality. Electronically Signed   By: Maisie Fushomas  Register   On: 09/03/2018 10:54   Dg Shoulder Left  Result Date: 09/03/2018 CLINICAL DATA:  Left shoulder pain after fall EXAM: LEFT SHOULDER - 2+ VIEW COMPARISON:  MRI 03/22/2006 FINDINGS: Negative for acute fracture or dislocation. Postoperative acromion and distal clavicle. Rotator cuff anchors. IMPRESSION: 1. No acute finding. 2. Postoperative rotator cuff and AC joint. Electronically Signed   By: Marnee SpringJonathon  Watts M.D.   On: 09/03/2018 10:10    ____________________________________________   PROCEDURES  Procedure(s) performed: None  Procedures  Critical Care performed: No ____________________________________________   INITIAL IMPRESSION / ASSESSMENT AND PLAN / ED COURSE  Pertinent labs & imaging results that were available during my care of the patient were reviewed by me and considered in my medical decision making (see chart for details).  63 y.o. female with a prior history of rotator cuff surgery as well as cervical spine fusion presenting with pain in the neck, more  on the left side, and left shoulder pain after a fall 4 days ago.  Overall, the patient is hemodynamically stable.  Her imaging does not suggest any acute bony injury in the neck or the left shoulder or clavicle.  I have encouraged the patient to make an appointment with her orthopedist at [facility] to be reevaluated for rotator cuff injury.  She will be given a sling here and has been given specific instructions about range of motion to prevent frozen shoulder.  Her C-spine x-ray does not show any abnormalities, and she is not having any neurologic deficits.  I have encouraged her to continue ice and heat,, Tylenol, Motrin with food and in small doses, and I will send her home with a  prescription for several tablets of oxycodone to use for severe breakthrough pain.  Follow-up instructions as well as return precautions were discussed.  ____________________________________________  FINAL CLINICAL IMPRESSION(S) / ED DIAGNOSES  Final diagnoses:  Fall, initial encounter  Acute pain of left shoulder  Neck pain on left side         NEW MEDICATIONS STARTED DURING THIS VISIT:  New Prescriptions   OXYCODONE (ROXICODONE) 5 MG IMMEDIATE RELEASE TABLET    Take 1 tablet (5 mg total) by mouth every 6 (six) hours as needed for severe pain.      Rockne Menghini, MD 09/03/18 [phone]

## 2018-09-03 NOTE — Discharge Instructions (Signed)
You may continue to use heat or ice, whichever works better, for 15 minutes every 2 hours on your neck and shoulder to decrease pain.  May continue Tylenol for pain.  Advil can help your pain as well, but make sure that you take lower doses such as 400 mg, and take it with food to decrease stomach irritation.  Oxycodone is for severe breakthrough pain.  Do not drive within 8 hours of taking oxycodone.  Please make a follow-up appointment with your orthopedist for reevaluation of your left shoulder pain.  You may wear the sling for comfort but make sure you are doing full range of your motion of your shoulder several times per hour to prevent frozen shoulder.  Return to the emergency department if you develop severe pain, lightheadedness or fainting, or any other symptoms concerning to you.

## 2018-09-03 NOTE — ED Triage Notes (Signed)
Pt arrived via POV for a fall that took place Saturday. Pt landed on left shoulder and c/o of neck pain.

## 2018-11-12 DIAGNOSIS — Z716 Tobacco abuse counseling: Secondary | ICD-10-CM | POA: Insufficient documentation

## 2018-11-21 DIAGNOSIS — G5630 Lesion of radial nerve, unspecified upper limb: Secondary | ICD-10-CM | POA: Insufficient documentation

## 2019-01-05 ENCOUNTER — Other Ambulatory Visit: Payer: Self-pay

## 2019-01-05 ENCOUNTER — Encounter: Payer: Self-pay | Admitting: Emergency Medicine

## 2019-01-05 ENCOUNTER — Emergency Department
Admission: EM | Admit: 2019-01-05 | Discharge: 2019-01-05 | Disposition: A | Discharge status: Home / Self Care | Payer: BLUE CROSS/BLUE SHIELD | Attending: Emergency Medicine | Admitting: Emergency Medicine

## 2019-01-05 DIAGNOSIS — Z7982 Long term (current) use of aspirin: Secondary | ICD-10-CM | POA: Insufficient documentation

## 2019-01-05 DIAGNOSIS — Z880 Allergy status to penicillin: Secondary | ICD-10-CM | POA: Diagnosis not present

## 2019-01-05 DIAGNOSIS — M26621 Arthralgia of right temporomandibular joint: Secondary | ICD-10-CM | POA: Diagnosis not present

## 2019-01-05 DIAGNOSIS — I10 Essential (primary) hypertension: Secondary | ICD-10-CM | POA: Diagnosis not present

## 2019-01-05 DIAGNOSIS — Z79899 Other long term (current) drug therapy: Secondary | ICD-10-CM | POA: Diagnosis not present

## 2019-01-05 DIAGNOSIS — F17210 Nicotine dependence, cigarettes, uncomplicated: Secondary | ICD-10-CM | POA: Diagnosis not present

## 2019-01-05 DIAGNOSIS — R6884 Jaw pain: Secondary | ICD-10-CM | POA: Diagnosis present

## 2019-01-05 MED ORDER — HYDROCODONE-ACETAMINOPHEN 5-325 MG PO TABS: 1.0000 | ORAL_TABLET | Freq: Four times a day (QID) | ORAL | 0 refills | Status: DC | PRN

## 2019-01-05 MED ORDER — HYDROCODONE-ACETAMINOPHEN 5-325 MG PO TABS
1.0000 | ORAL_TABLET | ORAL | Status: AC
  Administered 2019-01-05: 1 via ORAL
  Filled 2019-01-05: qty 1

## 2019-01-05 MED ORDER — PREDNISONE 10 MG PO TABS: 10.0000 mg | ORAL_TABLET | Freq: Every day | ORAL | 0 refills | Status: DC

## 2019-01-05 MED ORDER — PREDNISONE 20 MG PO TABS
60.0000 mg | ORAL_TABLET | Freq: Once | ORAL | Status: AC
  Administered 2019-01-05: 60 mg via ORAL
  Filled 2019-01-05: qty 3

## 2019-01-05 NOTE — Discharge Instructions (Signed)
Please eat a soft diet.  Take prednisone as prescribed and Norco as needed for severe pain only.  Follow-up with ENT specialist next week.  Return to the ER for any fevers increasing pain worsening swelling urgent changes in health.

## 2019-01-05 NOTE — ED Provider Notes (Signed)
[facility] EMERGENCY DEPARTMENT Provider Note   CSN: [id number] Arrival date & time: 01/05/19  1356     History   Chief Complaint Chief Complaint  Patient presents with  . Jaw Pain    HPI Katrina Holmes is a 63 y.o. female presents to the emergency department for evaluation of right jaw pain.  Symptoms been present for 3 days.  She has a history of intermittent jaw pain due to fractures and surgery to the right jaw.  She denies any recent trauma or injury.  No fevers warmth redness swelling or drainage.  She has been taking over-the-counter Tylenol.  She thinks possibly ribs could have irritated her jaw.  Pain is at the right TMJ.  No ear pain, headaches or rashes.  No vision changes.     HPI  Past Medical History:  Diagnosis Date  . Allergy   . Anxiety   . Depression   . Hypertension   . IBS (irritable bowel syndrome) 15 years  . Suicide attempt [facility](HCC)     Patient Active Problem List   Diagnosis Date Noted  . Opioid use disorder, moderate, dependence (HCC) 01/15/2017  . Sedative, hypnotic or anxiolytic use disorder, severe, dependence (HCC) 01/15/2017  . Tobacco use disorder 01/12/2017  . HTN (hypertension) 01/11/2017  . Severe recurrent major depression without psychotic features (HCC) 12/29/2015  . DDD (degenerative disc disease), cervical 05/25/2015  . 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 DDD (degenerative disc disease), lumbar 05/25/2015    Past Surgical History:  Procedure Laterality Date  . ABDOMINAL HYSTERECTOMY    . NECK SURGERY  2006  . SHOULDER ARTHROSCOPY WITH ROTATOR CUFF REPAIR Left    3 surgeries (2006-2007-2008)     OB History    Gravida  3   Para      Term      Preterm      AB      Living  4     SAB      TAB      Ectopic      Multiple      Live Births               Home Medications    Prior to Admission medications   Medication Sig Start Date End Date Taking? Authorizing Provider  aspirin EC 81  MG tablet Take 81 mg by mouth daily.    [provider]  dicyclomine (BENTYL) 20 MG tablet Take 20 mg by mouth every 6 (six) hours as needed for spasms. 08/12/18 08/13/19  [provider]  FLUoxetine (PROZAC) 40 MG capsule Take 40 mg by mouth at bedtime. 08/16/18   [provider]  fluticasone (FLONASE) 50 MCG/ACT nasal spray Place 1 spray into both nostrils daily. 09/16/17   [provider]  HYDROcodone-acetaminophen (NORCO) 5-325 MG tablet Take 1 tablet by mouth every 6 (six) hours as needed for moderate pain. 01/05/19   Evon SlackGaines, Thomas C, PA-C  lisinopril (PRINIVIL,ZESTRIL) 5 MG tablet Take 5 mg by mouth daily. 10/17/17   [provider]  loratadine (CLARITIN) 10 MG tablet Take 10 mg by mouth daily.    [provider]  OLANZapine (ZYPREXA) 5 MG tablet Take 5 mg by mouth at bedtime. 08/16/18   [provider]  omeprazole (PRILOSEC) 20 MG capsule Take 20 mg by mouth daily. 10/14/17   [provider]  oxyCODONE (ROXICODONE) 5 MG immediate release tablet Take 1 tablet (5 mg total) by mouth every 6 (six)  hours as needed for severe pain. 09/03/18 09/03/19  Eula Listen, MD  predniSONE (DELTASONE) 10 MG tablet Take 1 tablet (10 mg total) by mouth daily. 6,5,4,3,2,1 six day taper 01/05/19   Duanne Guess, PA-C    Family History Family History  Problem Relation Age of Onset  . Heart disease Mother   . Stroke Father     Social History Social History   Tobacco Use  . Smoking status: Current Every Day Smoker    Packs/day: 0.50    Types: Cigarettes  . Smokeless tobacco: Never Used  Substance Use Topics  . Alcohol use: No  . Drug use: No     Allergies   Peanut oil, Gabapentin, Nsaids, Trazodone and nefazodone, and Penicillins   Review of Systems Review of Systems  Constitutional: Negative for fever.  HENT: Negative for ear discharge, ear pain, facial swelling, sore throat and trouble swallowing.   Eyes: Negative for  redness.  Respiratory: Negative for shortness of breath.   Cardiovascular: Negative for chest pain.  Skin: Negative for rash.  Neurological: Negative for dizziness and headaches.     Physical Exam Updated Vital Signs BP (!) 175/70 (BP Location: Left Arm)   Pulse 77   Temp 98.8 F (37.1 C) (Oral)   Resp 18   Ht 5\' 6"  (1.676 m)   Wt 63.5 kg   SpO2 97%   BMI 22.60 kg/m   Physical Exam Constitutional:      Appearance: She is well-developed.  HENT:     Head: Normocephalic and atraumatic.     Comments: Right ear exam is normal.  Normal canal and normal TM no rashes noted.  Tender along the TMJ pain with opening and closing.  Nontender throughout the mandible.  No swelling warmth or redness.  No intraoral abscess formation. Eyes:     Conjunctiva/sclera: Conjunctivae normal.  Neck:     Musculoskeletal: Normal range of motion.  Cardiovascular:     Rate and Rhythm: Normal rate.  Pulmonary:     Effort: Pulmonary effort is normal. No respiratory distress.  Musculoskeletal: Normal range of motion.  Skin:    General: Skin is warm.     Findings: No rash.  Neurological:     General: No focal deficit present.     Mental Status: She is alert and oriented to person, place, and time.     Cranial Nerves: No cranial nerve deficit.  Psychiatric:        Behavior: Behavior normal.        Thought Content: Thought content normal.      ED Treatments / Results  Labs (all labs ordered are listed, but only abnormal results are displayed) Labs Reviewed - No data to display  EKG None  Radiology No results found.  Procedures Procedures (including critical care time)  Medications Ordered in ED Medications  HYDROcodone-acetaminophen (NORCO/VICODIN) 5-325 MG per tablet 1 tablet (has no administration in time range)  predniSONE (DELTASONE) tablet 60 mg (has no administration in time range)     Initial Impression / Assessment and Plan / ED Course  I have reviewed the triage vital  signs and the nursing notes.  Pertinent labs & imaging results that were available during my care of the patient were reviewed by me and considered in my medical decision making (see chart for details).       63 year old female with history of right jaw pain due to trauma, fracture, surgery.  Patient's pain is been recently flared up.  She will  be started on a 6-day steroid taper and given Norco.  She will eat a soft diet.  She has an appointment with ENT specialist in 10 days.  She understands signs and symptoms return to ED for.  Vital signs are stable no signs of infection or abscess formation nor shingles on exam.  Final Clinical Impressions(s) / ED Diagnoses   Final diagnoses:  Arthralgia of right temporomandibular joint    ED Discharge Orders         Ordered    predniSONE (DELTASONE) 10 MG tablet  Daily     01/05/19 1747    HYDROcodone-acetaminophen (NORCO) 5-325 MG tablet  Every 6 hours PRN     01/05/19 1747           Evon SlackGaines, Thomas C, PA-C 01/05/19 1751    Arnaldo NatalMalinda, Paul F, MD 01/05/19 1858

## 2019-01-05 NOTE — ED Triage Notes (Signed)
Pt to ED with c/o of jaw that started 3 days ago. Pt has hx of jaw injury. Pt has surgery consult appt planned 7/29 but states unable to tolerate pain.

## 2019-01-27 ENCOUNTER — Encounter: Payer: Self-pay | Admitting: Psychiatry

## 2019-01-27 ENCOUNTER — Ambulatory Visit (INDEPENDENT_AMBULATORY_CARE_PROVIDER_SITE_OTHER): Payer: BLUE CROSS/BLUE SHIELD | Admitting: Psychiatry

## 2019-01-27 ENCOUNTER — Other Ambulatory Visit: Payer: Self-pay

## 2019-01-27 DIAGNOSIS — F3160 Bipolar disorder, current episode mixed, unspecified: Secondary | ICD-10-CM | POA: Diagnosis not present

## 2019-01-27 DIAGNOSIS — F17200 Nicotine dependence, unspecified, uncomplicated: Secondary | ICD-10-CM | POA: Diagnosis not present

## 2019-01-27 DIAGNOSIS — F411 Generalized anxiety disorder: Secondary | ICD-10-CM

## 2019-01-27 DIAGNOSIS — F4310 Post-traumatic stress disorder, unspecified: Secondary | ICD-10-CM

## 2019-01-27 MED ORDER — HYDROXYZINE PAMOATE 25 MG PO CAPS: 25.0000 mg | ORAL_CAPSULE | Freq: Three times a day (TID) | ORAL | 1 refills | Status: DC | PRN

## 2019-01-27 MED ORDER — CLONAZEPAM 0.5 MG PO TABS: 0.2500 mg | ORAL_TABLET | ORAL | 0 refills | Status: DC

## 2019-01-27 MED ORDER — OLANZAPINE 7.5 MG PO TABS: 7.5000 mg | ORAL_TABLET | Freq: Every day | ORAL | 0 refills | Status: DC

## 2019-01-27 MED ORDER — FLUOXETINE HCL 40 MG PO CAPS: 40.0000 mg | ORAL_CAPSULE | Freq: Every day | ORAL | 0 refills | Status: DC
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
## 2019-02-06 ENCOUNTER — Other Ambulatory Visit: Payer: Self-pay

## 2019-02-06 ENCOUNTER — Encounter: Payer: Self-pay | Admitting: Licensed Clinical Social Worker

## 2019-02-06 ENCOUNTER — Ambulatory Visit (INDEPENDENT_AMBULATORY_CARE_PROVIDER_SITE_OTHER): Payer: BLUE CROSS/BLUE SHIELD | Admitting: Licensed Clinical Social Worker

## 2019-02-06 DIAGNOSIS — F4310 Post-traumatic stress disorder, unspecified: Secondary | ICD-10-CM

## 2019-02-06 DIAGNOSIS — F411 Generalized anxiety disorder: Secondary | ICD-10-CM

## 2019-02-06 DIAGNOSIS — F3160 Bipolar disorder, current episode mixed, unspecified: Secondary | ICD-10-CM
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
## 2019-02-17 ENCOUNTER — Ambulatory Visit (INDEPENDENT_AMBULATORY_CARE_PROVIDER_SITE_OTHER): Payer: BLUE CROSS/BLUE SHIELD | Admitting: Psychiatry

## 2019-02-17 ENCOUNTER — Encounter: Payer: Self-pay | Admitting: Psychiatry

## 2019-02-17 ENCOUNTER — Other Ambulatory Visit: Payer: Self-pay

## 2019-02-17 DIAGNOSIS — F3160 Bipolar disorder, current episode mixed, unspecified: Secondary | ICD-10-CM

## 2019-02-17 DIAGNOSIS — F411 Generalized anxiety disorder: Secondary | ICD-10-CM | POA: Diagnosis not present

## 2019-02-17 DIAGNOSIS — F4310 Post-traumatic stress disorder, unspecified: Secondary | ICD-10-CM

## 2019-02-17 MED ORDER — OXCARBAZEPINE 150 MG PO TABS: 150.0000 mg | ORAL_TABLET | Freq: Two times a day (BID) | ORAL | 1 refills | Status: DC

## 2019-02-17 MED ORDER — HYDROXYZINE PAMOATE 50 MG PO CAPS: 50.0000 mg | ORAL_CAPSULE | Freq: Two times a day (BID) | ORAL | 1 refills | Status: DC | PRN

## 2019-02-17 NOTE — Progress Notes (Signed)
Virtual Visit via Video Note  I connected with Katrina Holmes on 02/17/19 at 11:45 AM EDT by a video enabled telemedicine application and verified that I am speaking with the correct person using two identifiers.   I discussed the limitations of evaluation and management by telemedicine and the availability of in person appointments. The patient expressed understanding and agreed to proceed.   I discussed the assessment and treatment plan with the patient. The patient was provided an opportunity to ask questions and all were answered. The patient agreed with the plan and demonstrated an understanding of the instructions.   The patient was advised to call back or seek an in-person evaluation if the symptoms worsen or if the condition fails to improve as anticipated.   BH MD OP Progress Note  02/17/2019 5:33 PM Katrina Holmes  MRN:  [id number]  Chief Complaint:  Chief Complaint    Follow-up     HPI: Katrina Holmes is a 63 year old Caucasian female, married, lives in [place], has a history of bipolar disorder, PTSD, GAD, hypertension, IBS, gastroesophageal reflux disease was evaluated by telemedicine today.  Patient today reports she continues to struggle with anxiety and feeling on edge.  She denies any significant mood swings.  She reports sleep is improved.  She continues to be in pain which also makes her anxious.  She reports she has been seeing her pain provider.  She has not taken any clonazepam since her last visit with Clinical research associate.  She has been taking hydroxyzine as needed for anxiety attacks.  She however reports hydroxyzine at this dosage is not very helpful.  Patient denies any suicidality, homicidality or perceptual disturbances.  Patient denies any other concerns today. Visit Diagnosis:    ICD-10-CM   1. Mixed bipolar I disorder (HCC)  F31.60 OXcarbazepine (TRILEPTAL) 150 MG tablet    hydrOXYzine (VISTARIL) 50 MG capsule   mild   2. PTSD (post-traumatic stress disorder)   F43.10 OXcarbazepine (TRILEPTAL) 150 MG tablet    hydrOXYzine (VISTARIL) 50 MG capsule  3. GAD (generalized anxiety disorder)  F41.1 OXcarbazepine (TRILEPTAL) 150 MG tablet    hydrOXYzine (VISTARIL) 50 MG capsule    Past Psychiatric History: I have reviewed past psychiatric history from my progress note on 01/27/2019.  Past trials of BuSpar-gave her side effects, Zyprexa, clonazepam, Prozac.  Past Medical History:  Past Medical History:  Diagnosis Date  . Allergy   . Anxiety   . Depression   . Hypertension   . IBS (irritable bowel syndrome) 15 years  . PTSD (post-traumatic stress disorder)   . Suicide attempt [facility])     Past Surgical History:  Procedure Laterality Date  . ABDOMINAL HYSTERECTOMY    . NECK SURGERY  2006  . SHOULDER ARTHROSCOPY WITH ROTATOR CUFF REPAIR Left    3 surgeries (2006-2007-2008)    Family Psychiatric History: I have reviewed family psychiatric history from my progress note on 01/27/2019.  Family History:  Family History  Problem Relation Age of Onset  . Heart disease Mother   . Stroke Father     Social History: I have reviewed social history from my progress note on 01/27/2019. Social History   Socioeconomic History  . Marital status: Married    Spouse name: robert  . Number of children: 4  . Years of education: Not on file  . Highest education level: Not on file  Occupational History  . Not on file  Social Needs  . Financial resource strain: Not hard at all  . Food  insecurity    Worry: Never true    Inability: Never true  . Transportation needs    Medical: No    Non-medical: No  Tobacco Use  . Smoking status: Current Every Day Smoker    Packs/day: 0.50    Types: Cigarettes  . Smokeless tobacco: Never Used  Substance and Sexual Activity  . Alcohol use: No  . Drug use: No  . Sexual activity: Not on file  Lifestyle  . Physical activity    Days per week: 0 days    Minutes per session: 0 min  . Stress: Not on file  Relationships  .  Social Herbalist on phone: Not on file    Gets together: Not on file    Attends religious service: Never    Active member of club or organization: No    Attends meetings of clubs or organizations: Never    Relationship status: Married  Other Topics Concern  . Not on file  Social History Narrative  . Not on file    Allergies:  Allergies  Allergen Reactions  . Peanut Oil     Admitted for anaphylaxis on 01/11/17 after reporting exposure to peanut butter per outside records.  . Gabapentin     Bloating   . Nsaids Nausea And Vomiting  . Trazodone And Nefazodone Diarrhea    And hallucinations   . Penicillins Rash    .Has patient had a PCN reaction causing immediate rash, facial/tongue/throat swelling, SOB or lightheadedness with hypotension: Unknown Has patient had a PCN reaction causing severe rash involving mucus membranes or skin necrosis: Unknown Has patient had a PCN reaction that required hospitalization: Unknown Has patient had a PCN reaction occurring within the last 10 years: Unknown If all of the above answers are "NO", then may proceed with Cephalosporin use.     Metabolic Disorder Labs: Lab Results  Component Value Date   HGBA1C 5.5 01/13/2017   MPG 111 01/13/2017   No results found for: PROLACTIN Lab Results  Component Value Date   CHOL 237 (H) 01/13/2017   TRIG 269 (H) 01/13/2017   HDL 52 01/13/2017   CHOLHDL 4.6 01/13/2017   VLDL 54 (H) 01/13/2017   LDLCALC 131 (H) 01/13/2017   Lab Results  Component Value Date   TSH 3.540 01/13/2017    Therapeutic Level Labs: No results found for: LITHIUM No results found for: VALPROATE No components found for:  CBMZ  Current Medications: Current Outpatient Medications  Medication Sig Dispense Refill  . albuterol (VENTOLIN HFA) 108 (90 Base) MCG/ACT inhaler Inhale into the lungs.    Marland Kitchen aspirin EC 81 MG tablet Take 81 mg by mouth daily.    . clonazePAM (KLONOPIN) 0.5 MG tablet TK 1 T PO ONCE A DAY FOR  PANIC    . clonazePAM (KLONOPIN) 0.5 MG tablet Take 0.5-1 tablets (0.25-0.5 mg total) by mouth as directed. Once a day as needed for severe panic attacks only 30 tablet 0  . dicyclomine (BENTYL) 20 MG tablet Take 20 mg by mouth every 6 (six) hours as needed for spasms.    Marland Kitchen FLUoxetine (PROZAC) 40 MG capsule Take 1 capsule (40 mg total) by mouth daily with breakfast. 90 capsule 0  . fluticasone (FLONASE) 50 MCG/ACT nasal spray Place 1 spray into both nostrils daily.  1  . hydrOXYzine (VISTARIL) 50 MG capsule Take 1 capsule (50 mg total) by mouth 2 (two) times daily as needed for anxiety. For anxiety attacks 60 capsule 1  .  lisinopril (PRINIVIL,ZESTRIL) 5 MG tablet Take 5 mg by mouth daily.  2  . loratadine (CLARITIN) 10 MG tablet Take 10 mg by mouth daily.    Marland Kitchen. OLANZapine (ZYPREXA) 7.5 MG tablet Take 1 tablet (7.5 mg total) by mouth at bedtime. 90 tablet 0  . omeprazole (PRILOSEC) 20 MG capsule Take 20 mg by mouth daily.  0  . ondansetron (ZOFRAN) 4 MG tablet Take by mouth.    . OXcarbazepine (TRILEPTAL) 150 MG tablet Take 1 tablet (150 mg total) by mouth 2 (two) times daily. 60 tablet 1   No current facility-administered medications for this visit.      Musculoskeletal: Strength & Muscle Tone: UTA Gait & Station: normal Patient leans: N/A  Psychiatric Specialty Exam: Review of Systems  Psychiatric/Behavioral: The patient is nervous/anxious.   All other systems reviewed and are negative.   There were no vitals taken for this visit.There is no height or weight on file to calculate BMI.  General Appearance: Casual  Eye Contact:  Fair  Speech:  Clear and Coherent  Volume:  Normal  Mood:  Anxious  Affect:  Appropriate  Thought Process:  Goal Directed and Descriptions of Associations: Intact  Orientation:  Full (Time, Place, and Person)  Thought Content: Logical   Suicidal Thoughts:  No  Homicidal Thoughts:  No  Memory:  Immediate;   Fair Recent;   Fair Remote;   Fair  Judgement:   Fair  Insight:  Fair  Psychomotor Activity:  Normal  Concentration:  Concentration: Fair and Attention Span: Fair  Recall:  FiservFair  Fund of Knowledge: Fair  Language: Fair  Akathisia:  No  Handed:  Right  AIMS (if indicated):Denies tremors, rigidity  Assets:  Communication Skills Desire for Improvement Social Support  ADL's:  Intact  Cognition: WNL  Sleep:  Fair   Screenings: AIMS     [facility]  AIMS Total Score  0    AUDIT     [facility]  Alcohol Use Disorder Identification Test Final Score (AUDIT)  0    PHQ2-9     [facility] Clinical [facility]  PHQ-2 Total Score  0  0  0  5  PHQ-9 Total Score  -  -  -  10       Assessment and Plan: Lupita LeashDonna is a 63 year old Caucasian female, married, unemployed, [place], has a history of bipolar disorder, PTSD, panic attacks, GAD was evaluated by telemedicine today.  She is biologically predisposed given her history of trauma, chronic mental health problems, health issues.  She also has psychosocial stressors of current pandemic. Patient continues to struggle with anxiety and will benefit from medication readjustment.  Plan Bipolar disorder- some progress Zyprexa 7.5 mg p.o. daily Add Trileptal 150 mg p.o. twice daily Prozac 40 mg p.o. daily.  Patient was advised to take it in the morning with breakfast She is completely off of the clonazepam.   PTSD- some progress Increase hydroxyzine to 50 mg p.o. twice daily as needed for anxiety attacks Prozac as prescribed Patient was referred for  psychotherapy sessions with therapist here in clinic  GAD- unstable Prozac as prescribed Increase hydroxyzine to 50  mg p.o. twice daily as needed  Tobacco use disorder-improving Provided smoking cessation counseling.  Follow-up in clinic in 3 to 4 weeks or sooner if needed.  September 29 at 3 PM  I have spent atleast 15 minutes non face to face with patient today. More than 50 % of the time was spent for psychoeducation and supportive psychotherapy and care coordination. This note was generated in part or whole with voice recognition software. Voice recognition is usually quite accurate but there are transcription errors that can and very often do occur. I apologize for any typographical errors that were not detected and corrected.      Jomarie LongsSaramma Abhishek Levesque, MD 02/17/2019, 5:33 PM

## 2019-02-19 ENCOUNTER — Ambulatory Visit: Payer: BLUE CROSS/BLUE SHIELD | Admitting: Psychiatry

## 2019-03-03 ENCOUNTER — Other Ambulatory Visit: Payer: Self-pay

## 2019-03-03 ENCOUNTER — Telehealth: Payer: Self-pay

## 2019-03-03 ENCOUNTER — Encounter: Payer: Self-pay | Admitting: Licensed Clinical Social Worker

## 2019-03-03 ENCOUNTER — Ambulatory Visit (INDEPENDENT_AMBULATORY_CARE_PROVIDER_SITE_OTHER): Payer: BLUE CROSS/BLUE SHIELD | Admitting: Licensed Clinical Social Worker

## 2019-03-03 DIAGNOSIS — F4310 Post-traumatic stress disorder, unspecified: Secondary | ICD-10-CM | POA: Diagnosis not present

## 2019-03-03 DIAGNOSIS — F3160 Bipolar disorder, current episode mixed, unspecified: Secondary | ICD-10-CM | POA: Diagnosis not present

## 2019-03-03 NOTE — Addendum Note (Signed)
Addended byUrsula Alert on: 03/03/2019 05:05 PM   Modules accepted: Orders
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
## 2019-03-03 NOTE — Telephone Encounter (Signed)
Patient called regarding her Oxcarbazepine 150mg . She stated that it's causing stomach issues and nausea. Please review and advise. Thank you.

## 2019-03-03 NOTE — Telephone Encounter (Signed)
Advised patient to stop Trileptal since she is having side effects. Will discuss new medications at her next visit.

## 2019-03-09 ENCOUNTER — Other Ambulatory Visit: Payer: Self-pay

## 2019-03-09 ENCOUNTER — Emergency Department: Payer: BLUE CROSS/BLUE SHIELD

## 2019-03-09 ENCOUNTER — Emergency Department
Admission: EM | Admit: 2019-03-09 | Discharge: 2019-03-09 | Disposition: A | Discharge status: Home / Self Care | Payer: BLUE CROSS/BLUE SHIELD | Attending: Emergency Medicine | Admitting: Emergency Medicine

## 2019-03-09 ENCOUNTER — Encounter: Payer: Self-pay | Admitting: Emergency Medicine

## 2019-03-09 DIAGNOSIS — Z7982 Long term (current) use of aspirin: Secondary | ICD-10-CM | POA: Diagnosis not present

## 2019-03-09 DIAGNOSIS — E871 Hypo-osmolality and hyponatremia: Secondary | ICD-10-CM | POA: Insufficient documentation

## 2019-03-09 DIAGNOSIS — Z91010 Allergy to peanuts: Secondary | ICD-10-CM | POA: Insufficient documentation

## 2019-03-09 DIAGNOSIS — R110 Nausea: Secondary | ICD-10-CM | POA: Diagnosis not present

## 2019-03-09 DIAGNOSIS — F17210 Nicotine dependence, cigarettes, uncomplicated: Secondary | ICD-10-CM | POA: Diagnosis not present

## 2019-03-09 DIAGNOSIS — Z79899 Other long term (current) drug therapy: Secondary | ICD-10-CM | POA: Diagnosis not present

## 2019-03-09 DIAGNOSIS — I10 Essential (primary) hypertension: Secondary | ICD-10-CM | POA: Diagnosis not present

## 2019-03-09 LAB — COMPREHENSIVE METABOLIC PANEL
ALT: 13 U/L (ref 0–44)
AST: 24 U/L (ref 15–41)
Albumin: 4.5 g/dL (ref 3.5–5.0)
Alkaline Phosphatase: 59 U/L (ref 38–126)
Anion gap: 12 (ref 5–15)
BUN: 7 mg/dL — ABNORMAL LOW (ref 8–23)
CO2: 21 mmol/L — ABNORMAL LOW (ref 22–32)
Calcium: 9.7 mg/dL (ref 8.9–10.3)
Chloride: 93 mmol/L — ABNORMAL LOW (ref 98–111)
Creatinine, Ser: 0.63 mg/dL (ref 0.44–1.00)
GFR calc Af Amer: >60 mL/min (ref >60)
GFR calc non Af Amer: >60 mL/min (ref >60)
Glucose, Bld: 102 mg/dL — ABNORMAL HIGH (ref 70–99)
Potassium: 3.7 mmol/L (ref 3.5–5.1)
Sodium: 126 mmol/L — ABNORMAL LOW (ref 135–145)
Total Bilirubin: 0.5 mg/dL (ref 0.3–1.2)
Total Protein: 8.1 g/dL (ref 6.5–8.1)

## 2019-03-09 LAB — URINALYSIS, COMPLETE (UACMP) WITH MICROSCOPIC
Bacteria, UA: NONE SEEN
Bilirubin Urine: NEGATIVE
Glucose, UA: NEGATIVE mg/dL
Hgb urine dipstick: NEGATIVE
Ketones, ur: NEGATIVE mg/dL
Leukocytes,Ua: NEGATIVE
Nitrite: NEGATIVE
Protein, ur: NEGATIVE mg/dL
Specific Gravity, Urine: 1.01 (ref 1.005–1.030)
Squamous Epithelial / HPF: NONE SEEN (ref 0–5)
WBC, UA: NONE SEEN WBC/hpf (ref 0–5)
pH: 5 (ref 5.0–8.0)

## 2019-03-09 LAB — CBC
HCT: 39.1 % (ref 36.0–46.0)
Hemoglobin: 13.3 g/dL (ref 12.0–15.0)
MCH: 27.9 pg (ref 26.0–34.0)
MCHC: 34 g/dL (ref 30.0–36.0)
MCV: 82 fL (ref 80.0–100.0)
Platelets: 373 10*3/uL (ref 150–400)
RBC: 4.77 MIL/uL (ref 3.87–5.11)
RDW: 14.3 % (ref 11.5–15.5)
WBC: 10.7 10*3/uL — ABNORMAL HIGH (ref 4.0–10.5)
nRBC: 0 % (ref 0.0–0.2)

## 2019-03-09 LAB — LIPASE, BLOOD: Lipase: 37 U/L (ref 11–51)

## 2019-03-09 MED ORDER — SODIUM CHLORIDE 0.9 % IV BOLUS
1000.0000 mL | Freq: Once | INTRAVENOUS | Status: AC
  Administered 2019-03-09: 1000 mL via INTRAVENOUS

## 2019-03-09 MED ORDER — ACETAMINOPHEN 500 MG PO TABS
ORAL_TABLET | ORAL | Status: AC
  Filled 2019-03-09: qty 2

## 2019-03-09 MED ORDER — ONDANSETRON 4 MG PO TBDP: 4.0000 mg | ORAL_TABLET | Freq: Three times a day (TID) | ORAL | 0 refills | Status: DC | PRN

## 2019-03-09 MED ORDER — SODIUM CHLORIDE 1 G PO TABS: 1.0000 g | ORAL_TABLET | Freq: Three times a day (TID) | ORAL | 0 refills | Status: DC

## 2019-03-09 MED ORDER — IOHEXOL 9 MG/ML PO SOLN
500.0000 mL | Freq: Two times a day (BID) | ORAL | Status: DC | PRN
  Administered 2019-03-09 (×2): 500 mL via ORAL
  Filled 2019-03-09 (×2): qty 500

## 2019-03-09 MED ORDER — ONDANSETRON HCL 4 MG/2ML IJ SOLN
4.0000 mg | Freq: Once | INTRAMUSCULAR | Status: AC
  Administered 2019-03-09: 18:00:00 4 mg via INTRAVENOUS
  Filled 2019-03-09: qty 2

## 2019-03-09 MED ORDER — IOHEXOL 300 MG/ML  SOLN: 100.0000 mL | Freq: Once | INTRAMUSCULAR | Status: DC | PRN

## 2019-03-09 NOTE — ED Notes (Signed)
Pt c/o nausea with abdominal pain x 2 weeks. Pt stated she has been taking OTC Zofran w/ no relief. Pt states she has pain in mid/left lower quadrant.

## 2019-03-09 NOTE — ED Notes (Signed)
Pt states last dose of Lisinopril was yesterday.

## 2019-03-09 NOTE — ED Notes (Signed)
Pt has consumed 1.5 bottles of ct contrast and states she cannot drink anymore. Ct notified.

## 2019-03-09 NOTE — ED Triage Notes (Signed)
Pt arrived via POV with reports of nausea and abdominal pain. Pt states she has been having nausea x 2 weeks, pt states she has been having abdominal pain off and on for "quite sometime".  Pt also states she has an allergic reaction to lisinopril for the past year where she has swelling to her throat and tongue.  No swelling noted to tongue at this time. Pt states she treats the swelling with ice water and flonase.  Pt states she has taken 4 doses of zofran today.

## 2019-03-09 NOTE — ED Provider Notes (Signed)
[facility] Emergency Department Provider Note  Time seen: 5:38 PM  I have reviewed the triage vital signs and the nursing notes.   HISTORY  Chief Complaint Nausea and Abdominal Pain   HPI Katrina Holmes is a 63 y.o. female with a past medical history of anxiety, depression, hypertension, presents to the emergency department for nausea.  According to the patient over the past 2 weeks she has been extremely nauseated unable to eat or drink much of anything.  States she is also been experiencing moderate aching pain in her left lower quadrant.  Denies any vomiting or diarrhea but stays constantly nauseated per patient.  Denies any fever denies any cough congestion or shortness of breath.   Past Medical History:  Diagnosis Date  . Allergy   . Anxiety   . Depression   . Hypertension   . IBS (irritable bowel syndrome) 15 years  . PTSD (post-traumatic stress disorder)   . Suicide attempt [facility](HCC)     Patient Active Problem List   Diagnosis Date Noted  . Mixed bipolar I disorder (HCC) 01/27/2019  . PTSD (post-traumatic stress disorder) 01/27/2019  . GAD (generalized anxiety disorder) 01/27/2019  . Radial nerve palsy, unspecified laterality 11/21/2018  . Encounter for tobacco use cessation counseling 11/12/2018  . Chronic midline low back pain with left-sided sciatica 05/22/2018  . Irritable bowel syndrome with diarrhea 05/22/2018  . Seasonal allergies 05/22/2018  . Anxiety 08/01/2017  . Cervical postlaminectomy syndrome 08/01/2017  . Lumbar facet arthropathy 08/01/2017  . Opioid use disorder, moderate, dependence (HCC) 01/15/2017  . Sedative, hypnotic or anxiolytic use disorder, severe, dependence (HCC) 01/15/2017  . Tobacco use disorder 01/12/2017  . HTN (hypertension) 01/11/2017  . Severe recurrent major depression without psychotic features (HCC) 12/29/2015  . DDD (degenerative disc disease), cervical 05/25/2015  . 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 DDD  (degenerative disc disease), lumbar 05/25/2015  . Major depressive disorder 10/17/2013  . Gastroesophageal reflux disease without esophagitis 10/03/2012  . Hyperlipidemia 10/03/2012    Past Surgical History:  Procedure Laterality Date  . ABDOMINAL HYSTERECTOMY    . NECK SURGERY  2006  . SHOULDER ARTHROSCOPY WITH ROTATOR CUFF REPAIR Left    3 surgeries (2006-2007-2008)    Prior to Admission medications   Medication Sig Start Date End Date Taking? Authorizing Provider  albuterol (VENTOLIN HFA) 108 (90 Base) MCG/ACT inhaler Inhale into the lungs. 01/10/19 01/10/20  [provider]  aspirin EC 81 MG tablet Take 81 mg by mouth daily.    [provider]  clonazePAM (KLONOPIN) 0.5 MG tablet TK 1 T PO ONCE A DAY FOR PANIC 01/13/19   [provider]  clonazePAM (KLONOPIN) 0.5 MG tablet Take 0.5-1 tablets (0.25-0.5 mg total) by mouth as directed. Once a day as needed for severe panic attacks only 02/11/19   Jomarie LongsEappen, Saramma, MD  dicyclomine (BENTYL) 20 MG tablet Take 20 mg by mouth every 6 (six) hours as needed for spasms. 08/12/18 08/13/19  [provider]  FLUoxetine (PROZAC) 40 MG capsule Take 1 capsule (40 mg total) by mouth daily with breakfast. 01/27/19   Jomarie LongsEappen, Saramma, MD  fluticasone (FLONASE) 50 MCG/ACT nasal spray Place 1 spray into both nostrils daily. 09/16/17   [provider]  hydrOXYzine (VISTARIL) 50 MG capsule Take 1 capsule (50 mg total) by mouth 2 (two) times daily as needed for anxiety. For anxiety attacks 02/17/19   Jomarie LongsEappen, Saramma, MD  lisinopril (PRINIVIL,ZESTRIL) 5 MG tablet Take 5 mg by mouth daily.  10/17/17   [provider]  loratadine (CLARITIN) 10 MG tablet Take 10 mg by mouth daily.    [provider]  OLANZapine (ZYPREXA) 7.5 MG tablet Take 1 tablet (7.5 mg total) by mouth at bedtime. 01/27/19   Ursula Alert, MD  omeprazole (PRILOSEC) 20 MG capsule Take 20 mg by mouth daily. 10/14/17   [provider]   ondansetron (ZOFRAN) 4 MG tablet Take by mouth. 11/12/18 11/12/19  [provider]    Allergies  Allergen Reactions  . Peanut Oil     Admitted for anaphylaxis on 01/11/17 after reporting exposure to peanut butter per outside records.  . Gabapentin     Bloating   . Nsaids Nausea And Vomiting  . Trazodone And Nefazodone Diarrhea    And hallucinations   . Penicillins Rash    .Has patient had a PCN reaction causing immediate rash, facial/tongue/throat swelling, SOB or lightheadedness with hypotension: Unknown Has patient had a PCN reaction causing severe rash involving mucus membranes or skin necrosis: Unknown Has patient had a PCN reaction that required hospitalization: Unknown Has patient had a PCN reaction occurring within the last 10 years: Unknown If all of the above answers are "NO", then may proceed with Cephalosporin use.     Family History  Problem Relation Age of Onset  . Heart disease Mother   . Stroke Father     Social History Social History   Tobacco Use  . Smoking status: Current Every Day Smoker    Packs/day: 0.50    Types: Cigarettes  . Smokeless tobacco: Never Used  Substance Use Topics  . Alcohol use: No  . Drug use: No    Review of Systems Constitutional: Negative for fever. Cardiovascular: Negative for chest pain. Respiratory: Negative for shortness of breath. Gastrointestinal: Moderate left lower quadrant abdominal pain.  Positive for nausea but negative for vomiting or diarrhea. Genitourinary: Negative for urinary compaints Musculoskeletal: Negative for musculoskeletal complaints Neurological: Negative for headache All other ROS negative  ____________________________________________   PHYSICAL EXAM:  VITAL SIGNS: ED Triage Vitals  Enc Vitals Group     BP 03/09/19 1532 (!) 199/83     Pulse Rate 03/09/19 1532 80     Resp 03/09/19 1532 18     Temp 03/09/19 1532 98.9 F (37.2 C)     Temp Source 03/09/19 1532 Oral     SpO2 03/09/19  1532 99 %     Weight 03/09/19 1533 135 lb (61.2 kg)     Height 03/09/19 1533 5\' 6"  (1.676 m)     Head Circumference --      Peak Flow --      Pain Score 03/09/19 1533 6     Pain Loc --      Pain Edu? --      Excl. in [place]? --    Constitutional: Alert and oriented. Well appearing and in no distress. Eyes: Normal exam ENT      Head: Normocephalic and atraumatic.      Mouth/Throat: Mucous membranes are moist. Cardiovascular: Normal rate, regular rhythm.  Respiratory: Normal respiratory effort without tachypnea nor retractions. Breath sounds are clear  Gastrointestinal: Soft and nontender. No distention.  Musculoskeletal: Nontender with normal range of motion in all extremities. Neurologic:  Normal speech and language. No gross focal neurologic deficits  Skin:  Skin is warm, dry and intact.  Psychiatric: Mood and affect are normal.  ____________________________________________    EKG  EKG viewed and interpreted by myself shows a normal sinus  rhythm 81 bpm with a narrow QRS, normal axis, normal intervals, no concerning ST changes.  ____________________________________________   INITIAL IMPRESSION / ASSESSMENT AND PLAN / ED COURSE  Pertinent labs & imaging results that were available during my care of the patient were reviewed by me and considered in my medical decision making (see chart for details).   Patient presents to the emergency department for persistent and constant nausea over the past 2 weeks as well as left lower quadrant abdominal pain.  Differential would include colitis, diverticulitis, UTI or pyelonephritis.  We will check labs, CT scan dose Zofran and fluids and continue to closely monitor.  Patient's labs have resulted showing fairly significant hyponatremia at 126.  Baseline appears to be in the upper 130s.  Patient denies any new medications denies any dietary changes besides not eating or drinking as much due to nausea.  CT imaging is pending we will continue to  push IV fluids.  Source of hyponatremia is unclear at this time.  Patient is now refusing CT.  States she feels worlds better.  Is very hungry wants to leave and eat.  Denies any nausea at this time.  I offered to admit the patient given her hyponatremia however patient is made it very clear that she wishes to be discharged.  We will place the patient on salt tablets for the next 1 week and have the patient follow-up with her doctor this week for recheck of her labs.  I discussed return precautions.  Patient agreeable.  Katrina Holmes was evaluated in Emergency Department on 03/09/2019 for the symptoms described in the history of present illness. She was evaluated in the context of the global COVID-19 pandemic, which necessitated consideration that the patient might be at risk for infection with the SARS-CoV-2 virus that causes COVID-19. Institutional protocols and algorithms that pertain to the evaluation of patients at risk for COVID-19 are in a state of rapid change based on information released by regulatory bodies including the CDC and federal and state organizations. These policies and algorithms were followed during the patient's care in the ED.  ____________________________________________   FINAL CLINICAL IMPRESSION(S) / ED DIAGNOSES  Nausea Hyponatremia   Minna Antis, MD 03/09/19 [phone]

## 2019-03-09 NOTE — ED Notes (Signed)
Provided pt with 2nd bottle of omnipaque after verifying with CT.

## 2019-03-09 NOTE — ED Notes (Addendum)
Pt states she does not think she needs the CT scan and she is feeling better. MD notified.

## 2019-03-09 NOTE — Discharge Instructions (Signed)
Please follow-up with your doctor this coming week for recheck of your labs including your sodium level.  Your sodium level today was 126.  Please take your salt tablets as prescribed.  Take your nausea medication as needed, as written.  Return to the emergency department for any significant nausea, significant weakness, or any other symptom personally concerning to yourself.

## 2019-03-09 NOTE — ED Notes (Signed)
md in to speak to pt.

## 2019-03-10 ENCOUNTER — Telehealth: Payer: Self-pay

## 2019-03-10 NOTE — Telephone Encounter (Signed)
pt called states she was at the hospital over the weekend for being hot and sweaty per the hospital the hydroxyzine was causing and she was told to stop

## 2019-03-10 NOTE — Telephone Encounter (Signed)
Called patient , discussed that we will give it more time prior to starting another new medication.

## 2019-03-18 ENCOUNTER — Ambulatory Visit: Payer: BLUE CROSS/BLUE SHIELD | Admitting: Psychiatry

## 2019-04-02 ENCOUNTER — Ambulatory Visit: Payer: BLUE CROSS/BLUE SHIELD | Admitting: Licensed Clinical Social Worker

## 2019-04-09 ENCOUNTER — Emergency Department
Admission: EM | Admit: 2019-04-09 | Discharge: 2019-04-09 | Disposition: A | Discharge status: Home / Self Care | Payer: BLUE CROSS/BLUE SHIELD | Attending: Emergency Medicine | Admitting: Emergency Medicine

## 2019-04-09 ENCOUNTER — Other Ambulatory Visit: Payer: Self-pay

## 2019-04-09 ENCOUNTER — Encounter: Payer: Self-pay | Admitting: Emergency Medicine

## 2019-04-09 DIAGNOSIS — G8929 Other chronic pain: Secondary | ICD-10-CM

## 2019-04-09 DIAGNOSIS — Z7982 Long term (current) use of aspirin: Secondary | ICD-10-CM | POA: Diagnosis not present

## 2019-04-09 DIAGNOSIS — I10 Essential (primary) hypertension: Secondary | ICD-10-CM | POA: Insufficient documentation

## 2019-04-09 DIAGNOSIS — Z91010 Allergy to peanuts: Secondary | ICD-10-CM | POA: Diagnosis not present

## 2019-04-09 DIAGNOSIS — Z79899 Other long term (current) drug therapy: Secondary | ICD-10-CM | POA: Diagnosis not present

## 2019-04-09 DIAGNOSIS — M5441 Lumbago with sciatica, right side: Secondary | ICD-10-CM | POA: Diagnosis not present

## 2019-04-09 DIAGNOSIS — F17210 Nicotine dependence, cigarettes, uncomplicated: Secondary | ICD-10-CM | POA: Diagnosis not present

## 2019-04-09 DIAGNOSIS — M549 Dorsalgia, unspecified: Secondary | ICD-10-CM | POA: Diagnosis present

## 2019-04-09 HISTORY — DX: Other intervertebral disc degeneration, lumbar region: M51.36

## 2019-04-09 HISTORY — DX: Other intervertebral disc degeneration, lumbar region without mention of lumbar back pain or lower extremity pain: M51.369

## 2019-04-09 HISTORY — DX: Sciatica, unspecified side: M54.30

## 2019-04-09 MED ORDER — TRAMADOL HCL 50 MG PO TABS: 50.0000 mg | ORAL_TABLET | Freq: Four times a day (QID) | ORAL | 0 refills | Status: DC | PRN

## 2019-04-09 MED ORDER — PREDNISONE 10 MG (21) PO TBPK: ORAL_TABLET | ORAL | 0 refills | Status: DC

## 2019-04-09 MED ORDER — TIZANIDINE HCL 4 MG PO TABS: 4.0000 mg | ORAL_TABLET | Freq: Three times a day (TID) | ORAL | 0 refills | Status: AC | PRN

## 2019-04-09 NOTE — ED Triage Notes (Signed)
Pt to ED from home c/o lower left back pain radiating to left leg.  Pt unable to get in with PCP.  Had MRI done in September at [facility]

## 2019-04-09 NOTE — ED Provider Notes (Signed)
[facility] Provider Note ____________________________________________  Time seen: Approximately 3:28 PM  I have reviewed the triage vital signs and the nursing notes.  HISTORY  Chief Complaint Back Pain and Leg Pain   HPI Katrina Holmes is a 63 y.o. female presents to the emergency department for treatment of chronic back pain. She has had an increase in pain for the past few weeks. She was evaluated by PCP earlier in the month and was advised that she would not be prescribed any controlled substances. She was given a referral to pain management. She has an appointment on Nov 5, but states pain is unbearable. No change in location or symptoms, just increase in pain without new injury. No relief with muscle rub cream and tylenol.  Past Medical History:  Diagnosis Date  . Allergy   . Anxiety   . Degenerative disc disease, lumbar   . Depression   . Hypertension   . IBS (irritable bowel syndrome) 15 years  . PTSD (post-traumatic stress disorder)   . Sciatica   . Suicide attempt [facility](HCC)     Patient Active Problem List   Diagnosis Date Noted  . Mixed bipolar I disorder (HCC) 01/27/2019  . PTSD (post-traumatic stress disorder) 01/27/2019  . GAD (generalized anxiety disorder) 01/27/2019  . Radial nerve palsy, unspecified laterality 11/21/2018  . Encounter for tobacco use cessation counseling 11/12/2018  . Chronic midline low back pain with left-sided sciatica 05/22/2018  . Irritable bowel syndrome with diarrhea 05/22/2018  . Seasonal allergies 05/22/2018  . Anxiety 08/01/2017  . Cervical postlaminectomy syndrome 08/01/2017  . Lumbar facet arthropathy 08/01/2017  . Opioid use disorder, moderate, dependence (HCC) 01/15/2017  . Sedative, hypnotic or anxiolytic use disorder, severe, dependence (HCC) 01/15/2017  . Tobacco use disorder 01/12/2017  . HTN (hypertension) 01/11/2017  . Severe recurrent major depression without psychotic  features (HCC) 12/29/2015  . DDD (degenerative disc disease), cervical 05/25/2015  . 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 DDD (degenerative disc disease), lumbar 05/25/2015  . Major depressive disorder 10/17/2013  . Gastroesophageal reflux disease without esophagitis 10/03/2012  . Hyperlipidemia 10/03/2012    Past Surgical History:  Procedure Laterality Date  . ABDOMINAL HYSTERECTOMY    . NECK SURGERY  2006  . SHOULDER ARTHROSCOPY WITH ROTATOR CUFF REPAIR Left    3 surgeries (2006-2007-2008)    Prior to Admission medications   Medication Sig Start Date End Date Taking? Authorizing Provider  albuterol (VENTOLIN HFA) 108 (90 Base) MCG/ACT inhaler Inhale into the lungs. 01/10/19 01/10/20  [provider]  aspirin EC 81 MG tablet Take 81 mg by mouth daily.    [provider]  clonazePAM (KLONOPIN) 0.5 MG tablet TK 1 T PO ONCE A DAY FOR PANIC 01/13/19   [provider]  clonazePAM (KLONOPIN) 0.5 MG tablet Take 0.5-1 tablets (0.25-0.5 mg total) by mouth as directed. Once a day as needed for severe panic attacks only 02/11/19   Jomarie LongsEappen, Saramma, MD  dicyclomine (BENTYL) 20 MG tablet Take 20 mg by mouth every 6 (six) hours as needed for spasms. 08/12/18 08/13/19  [provider]  FLUoxetine (PROZAC) 40 MG capsule Take 1 capsule (40 mg total) by mouth daily with breakfast. 01/27/19   Jomarie LongsEappen, Saramma, MD  fluticasone (FLONASE) 50 MCG/ACT nasal spray Place 1 spray into both nostrils daily. 09/16/17   [provider]  hydrOXYzine (VISTARIL) 50 MG capsule Take 1 capsule (50 mg total) by mouth 2 (two) times daily as needed for anxiety. For anxiety  attacks 02/17/19   Jomarie Longs, MD  lisinopril (PRINIVIL,ZESTRIL) 5 MG tablet Take 5 mg by mouth daily. 10/17/17   [provider]  loratadine (CLARITIN) 10 MG tablet Take 10 mg by mouth daily.    [provider]  OLANZapine (ZYPREXA) 7.5 MG tablet Take 1 tablet (7.5 mg total) by mouth at bedtime. 01/27/19    Jomarie Longs, MD  omeprazole (PRILOSEC) 20 MG capsule Take 20 mg by mouth daily. 10/14/17   [provider]  ondansetron (ZOFRAN ODT) 4 MG disintegrating tablet Take 1 tablet (4 mg total) by mouth every 8 (eight) hours as needed for nausea or vomiting. 03/09/19   Minna Antis, MD  ondansetron [facility]) 4 MG tablet Take by mouth. 11/12/18 11/12/19  [provider]  predniSONE (STERAPRED UNI-PAK 21 TAB) 10 MG (21) TBPK tablet Take 6 tablets on the first day and decrease by 1 tablet each day until finished. 04/09/19   Ahmeer Tuman B, FNP  sodium chloride 1 g tablet Take 1 tablet (1 g total) by mouth 3 (three) times daily with meals. 03/09/19   Minna Antis, MD  tiZANidine (ZANAFLEX) 4 MG tablet Take 1 tablet (4 mg total) by mouth every 8 (eight) hours as needed for muscle spasms. 04/09/19 05/09/19  Kylii Ennis, Kasandra Knudsen, FNP  traMADol (ULTRAM) 50 MG tablet Take 1 tablet (50 mg total) by mouth every 6 (six) hours as needed. 04/09/19   Copelyn Widmer, Rulon Eisenmenger B, FNP    Allergies Peanut oil, Gabapentin, Nsaids, Trazodone and nefazodone, and Penicillins  Family History  Problem Relation Age of Onset  . Heart disease Mother   . Stroke Father     Social History Social History   Tobacco Use  . Smoking status: Current Every Day Smoker    Packs/day: 0.50    Types: Cigarettes  . Smokeless tobacco: Never Used  Substance Use Topics  . Alcohol use: No  . Drug use: No    Review of Systems Constitutional: Well appearing. Respiratory: Negative for dyspnea. Cardiovascular: Negative for change in skin temperature or color. Musculoskeletal:   Negative for chronic steroid use   Negative for trauma in the presence of osteoporosis  Negative for age over 13 and trauma.  Negative for constitutional symptoms, or history of cancer   Negative for pain worse at night. Skin: Negative for rash, lesion, or wound.  Genitourinary: Negative for urinary retention. Rectal: Negative for fecal  incontinence or new onset constipation/bowel habit changes. Hematological/Immunilogical: Negative for immunosuppression, IV drug use, or fever Neurological: Positive for burning, tingling, numb, electric, radiating pain in the right and left lower extremity, greater on the right.                        Negative for saddle anesthesia.                        Negative for focal neurologic deficit, progressive or disabling symptoms             Negative for saddle anesthesia. ____________________________________________   PHYSICAL EXAM:  VITAL SIGNS: ED Triage Vitals  Enc Vitals Group     BP 04/09/19 1453 (!) 193/91     Pulse Rate 04/09/19 1453 85     Resp 04/09/19 1453 16     Temp 04/09/19 1453 98.3 F (36.8 C)     Temp Source 04/09/19 1453 Oral     SpO2 04/09/19 1453 99 %     Weight 04/09/19  1455 134 lb (60.8 kg)     Height 04/09/19 1455 5\' 6"  (1.676 m)     Head Circumference --      Peak Flow --      Pain Score 04/09/19 1455 8     Pain Loc --      Pain Edu? --      Excl. in [place]? --     Constitutional: Alert and oriented. Well appearing and in no acute distress. Eyes: Conjunctivae are clear without discharge or drainage.  Head: Atraumatic. Neck: Full, active range of motion. Respiratory: Respirations even and unlabored. Musculoskeletal: Guarded ROM of the back and extremities, Strength 5/5 of the lower extremities as tested. Neurologic: Reflexes of the lower extremities are 2+. Negative straight leg raise on the right and left side. Skin: Atraumatic.  Psychiatric: Behavior and affect are normal.  ____________________________________________   LABS (all labs ordered are listed, but only abnormal results are displayed)  Labs Reviewed - No data to display ____________________________________________  RADIOLOGY  Not indicated. ____________________________________________   PROCEDURES  Procedure(s) performed:   Procedures ____________________________________________   INITIAL IMPRESSION / ASSESSMENT AND PLAN / ED COURSE  Esraa Seres is a 63 y.o. female presenting to the emergency department for treatment of chronic back pain without new injury. MRI dated 03/11/2019 reviewed. It dos show multilevel lumbar degenerative disc disease, but no cord compression/stenosis/mass. She will be discharged home with 12 tablets of Tramadol, Zanaflex, and prednisone. She is to keep her scheduled appointment with pain management.   Medications - No data to display  ED Discharge Orders         Ordered    predniSONE (STERAPRED UNI-PAK 21 TAB) 10 MG (21) TBPK tablet     04/09/19 1554    traMADol (ULTRAM) 50 MG tablet  Every 6 hours PRN     04/09/19 1554    tiZANidine (ZANAFLEX) 4 MG tablet  Every 8 hours PRN     04/09/19 1554           Pertinent labs & imaging results that were available during my care of the patient were reviewed by me and considered in my medical decision making (see chart for details).  _________________________________________   FINAL CLINICAL IMPRESSION(S) / ED DIAGNOSES  Final diagnoses:  Chronic bilateral low back pain with right-sided sciatica     If controlled substance prescribed during this visit, 12 month history viewed on the [place] prior to issuing an initial prescription for Schedule II or III opiod.   Victorino Dike, FNP 04/09/19 Williemae Area, MD 04/09/19 2125

## 2019-04-23 ENCOUNTER — Encounter: Payer: Self-pay | Admitting: Student in an Organized Health Care Education/Training Program

## 2019-04-24 ENCOUNTER — Encounter: Payer: Self-pay | Admitting: Student in an Organized Health Care Education/Training Program

## 2019-04-24 ENCOUNTER — Other Ambulatory Visit: Payer: Self-pay

## 2019-04-24 ENCOUNTER — Ambulatory Visit
Payer: BLUE CROSS/BLUE SHIELD | Attending: Student in an Organized Health Care Education/Training Program | Admitting: Student in an Organized Health Care Education/Training Program

## 2019-04-24 VITALS — BP 175/95 | HR 102 | Temp 98.1°F | Resp 16 | Ht 66.0 in | Wt 139.0 lb

## 2019-04-24 DIAGNOSIS — M47816 Spondylosis without myelopathy or radiculopathy, lumbar region: Secondary | ICD-10-CM | POA: Diagnosis not present

## 2019-04-24 DIAGNOSIS — F1120 Opioid dependence, uncomplicated: Secondary | ICD-10-CM | POA: Diagnosis present

## 2019-04-24 DIAGNOSIS — M961 Postlaminectomy syndrome, not elsewhere classified: Secondary | ICD-10-CM | POA: Diagnosis not present

## 2019-04-24 DIAGNOSIS — F411 Generalized anxiety disorder: Secondary | ICD-10-CM | POA: Diagnosis not present

## 2019-04-24 DIAGNOSIS — M5136 Other intervertebral disc degeneration, lumbar region: Secondary | ICD-10-CM | POA: Diagnosis present

## 2019-04-24 DIAGNOSIS — M5030 Other cervical disc degeneration, unspecified cervical region: Secondary | ICD-10-CM | POA: Insufficient documentation

## 2019-04-24 DIAGNOSIS — G894 Chronic pain syndrome: Secondary | ICD-10-CM | POA: Diagnosis not present

## 2019-04-24 NOTE — Patient Instructions (Signed)
Preparing for Procedure with Sedation Instructions: . Oral Intake: Do not eat or drink anything for at least 8 hours prior to your procedure. . Transportation: Public transportation is not allowed. Bring an adult driver. The driver must be physically present in our waiting room before any procedure can be started. . Physical Assistance: Bring an adult capable of physically assisting you, in the event you need help. . Blood Pressure Medicine: Take your blood pressure medicine with a sip of water the morning of the procedure. . Insulin: Take only  of your normal insulin dose. . Preventing infections: Shower with an antibacterial soap the morning of your procedure. . Build-up your immune system: Take 1000 mg of Vitamin C with every meal (3 times a day) the day prior to your procedure. . Pregnancy: If you are pregnant, call and cancel the procedure. . Sickness: If you have a cold, fever, or any active infections, call and cancel the procedure. . Arrival: You must be in the facility at least 30 minutes prior to your scheduled procedure. . Children: Do not bring children with you. . Dress appropriately: Bring dark clothing that you would not mind if they get stained. . Valuables: Do not bring any jewelry or valuables. Procedure appointments are reserved for interventional treatments only. . No Prescription Refills. . No medication changes will be discussed during procedure appointments. No disability issues will be discussed.Facet Blocks Patient Information  Description: The facets are joints in the spine between the vertebrae.  Like any joints in the body, facets can become irritated and painful.  Arthritis can also effect the facets.  By injecting steroids and local anesthetic in and around these joints, we can temporarily block the nerve supply to them.  Steroids act directly on irritated nerves and tissues to reduce selling and inflammation which often leads to decreased pain.  Facet blocks may  be done anywhere along the spine from the neck to the low back depending upon the location of your pain.   After numbing the skin with local anesthetic (like Novocaine), a small needle is passed onto the facet joints under x-ray guidance.  You may experience a sensation of pressure while this is being done.  The entire block usually lasts about 15-25 minutes.   Conditions which may be treated by facet blocks:  Low back/buttock pain Neck/shoulder pain Certain types of headaches  Preparation for the injection:  Do not eat any solid food or dairy products within 8 hours of your appointment. You may drink clear liquid up to 3 hours before appointment.  Clear liquids include water, black coffee, juice or soda.  No milk or cream please. You may take your regular medication, including pain medications, with a sip of water before your appointment.  Diabetics should hold regular insulin (if taken separately) and take 1/2 normal NPH dose the morning of the procedure.  Carry some sugar containing items with you to your appointment. A driver must accompany you and be prepared to drive you home after your procedure. Bring all your current medications with you. An IV may be inserted and sedation may be given at the discretion of the physician. A blood pressure cuff, EKG and other monitors will often be applied during the procedure.  Some patients may need to have extra oxygen administered for a short period. You will be asked to provide medical information, including your allergies and medications, prior to the procedure.  We must know immediately if you are taking blood thinners (like Coumadin/Warfarin) or if   you are allergic to IV iodine contrast (dye).  We must know if you could possible be pregnant.  Possible side-effects:  Bleeding from needle site Infection (rare, may require surgery) Nerve injury (rare) Numbness & tingling (temporary) Difficulty urinating (rare, temporary) Spinal headache (a  headache worse with upright posture) Light-headedness (temporary) Pain at injection site (serveral days) Decreased blood pressure (rare, temporary) Weakness in arm/leg (temporary) Pressure sensation in back/neck (temporary)   Call if you experience:  Fever/chills associated with headache or increased back/neck pain Headache worsened by an upright position New onset, weakness or numbness of an extremity below the injection site Hives or difficulty breathing (go to the emergency room) Inflammation or drainage at the injection site(s) Severe back/neck pain greater than usual New symptoms which are concerning to you  Please note:  Although the local anesthetic injected can often make your back or neck feel good for several hours after the injection, the pain will likely return. It takes 3-7 days for steroids to work.  You may not notice any pain relief for at least one week.  If effective, we will often do a series of 2-3 injections spaced 3-6 weeks apart to maximally decrease your pain.  After the initial series, you may be a candidate for a more permanent nerve block of the facets.  If you have any questions, please call [phone] . [facility] 

## 2019-04-24 NOTE — Progress Notes (Signed)
Patient's Name: Katrina Holmes  MRN: [id number]  Referring Provider: Gale Journey, MD  DOB: 02-25-1956  PCP: Patient, No Pcp Per  DOS: 04/24/2019  Note by: Gillis Santa, MD  Service setting: Ambulatory outpatient  Specialty: Interventional Pain Management  Location: [facility] (AMB) Pain Management Facility  Visit type: Initial Patient Evaluation  Patient type: New Patient   Primary Reason(s) for Visit: Encounter for initial evaluation of one or more chronic problems (new to examiner) potentially causing chronic pain, and posing a threat to normal musculoskeletal function. (Level of risk: High) CC: Back Pain (lower)  HPI  Katrina Holmes is a 63 y.o. year old, female patient, who comes today to see [place] for the first time for an initial evaluation of her chronic pain. She has DDD (degenerative disc disease), cervical; DJD of shoulder; DDD (degenerative disc disease), lumbar; Severe recurrent major depression without psychotic features ([place]); HTN (hypertension); Tobacco use disorder; Opioid use disorder, moderate, dependence ([place]); Sedative, hypnotic or anxiolytic use disorder, severe, dependence ([place]); Anxiety; Cervical postlaminectomy syndrome; Encounter for tobacco use cessation counseling; Chronic midline low back pain with left-sided sciatica; Gastroesophageal reflux disease without esophagitis; Hyperlipidemia; Irritable bowel syndrome with diarrhea; Lumbar facet arthropathy; Major depressive disorder; Seasonal allergies; Mixed bipolar I disorder (HCC); PTSD (post-traumatic stress disorder); GAD (generalized anxiety disorder); and Radial nerve palsy, unspecified laterality on their problem list. Today she comes in for evaluation of her Back Pain (lower)  Pain Assessment: Location: Lower Back Radiating: right upper leg, left leg to the foot Onset: More than a month ago Duration: Chronic pain Quality: Throbbing, Burning, Aching Severity: 7 /10 (subjective, self-reported pain score)  Note: Reported  level is inconsistent with clinical observations.                         When using our objective Pain Scale, levels between 6 and 10/10 are said to belong in an emergency room, as it progressively worsens from a 6/10, described as severely limiting, requiring emergency care not usually available at an outpatient pain management facility. At a 6/10 level, communication becomes difficult and requires great effort. Assistance to reach the emergency department may be required. Facial flushing and profuse sweating along with potentially dangerous increases in heart rate and blood pressure will be evident. Effect on ADL: difficulty performing daily activities Timing: Constant Modifying factors: Hydrocodone, icy/hot, Lidocaine cream BP: (!) 175/95  HR: (!) 102  Onset and Duration: Gradual Cause of pain: Unknown Severity: Getting worse, NAS-11 at its worse: 8/10, NAS-11 at its best: 5/10, NAS-11 now: 7/10 and NAS-11 on the average: 7/10 Timing: Not influenced by the time of the day Aggravating Factors: Bending, Climbing, Kneeling, Lifiting, Motion, Prolonged sitting, Prolonged standing, Squatting, Stooping , Twisting, Walking, Walking uphill, Walking downhill and Working Alleviating Factors: Cold packs, Hot packs, Nerve blocks and Warm showers or baths Associated Problems: Fatigue, Nausea, Numbness, Spasms, Pain that wakes patient up and Pain that does not allow patient to sleep Quality of Pain: Aching, Agonizing, Annoying, Burning, Constant, Cramping, Deep, Disabling, Exhausting, Feeling of weight, Getting longer, Heavy, Horrible, Hot, Itching, Nagging, Pressure-like, Sharp, Shooting, Stabbing, Tender, Throbbing, Tingling, Tiring and Toothache-like Previous Examinations or Tests: MRI scan Previous Treatments: Epidural steroid injections, Facet blocks, Narcotic medications and Physical Therapy  The patient comes into the clinics today for the first time for a chronic pain management evaluation.    63 year old female who presents with a chief complaint of chronic neck and lower back pain with radiation into  her left leg and foot.  This is been present for many years.  She was previously seen at this clinic by Dr. Primus Bravo.  She has been seen by the pain clinic at [facility] previously.  She was referred to pain psychology there to be considered for chronic opioid therapy but states that her "car would not make it to [facility]".  There is a note in her chart of opioid use disorder.  The patient became frustrated when I brought this out.  She states that she was mislabeled this by her prior PCP and this diagnosis has stayed with her even though it is not true she states.  Patient became somewhat dismissive when I was asked her about other medications tried.  I informed her that I need to do a full intake to determine what I can offer her in terms of treatment.  She has tried various membrane stabilizers in the past including gabapentin, Lyrica which resulted in cognitive side effects.  She states that Celebrex resulted in a mini stroke.  She does not utilize Klonopin anymore.  She states that her anxiety is slightly better.  She has received lumbar facet medial branch nerve blocks at this clinic in the past which she states were effective.  She is interested in having these repeated.  Historic Controlled Substance Pharmacotherapy Review  PMP and historical list of controlled substances:  04/09/2019  2   04/09/2019  Tramadol Hcl 50 MG Tablet  12.00  3 [place]   [id number]   [place] ([id number])   0  20.00 MME  Comm Ins   [place]  03/28/2019  2   03/28/2019  Hydrocodone-Acetamin 5-325 MG  12.00  3 [place]   [id number]   [facility] ([id number])   0  20.00 MME  Comm Ins   [place]  03/05/2019  2   03/05/2019  Hydrocodone-Acetamin 5-325 MG  20.00  5 [place]   [id number]   [place] ([id number])   0  20.00 MME  Comm Ins   [place]     Medications: The patient did not bring the medication(s) to the appointment, as requested in our "New Patient  Package" Pharmacodynamics: Desired effects: Analgesia: The patient reports <50% benefit. Reported improvement in function: The patient reports medication allows her to accomplish basic ADLs. Clinically meaningful improvement in function (CMIF): Sustained CMIF goals met Perceived effectiveness: Described as relatively effective but with some room for improvement Undesirable effects: Side-effects or Adverse reactions: None reported Historical Monitoring: The patient  reports no history of drug use. List of all UDS Test(s): Lab Results  Component Value Date   MDMA NONE DETECTED 03/12/2017   MDMA NONE DETECTED 01/11/2017   MDMA NONE DETECTED 12/28/2015   COCAINSCRNUR NONE DETECTED 03/12/2017   COCAINSCRNUR NONE DETECTED 01/11/2017   COCAINSCRNUR NONE DETECTED 12/28/2015   PCPSCRNUR NONE DETECTED 03/12/2017   PCPSCRNUR NONE DETECTED 01/11/2017   PCPSCRNUR NONE DETECTED 12/28/2015   THCU NONE DETECTED 03/12/2017   THCU NONE DETECTED 01/11/2017   THCU NONE DETECTED 12/28/2015   ETH <5 03/12/2017   ETH 94 (H) 01/11/2017   ETH <5 12/28/2015   List of other Serum/Urine Drug Screening Test(s):  Lab Results  Component Value Date   COCAINSCRNUR NONE DETECTED 03/12/2017   COCAINSCRNUR NONE DETECTED 01/11/2017   COCAINSCRNUR NONE DETECTED 12/28/2015   THCU NONE DETECTED 03/12/2017   THCU NONE DETECTED 01/11/2017   THCU NONE DETECTED 12/28/2015   ETH <5 03/12/2017   ETH 94 (H) 01/11/2017   ETH <5  12/28/2015   Historical Background Evaluation: [place] PMP: PDMP reviewed during this encounter. Six (6) year initial data search conducted.             [place] Department of public safety, offender search: Editor, commissioning Information) Non-contributory Risk Assessment Profile: Aberrant behavior: claims that "nothing else works", extensive time Water quality scientist and repeated negotiations to obtain more medication Risk factors for fatal opioid overdose: Benzodiazepine use and +psych Fatal overdose hazard  ratio (HR): Calculation deferred Non-fatal overdose hazard ratio (HR): Calculation deferred Risk of opioid abuse or dependence: 0.7-3.0% with doses ? 36 MME/day and 6.1-26% with doses ? 120 MME/day. Substance use disorder (SUD) risk level: Pending results of Medical Psychology Evaluation for SUD Personal History of Substance Abuse (SUD-Substance use disorder):  Alcohol: Negative  Illegal Drugs: Negative  Rx Drugs: Negative  ORT Risk Level calculation: Moderate Risk Opioid Risk Tool - 04/24/19 1413      Family History of Substance Abuse   Alcohol  Positive Female    Illegal Drugs  Positive Female    Rx Drugs  Positive Female or Female      Personal History of Substance Abuse   Alcohol  Negative    Illegal Drugs  Negative    Rx Drugs  Negative      Age   Age between 28-45 years   No      History of Preadolescent Sexual Abuse   History of Preadolescent Sexual Abuse  Negative or Female      Psychological Disease   Psychological Disease  Negative    Depression  Negative      Total Score   Opioid Risk Tool Scoring  7    Opioid Risk Interpretation  Moderate Risk      ORT Scoring interpretation table:  Score <3 = Low Risk for SUD  Score between 4-7 = Moderate Risk for SUD  Score >8 = High Risk for Opioid Abuse   PHQ-2 Depression Scale:  Total score: 0  PHQ-2 Scoring interpretation table: (Score and probability of major depressive disorder)  Score 0 = No depression  Score 1 = 15.4% Probability  Score 2 = 21.1% Probability  Score 3 = 38.4% Probability  Score 4 = 45.5% Probability  Score 5 = 56.4% Probability  Score 6 = 78.6% Probability   PHQ-9 Depression Scale:  Total score: 0  PHQ-9 Scoring interpretation table:  Score 0-4 = No depression  Score 5-9 = Mild depression  Score 10-14 = Moderate depression  Score 15-19 = Moderately severe depression  Score 20-27 = Severe depression (2.4 times higher risk of SUD and 2.89 times higher risk of overuse)   Pharmacologic Plan:  As per protocol, I have not taken over any controlled substance management, pending the results of ordered tests and/or consults.            Initial impression: Pending review of available data and ordered tests.  Meds   Current Outpatient Medications:  .  albuterol (VENTOLIN HFA) 108 (90 Base) MCG/ACT inhaler, Inhale into the lungs., Disp: , Rfl:  .  aspirin EC 81 MG tablet, Take 81 mg by mouth daily., Disp: , Rfl:  .  dicyclomine (BENTYL) 20 MG tablet, Take 20 mg by mouth every 6 (six) hours as needed for spasms., Disp: , Rfl:  .  fluticasone (FLONASE) 50 MCG/ACT nasal spray, Place 1 spray into both nostrils daily., Disp: , Rfl: 1 .  loratadine (CLARITIN) 10 MG tablet, Take 10 mg by mouth daily., Disp: , Rfl:  .  omeprazole (PRILOSEC) 20 MG capsule, Take 20 mg by mouth daily., Disp: , Rfl: 0 .  ondansetron (ZOFRAN ODT) 4 MG disintegrating tablet, Take 1 tablet (4 mg total) by mouth every 8 (eight) hours as needed for nausea or vomiting., Disp: 20 tablet, Rfl: 0 .  acetaminophen (TYLENOL) 325 MG tablet, Take by mouth., Disp: , Rfl:  .  clonazePAM (KLONOPIN) 0.5 MG tablet, TK 1 T PO ONCE A DAY FOR PANIC, Disp: , Rfl:  .  clonazePAM (KLONOPIN) 0.5 MG tablet, Take 0.5-1 tablets (0.25-0.5 mg total) by mouth as directed. Once a day as needed for severe panic attacks only (Patient not taking: Reported on 04/24/2019), Disp: 30 tablet, Rfl: 0 .  FLUoxetine (PROZAC) 40 MG capsule, Take 1 capsule (40 mg total) by mouth daily with breakfast. (Patient not taking: Reported on 04/24/2019), Disp: 90 capsule, Rfl: 0 .  hydrOXYzine (VISTARIL) 50 MG capsule, Take 1 capsule (50 mg total) by mouth 2 (two) times daily as needed for anxiety. For anxiety attacks (Patient not taking: Reported on 04/24/2019), Disp: 60 capsule, Rfl: 1 .  lisinopril (PRINIVIL,ZESTRIL) 5 MG tablet, Take 5 mg by mouth daily., Disp: , Rfl: 2 .  OLANZapine (ZYPREXA) 7.5 MG tablet, Take 1 tablet (7.5 mg total) by mouth at bedtime. (Patient not  taking: Reported on 04/24/2019), Disp: 90 tablet, Rfl: 0 .  ondansetron (ZOFRAN) 4 MG tablet, Take by mouth., Disp: , Rfl:  .  predniSONE (STERAPRED UNI-PAK 21 TAB) 10 MG (21) TBPK tablet, Take 6 tablets on the first day and decrease by 1 tablet each day until finished. (Patient not taking: Reported on 04/24/2019), Disp: 21 tablet, Rfl: 0 .  propranolol (INDERAL) 10 MG tablet, Take 10 mg by mouth 2 (two) times daily., Disp: , Rfl:  .  sodium chloride 1 g tablet, Take 1 tablet (1 g total) by mouth 3 (three) times daily with meals. (Patient not taking: Reported on 04/24/2019), Disp: 21 tablet, Rfl: 0 .  tiZANidine (ZANAFLEX) 4 MG tablet, Take 1 tablet (4 mg total) by mouth every 8 (eight) hours as needed for muscle spasms. (Patient not taking: Reported on 04/24/2019), Disp: 90 tablet, Rfl: 0 .  traMADol (ULTRAM) 50 MG tablet, Take 1 tablet (50 mg total) by mouth every 6 (six) hours as needed. (Patient not taking: Reported on 04/24/2019), Disp: 12 tablet, Rfl: 0  Imaging Review  Cervical Imaging:  Cervical DG complete:  Results for orders placed during the hospital encounter of 09/03/18  DG Cervical Spine Complete   Narrative CLINICAL DATA:  Neck pain with radiation to left shoulder. Prior fall.  EXAM: CERVICAL SPINE - COMPLETE 4+ VIEW  COMPARISON:  CT 07/14/2012.  FINDINGS: C4-C5 and C5-C6 interbody fusion. Anatomic alignment. Hardware intact. No acute bony abnormality. No evidence of fracture dislocation. Soft tissues are unremarkable. Carotid vascular disease.  IMPRESSION: C4-C5 and C5-C6 interbody fusion. Anatomic alignment. Hardware intact. No acute bony abnormality.   Electronically Signed   By: Marcello Moores  Register   On: 09/03/2018 10:54    Shoulder-L DG:  Results for orders placed during the hospital encounter of 09/03/18  DG Shoulder Left   Narrative CLINICAL DATA:  Left shoulder pain after fall  EXAM: LEFT SHOULDER - 2+ VIEW  COMPARISON:  MRI  03/22/2006  FINDINGS: Negative for acute fracture or dislocation. Postoperative acromion and distal clavicle. Rotator cuff anchors.  IMPRESSION: 1. No acute finding. 2. Postoperative rotator cuff and AC joint.   Electronically Signed   By: Neva Seat.D.  On: 09/03/2018 10:10    Lumbar DG 2-3 views:  Results for orders placed during the hospital encounter of 03/01/16  DG Lumbar Spine 2-3 Views   Narrative CLINICAL DATA:  Back pain since fall last week getting worse.  EXAM: LUMBAR SPINE - 2-3 VIEW  COMPARISON:  CT 11/09/2015  FINDINGS: Vertebral body alignment, heights and disc space heights are within normal. There is minimal spondylosis of the lumbar spine to include facet arthropathy. No compression fracture or subluxation per mild calcified plaque over the abdominal aorta.  IMPRESSION: No acute findings.  Mild spondylosis of the lumbar spine.   Electronically Signed   By: Marin Olp M.D.   On: 03/01/2016 16:51    L-Spine MRI [facility]  63 years old Female with left L5/S1 radicular pain - M54.17 - Left lumbosacral radiculopathy - M54.5 - Low back pain of over 3 months duration   COMPARISON: MRI lumbar spine 09/17/2013.  TECHNIQUE: Multiplanar MRI was performed through the lumbar spine without intravenous contrast.  FINDINGS:  For the purposes of this dictation, the lowest well formed intervertebral disc space is assumed to be the L5-S1 level, and there are presumed to be five lumbar-type vertebral bodies.  Normal lumbar lordosis is observed. Vertebral body heights are maintained. The signal intensity throughout the bone marrow is within normal limits. The conus medullaris ends at a normal level in the visualized spinal cord is unremarkable. Multilevel disc desiccation is present, progressed from 2015.  L1-L2: No significant spinal canal stenosis and neural foraminal narrowing.  L2-L3: No significant spinal canal stenosis or neural foraminal narrowing.  Mild ligament of flavum thickening.  L3-L4: Circumferential disc bulge with thickening of the ligamentum flavum. Mild canal stenosis and mild left neural foraminal narrowing, progressed from 2015. No right neural foraminal narrowing.  L4-L5: Circumferential disc bulge and facet arthrosis. Mild spinal canal stenosis and mild to moderate bilateral neural foraminal narrowing, minimally progressed from 2015.  L5-S1: Posterior disc bulge with facet arthrosis. No significant spinal canal or lateral recess stenosis. Moderate bilateral neural foraminal narrowing, minimally progressed from 2015.  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   Complexity Note: Imaging results reviewed. Results shared with Katrina Holmes, using Layman's terms.                         ROS  Cardiovascular: Daily Aspirin intake and High blood pressure Pulmonary or Respiratory: Smoking Neurological: No reported neurological signs or symptoms such as seizures, abnormal skin sensations, urinary and/or fecal incontinence, being born with an abnormal open spine and/or a tethered spinal cord Review of Past Neurological Studies: No results found for this or any previous visit. Psychological-Psychiatric: No reported psychological or psychiatric signs or symptoms such as difficulty sleeping, anxiety, depression, delusions or hallucinations (schizophrenial), mood swings (bipolar disorders) or suicidal ideations or attempts Gastrointestinal: Reflux or heatburn Genitourinary: No reported renal or genitourinary signs or symptoms such as difficulty voiding or producing urine, peeing blood, non-functioning kidney, kidney stones, difficulty emptying the bladder, difficulty controlling the flow of urine, or chronic kidney disease Hematological: No reported hematological signs or symptoms such as prolonged bleeding, low or poor functioning platelets, bruising or bleeding easily, hereditary bleeding problems, low  energy levels due to low hemoglobin or being anemic Endocrine: No reported endocrine signs or symptoms such as high or low blood sugar, rapid heart rate due to high thyroid levels, obesity or weight gain due to slow thyroid or thyroid disease Rheumatologic:  No reported rheumatological signs and symptoms such as fatigue, joint pain, tenderness, swelling, redness, heat, stiffness, decreased range of motion, with or without associated rash Musculoskeletal: Negative for myasthenia gravis, muscular dystrophy, multiple sclerosis or malignant hyperthermia Work History: Retired  Allergies  Katrina Holmes is allergic to peanut oil; gabapentin; nsaids; trazodone and nefazodone; and penicillins.  Laboratory Chemistry Profile   Screening Lab Results  Component Value Date   HIV Non Reactive 01/12/2017   PREGTESTUR NEGATIVE 01/11/2017    Inflammation (CRP: Acute Phase) (ESR: Chronic Phase) Lab Results  Component Value Date   LATICACIDVEN 1.7 01/11/2017                         Rheumatology No results found for: RF, ANA, LABURIC, URICUR, LYMEIGGIGMAB, LYMEABIGMQN, HLAB27                      Renal Lab Results  Component Value Date   BUN 7 (L) 03/09/2019   CREATININE 0.63 03/09/2019   GFRAA >60 03/09/2019   GFRNONAA >60 03/09/2019                             Hepatic Lab Results  Component Value Date   AST 24 03/09/2019   ALT 13 03/09/2019   ALBUMIN 4.5 03/09/2019   ALKPHOS 59 03/09/2019   LIPASE 37 03/09/2019                        Electrolytes Lab Results  Component Value Date   NA 126 (L) 03/09/2019   K 3.7 03/09/2019   CL 93 (L) 03/09/2019   CALCIUM 9.7 03/09/2019                        Neuropathy Lab Results  Component Value Date   VITAMINB12 244 01/12/2017   HGBA1C 5.5 01/13/2017   HIV Non Reactive 01/12/2017                        Coagulation Lab Results  Component Value Date   PLT 373 03/09/2019                        Cardiovascular Lab Results  Component  Value Date   TROPONINI <0.03 07/14/2018   HGB 13.3 03/09/2019   HCT 39.1 03/09/2019                         ID Lab Results  Component Value Date   HIV Non Reactive 01/12/2017   PREGTESTUR NEGATIVE 01/11/2017   MICROTEXT  10/03/2014       SOURCE: VAGINAL SWAB    CHLAMYDIA                 CHLAMYDIA TRACHOMATIS NEGATIVE   N.GONORRHOEAE             N.GONORRHOEAE NEGATIVE   ANTIBIOTIC                                                       MICROTEXT  10/03/2014       COMMENT  FEW WHITE BLOOD CELLS SEEN   COMMENT                   NO TRICHOMONAS,SPERMATOZOA,YEAST,OR CLUE CELLS SEEN   ANTIBIOTIC                                                        Cancer No results found for: CEA, CA125, LABCA2                      Endocrine Lab Results  Component Value Date   TSH 3.540 01/13/2017                        Note: Lab results reviewed.  PFSH  Drug: Katrina Holmes  reports no history of drug use. Alcohol:  reports no history of alcohol use. Tobacco:  reports that she has been smoking cigarettes. She has been smoking about 0.50 packs per day. She has never used smokeless tobacco. Medical:  has a past medical history of Allergy, Anxiety, Degenerative disc disease, lumbar, Depression, Hypertension, IBS (irritable bowel syndrome) (15 years), PTSD (post-traumatic stress disorder), Sciatica, and Suicide attempt ([place]). Family: family history includes Heart disease in her mother; Stroke in her father.  Past Surgical History:  Procedure Laterality Date  . ABDOMINAL HYSTERECTOMY    . NECK SURGERY  2006  . SHOULDER ARTHROSCOPY WITH ROTATOR CUFF REPAIR Left    3 surgeries (2006-2007-2008)   Active Ambulatory Problems    Diagnosis Date Noted  . DDD (degenerative disc disease), cervical 05/25/2015  . 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 DDD (degenerative disc disease), lumbar 05/25/2015  . Severe recurrent major depression without psychotic features ([place]) 12/29/2015  . HTN  (hypertension) 01/11/2017  . Tobacco use disorder 01/12/2017  . Opioid use disorder, moderate, dependence ([place]) 01/15/2017  . Sedative, hypnotic or anxiolytic use disorder, severe, dependence ([place]) 01/15/2017  . Anxiety 08/01/2017  . Cervical postlaminectomy syndrome 08/01/2017  . Encounter for tobacco use cessation counseling 11/12/2018  . Chronic midline low back pain with left-sided sciatica 05/22/2018  . Gastroesophageal reflux disease without esophagitis 10/03/2012  . Hyperlipidemia 10/03/2012  . Irritable bowel syndrome with diarrhea 05/22/2018  . Lumbar facet arthropathy 08/01/2017  . Major depressive disorder 10/17/2013  . Seasonal allergies 05/22/2018  . Mixed bipolar I disorder ([place]) 01/27/2019  . PTSD (post-traumatic stress disorder) 01/27/2019  . GAD (generalized anxiety disorder) 01/27/2019  . Radial nerve palsy, unspecified laterality 11/21/2018   Resolved Ambulatory Problems    Diagnosis Date Noted  . No Resolved Ambulatory Problems   Past Medical History:  Diagnosis Date  . Allergy   . Degenerative disc disease, lumbar   . Depression   . Hypertension   . IBS (irritable bowel syndrome) 15 years  . Sciatica   . Suicide attempt [facility])    Constitutional Exam  General appearance: Well nourished, well developed, and well hydrated. In no apparent acute distress Vitals:   04/24/19 1405  BP: (!) 175/95  Pulse: (!) 102  Resp: 16  Temp: 98.1 F (36.7 C)  TempSrc: Temporal  SpO2: 100%  Weight: 139 lb (63 kg)  Height: '5\' 6"'$  (1.676 m)   BMI Assessment: Estimated body mass index is 22.44 kg/m as calculated from the following:   Height as of this encounter: 5'  6" (1.676 m).   Weight as of this encounter: 139 lb (63 kg).  BMI interpretation table: BMI level Category Range association with higher incidence of chronic pain  <18 kg/m2 Underweight   18.5-24.9 kg/m2 Ideal body weight   25-29.9 kg/m2 Overweight Increased incidence by 20%  30-34.9 kg/m2 Obese (Class I)  Increased incidence by 68%  35-39.9 kg/m2 Severe obesity (Class II) Increased incidence by 136%  >40 kg/m2 Extreme obesity (Class III) Increased incidence by 254%   Patient's current BMI Ideal Body weight  Body mass index is 22.44 kg/m. Ideal body weight: 59.3 kg (130 lb 11.7 oz) Adjusted ideal body weight: 60.8 kg (134 lb 0.6 oz)   BMI Readings from Last 4 Encounters:  04/24/19 22.44 kg/m  04/09/19 21.63 kg/m  03/09/19 21.79 kg/m  01/05/19 22.60 kg/m   Wt Readings from Last 4 Encounters:  04/24/19 139 lb (63 kg)  04/09/19 134 lb (60.8 kg)  03/09/19 135 lb (61.2 kg)  01/05/19 140 lb (63.5 kg)  Psych/Mental status: Alert, oriented x 3 (person, place, & time)       Eyes: PERLA Respiratory: No evidence of acute respiratory distress  Cervical Spine Area Exam  Skin & Axial Inspection: No masses, redness, edema, swelling, or associated skin lesions Alignment: Symmetrical Functional ROM: Decreased ROM      Stability: No instability detected Muscle Tone/Strength: Functionally intact. No obvious neuro-muscular anomalies detected. Sensory (Neurological): Musculoskeletal pain pattern   Upper Extremity (UE) Exam    Side: Right upper extremity  Side: Left upper extremity  Skin & Extremity Inspection: Skin color, temperature, and hair growth are WNL. No peripheral edema or cyanosis. No masses, redness, swelling, asymmetry, or associated skin lesions. No contractures.  Skin & Extremity Inspection: Skin color, temperature, and hair growth are WNL. No peripheral edema or cyanosis. No masses, redness, swelling, asymmetry, or associated skin lesions. No contractures.  Functional ROM: Unrestricted ROM          Functional ROM: Unrestricted ROM          Muscle Tone/Strength: Functionally intact. No obvious neuro-muscular anomalies detected.  Muscle Tone/Strength: Functionally intact. No obvious neuro-muscular anomalies detected.  Sensory (Neurological): Unimpaired          Sensory (Neurological):  Unimpaired              Provocative Test(s):  Phalen's test: deferred Tinel's test: deferred Apley's scratch test (touch opposite shoulder):  Action 1 (Across chest): deferred Action 2 (Overhead): deferred Action 3 (LB reach): deferred   Provocative Test(s):  Phalen's test: deferred Tinel's test: deferred Apley's scratch test (touch opposite shoulder):  Action 1 (Across chest): deferred Action 2 (Overhead): deferred Action 3 (LB reach): deferred    Thoracic Spine Area Exam  Skin & Axial Inspection: No masses, redness, or swelling Alignment: Symmetrical Functional ROM: Unrestricted ROM Stability: No instability detected Muscle Tone/Strength: Functionally intact. No obvious neuro-muscular anomalies detected. Sensory (Neurological): Unimpaired   Lumbar Spine Area Exam  Skin & Axial Inspection: No masses, redness, or swelling Alignment: Symmetrical Functional ROM: Pain restricted ROM affecting both sides L>R Stability: No instability detected Muscle Tone/Strength: Functionally intact. No obvious neuro-muscular anomalies detected. Sensory (Neurological): Dermatomal pain pattern and MSK  Provocative Tests: Hyperextension/rotation test: (+) bilaterally for facet joint pain. Lumbar quadrant test (Kemp's test): (+) bilaterally for facet joint pain. Lateral bending test: (+) due to pain. Patrick's Maneuver: deferred today                   FABER* test: deferred  today                   S-I anterior distraction/compression test: deferred today         S-I lateral compression test: deferred today         S-I Thigh-thrust test: deferred today         S-I Gaenslen's test: deferred today         *(Flexion, ABduction and External Rotation)  Gait & Posture Assessment  Ambulation: Unassisted Gait: Antalgic gait (limping) Posture: Difficulty with positional changes   Lower Extremity Exam    Side: Right lower extremity  Side: Left lower extremity  Stability: No instability observed           Stability: No instability observed          Skin & Extremity Inspection: Skin color, temperature, and hair growth are WNL. No peripheral edema or cyanosis. No masses, redness, swelling, asymmetry, or associated skin lesions. No contractures.  Skin & Extremity Inspection: Skin color, temperature, and hair growth are WNL. No peripheral edema or cyanosis. No masses, redness, swelling, asymmetry, or associated skin lesions. No contractures.  Functional ROM: Pain restricted ROM for hip and Holmes joints          Functional ROM: Pain restricted ROM for hip and Holmes joints          Muscle Tone/Strength: Functionally intact. No obvious neuro-muscular anomalies detected.  Muscle Tone/Strength: Functionally intact. No obvious neuro-muscular anomalies detected.  Sensory (Neurological): Dermatomal pain pattern        Sensory (Neurological): Dermatomal pain pattern        DTR: Patellar: 2+: normal Achilles: deferred today Plantar: deferred today  DTR: Patellar: 2+: normal Achilles: deferred today Plantar: deferred today  Palpation: No palpable anomalies  Palpation: No palpable anomalies   Assessment  Primary Diagnosis & Pertinent Problem List: The primary encounter diagnosis was Chronic pain syndrome. Diagnoses of Cervical postlaminectomy syndrome, Lumbar facet arthropathy, GAD (generalized anxiety disorder), Opioid use disorder, moderate, dependence ([place]), DDD (degenerative disc disease), cervical, and DDD (degenerative disc disease), lumbar were also pertinent to this visit.  Visit Diagnosis (New problems to examiner): 1. Chronic pain syndrome   2. Cervical postlaminectomy syndrome   3. Lumbar facet arthropathy   4. GAD (generalized anxiety disorder)   5. Opioid use disorder, moderate, dependence ([place])   6. DDD (degenerative disc disease), cervical   7. DDD (degenerative disc disease), lumbar    Plan of Care (Initial workup plan)  Note: Katrina Holmes was reminded that as per protocol, today's  visit has been an evaluation only. We have not taken over the patient's controlled substance management.  I had extensive discussion with the patient about the goals of pain management.  We discussed nonpharmacological approaches to pain management that include physical therapy, dieting, sleep hygiene, psychotherapy, interventional therapy.  We discussed the importance of understanding the type of pain including neuropathic, nociceptive, centralized.  I also stressed the importance of multimodal analgesia with an emphasis on nondrug modalities including self management, behavioral health support and physical therapy.  We discussed the importance of physical therapy and how a individualized physical therapy and occupational therapy program tailored to patient limitations can be helpful at improving physical function. We also discussed the importance of insomnia and disrupted sleep and how improved sleep hygiene and cognitive therapy could be helpful.  Psychotherapy including CBT, mind-body therapies, pain coping strategies can be helpful for patients whose pain impacts mood, sleep, quality of life, relationships  with others.  We discussed avoiding benzodiazepines.  I also had an extensive discussion with the patient about interventional therapies which is my expertise and how these could be incorporated into an effective multimodal pain management plan.  Regards to treatment plan, from an interventional standpoint, patient has obtained benefit from previous lumbar facet medial branch nerve blocks.  Her recent MRI continues to show lumbar degenerative disc disease that is worsened from previous MRI in 2015 (see results above).  She has multilevel lumbar facet arthropathy and lumbar spondylosis most pronounced at L3, L4, L5, S1.  We discussed diagnostic lumbar facet medial branch nerve blocks bilaterally L3, L4, L5 with sedation.  Risks and benefits reviewed and patient like to proceed.  Regards to medication  management, I was very clear with the patient that she will only be a candidate for buprenorphine or tramadol.  I would not be prescribing her any controlled 2 substances given previous concerns in her medical record.  We had extensive discussion about buprenorphine and its benefits.  We will continue discussion after patient has completed psych eval and UDS.   Lab Orders     UDS (Comprehensive-24) ([facility]) ([facility]) (New Pt.)  Referral Orders     SUD Evaluation (Med.Psych.)  Procedure Orders     L-FCT Blk (Schedule)  Interventional management options: Katrina Holmes was informed that there is no guarantee that she would be a candidate for interventional therapies. The decision will be based on the results of diagnostic studies, as well as Katrina Holmes's risk profile.  Procedure(s) under consideration:  Bilateral lumbar facet medial branch nerve blocks Lumbar facet radiofrequency ablation SI joint injection   Provider-requested follow-up: Return in about 2 weeks (around 05/08/2019) for Procedure B/L L3, 4, 5 S1 Fcts , with sedation.  Future Appointments  Date Time Provider [place]  05/09/2019  3:00 PM Parks Ranger, Devonne Doughty, DO [facility] None    Primary Care Physician: Patient, No Pcp Per Location: [facility] Note by: Gillis Santa, MD Date: 04/24/2019; Time: 3:38 PM  Note: This dictation was prepared with Dragon dictation. Any transcriptional errors that may result from this process are unintentional.

## 2019-04-24 NOTE — Progress Notes (Signed)
Safety precautions to be maintained throughout the outpatient stay will include: orient to surroundings, keep bed in low position, maintain call bell within reach at all times, provide assistance with transfer out of bed and ambulation.  

## 2019-04-26 LAB — COMPLIANCE DRUG ANALYSIS, UR

## 2019-04-29 ENCOUNTER — Other Ambulatory Visit: Payer: Self-pay

## 2019-04-29 ENCOUNTER — Ambulatory Visit (INDEPENDENT_AMBULATORY_CARE_PROVIDER_SITE_OTHER): Payer: BLUE CROSS/BLUE SHIELD | Admitting: Psychiatry

## 2019-04-29 ENCOUNTER — Encounter: Payer: Self-pay | Admitting: Psychiatry

## 2019-04-29 DIAGNOSIS — F1221 Cannabis dependence, in remission: Secondary | ICD-10-CM

## 2019-04-29 DIAGNOSIS — F411 Generalized anxiety disorder: Secondary | ICD-10-CM

## 2019-04-29 DIAGNOSIS — Z9114 Patient's other noncompliance with medication regimen: Secondary | ICD-10-CM

## 2019-04-29 DIAGNOSIS — F3160 Bipolar disorder, current episode mixed, unspecified: Secondary | ICD-10-CM | POA: Diagnosis not present

## 2019-04-29 DIAGNOSIS — F17200 Nicotine dependence, unspecified, uncomplicated: Secondary | ICD-10-CM

## 2019-04-29 DIAGNOSIS — F4310 Post-traumatic stress disorder, unspecified: Secondary | ICD-10-CM

## 2019-04-29 DIAGNOSIS — F1021 Alcohol dependence, in remission: Secondary | ICD-10-CM | POA: Insufficient documentation

## 2019-04-29 DIAGNOSIS — Z008 Encounter for other general examination: Secondary | ICD-10-CM | POA: Insufficient documentation

## 2019-04-29 DIAGNOSIS — F1199 Opioid use, unspecified with unspecified opioid-induced disorder: Secondary | ICD-10-CM

## 2019-04-29 DIAGNOSIS — F1320 Sedative, hypnotic or anxiolytic dependence, uncomplicated: Secondary | ICD-10-CM

## 2019-04-29 DIAGNOSIS — F1190 Opioid use, unspecified, uncomplicated: Secondary | ICD-10-CM

## 2019-04-29 MED ORDER — DOXEPIN HCL 10 MG PO CAPS: 10.0000 mg | ORAL_CAPSULE | Freq: Every evening | ORAL | 1 refills | Status: DC | PRN
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
## 2019-04-30 ENCOUNTER — Ambulatory Visit: Payer: BLUE CROSS/BLUE SHIELD | Admitting: Psychiatry

## 2019-05-05 ENCOUNTER — Other Ambulatory Visit: Payer: Self-pay

## 2019-05-05 ENCOUNTER — Ambulatory Visit
Admission: RE | Admit: 2019-05-05 | Discharge: 2019-05-05 | Disposition: A | Discharge status: Home / Self Care | Payer: BLUE CROSS/BLUE SHIELD | Source: Ambulatory Visit | Attending: Student in an Organized Health Care Education/Training Program | Admitting: Student in an Organized Health Care Education/Training Program

## 2019-05-05 ENCOUNTER — Telehealth: Payer: Self-pay

## 2019-05-05 ENCOUNTER — Encounter: Payer: Self-pay | Admitting: Student in an Organized Health Care Education/Training Program

## 2019-05-05 ENCOUNTER — Ambulatory Visit (HOSPITAL_BASED_OUTPATIENT_CLINIC_OR_DEPARTMENT_OTHER): Payer: BLUE CROSS/BLUE SHIELD | Admitting: Student in an Organized Health Care Education/Training Program

## 2019-05-05 DIAGNOSIS — F4310 Post-traumatic stress disorder, unspecified: Secondary | ICD-10-CM

## 2019-05-05 DIAGNOSIS — M47816 Spondylosis without myelopathy or radiculopathy, lumbar region: Secondary | ICD-10-CM

## 2019-05-05 MED ORDER — FENTANYL CITRATE (PF) 100 MCG/2ML IJ SOLN
25.0000 ug | INTRAMUSCULAR | Status: DC | PRN
  Administered 2019-05-05: 09:00:00 50 ug via INTRAVENOUS

## 2019-05-05 MED ORDER — DEXAMETHASONE SODIUM PHOSPHATE 10 MG/ML IJ SOLN
INTRAMUSCULAR | Status: AC
  Filled 2019-05-05: qty 2

## 2019-05-05 MED ORDER — DEXAMETHASONE SODIUM PHOSPHATE 10 MG/ML IJ SOLN
20.0000 mg | Freq: Once | INTRAMUSCULAR | Status: DC
  Filled 2019-05-05: qty 2

## 2019-05-05 MED ORDER — LIDOCAINE HCL 2 % IJ SOLN
20.0000 mL | Freq: Once | INTRAMUSCULAR | Status: AC
  Administered 2019-05-05: 400 mg

## 2019-05-05 MED ORDER — DEXAMETHASONE SODIUM PHOSPHATE 10 MG/ML IJ SOLN
10.0000 mg | Freq: Once | INTRAMUSCULAR | Status: AC
  Administered 2019-05-05: 09:00:00 10 mg

## 2019-05-05 MED ORDER — FENTANYL CITRATE (PF) 100 MCG/2ML IJ SOLN
INTRAMUSCULAR | Status: AC
  Filled 2019-05-05: qty 2

## 2019-05-05 MED ORDER — ROPIVACAINE HCL 2 MG/ML IJ SOLN
9.0000 mL | Freq: Once | INTRAMUSCULAR | Status: AC
  Administered 2019-05-05: 9 mL via PERINEURAL

## 2019-05-05 MED ORDER — LIDOCAINE HCL 2 % IJ SOLN
INTRAMUSCULAR | Status: AC
  Filled 2019-05-05: qty 20

## 2019-05-05 MED ORDER — ROPIVACAINE HCL 2 MG/ML IJ SOLN
INTRAMUSCULAR | Status: AC
  Filled 2019-05-05: qty 20

## 2019-05-05 MED ORDER — DOXEPIN HCL 10 MG PO CAPS: 10.0000 mg | ORAL_CAPSULE | Freq: Every evening | ORAL | 0 refills | Status: DC | PRN

## 2019-05-05 MED ORDER — ROPIVACAINE HCL 2 MG/ML IJ SOLN: 9.0000 mL | Freq: Once | INTRAMUSCULAR | Status: DC

## 2019-05-05 MED ORDER — DEXAMETHASONE SODIUM PHOSPHATE 10 MG/ML IJ SOLN
INTRAMUSCULAR | Status: AC
  Filled 2019-05-05: qty 1

## 2019-05-05 NOTE — Patient Instructions (Signed)

## 2019-05-05 NOTE — Telephone Encounter (Signed)
pt states is still not sleeping and that the doxepin is not enough. pt also asked to be referred to someone who does Suboxone.

## 2019-05-05 NOTE — Progress Notes (Signed)
Safety precautions to be maintained throughout the outpatient stay will include: orient to surroundings, keep bed in low position, maintain call bell within reach at all times, provide assistance with transfer out of bed and ambulation.  

## 2019-05-05 NOTE — Progress Notes (Signed)
Patient's Name: Katrina Holmes  MRN: [id number]  Referring Provider: Edward Jolly, MD  DOB: Jul 31, 1955  PCP: Patient, No Pcp Per  DOS: 05/05/2019  Note by: Edward Jolly, MD  Service setting: Ambulatory outpatient  Specialty: Interventional Pain Management  Patient type: Established  Location: [facility] (AMB) Pain Management Facility  Visit type: Interventional Procedure   Primary Reason for Visit: Interventional Pain Management Treatment. CC: Back Pain (lower)  Procedure:          Anesthesia, Analgesia, Anxiolysis:  Type: Lumbar Facet, Medial Branch Block(s) #1  Primary Purpose: Diagnostic Region: Posterolateral Lumbosacral Spine Level: L3, L4, L5, & S1 Medial Branch Level(s). Injecting these levels blocks the L3-4, L4-5, and L5-S1 lumbar facet joints. Laterality: Left  Type: Moderate (Conscious) Sedation combined with Local Anesthesia Indication(s): Analgesia and Anxiety Route: Intravenous (IV) IV Access: Secured Sedation: Meaningful verbal contact was maintained at all times during the procedure  Local Anesthetic: Lidocaine 1-2%  Position: Prone   Indications: 1. Lumbar facet arthropathy    Pain Score: Pre-procedure: 5 /10 Post-procedure: 3 /10   Pre-op Assessment:  Katrina Holmes is a 63 y.o. (year old), female patient, seen today for interventional treatment. She  has a past surgical history that includes Neck surgery (2006); Shoulder arthroscopy with rotator cuff repair (Left); and Abdominal hysterectomy. Katrina Holmes has a current medication list which includes the following prescription(s): acetaminophen, albuterol, aspirin ec, dicyclomine, doxepin, fluticasone, lisinopril, loratadine, magnesium, omeprazole, ondansetron, ondansetron, propranolol, cholecalciferol, prednisone, sodium chloride, tizanidine, and tramadol, and the following Facility-Administered Medications: dexamethasone, fentanyl, and ropivacaine (pf) 2 mg/ml (0.2%). Her primarily concern today is the Back Pain  (lower)  Initial Vital Signs:  Pulse/HCG Rate: 81ECG Heart Rate: 84 Temp: 97.7 F (36.5 C) Resp: 16 BP: (!) 182/90 SpO2: 100 %  BMI: Estimated body mass index is 21.79 kg/m as calculated from the following:   Height as of this encounter:  (1.676 m).   Weight as of this encounter: 135 lb (61.2 kg).  Risk Assessment: Allergies: Reviewed. She is allergic to lisinopril; peanut oil; gabapentin; nsaids; tramadol; trazodone and nefazodone; and penicillins.  Allergy Precautions: None required Coagulopathies: Reviewed. None identified.  Blood-thinner therapy: None at this time Active Infection(s): Reviewed. None identified. Katrina Holmes is afebrile  Site Confirmation: Katrina Holmes was asked to confirm the procedure and laterality before marking the site Procedure checklist: Completed Consent: Before the procedure and under the influence of no sedative(s), amnesic(s), or anxiolytics, the patient was informed of the treatment options, risks and possible complications. To fulfill our ethical and legal obligations, as recommended by the American Medical Association's Code of Ethics, I have informed the patient of my clinical impression; the nature and purpose of the treatment or procedure; the risks, benefits, and possible complications of the intervention; the alternatives, including doing nothing; the risk(s) and benefit(s) of the alternative treatment(s) or procedure(s); and the risk(s) and benefit(s) of doing nothing. The patient was provided information about the general risks and possible complications associated with the procedure. These may include, but are not limited to: failure to achieve desired goals, infection, bleeding, organ or nerve damage, allergic reactions, paralysis, and death. In addition, the patient was informed of those risks and complications associated to Spine-related procedures, such as failure to decrease pain; infection (i.e.: Meningitis, epidural or intraspinal abscess);  bleeding (i.e.: epidural hematoma, subarachnoid hemorrhage, or any other type of intraspinal or peri-dural bleeding); organ or nerve damage (i.e.: Any type of peripheral nerve, nerve root, or spinal cord injury) with subsequent  damage to sensory, motor, and/or autonomic systems, resulting in permanent pain, numbness, and/or weakness of one or several areas of the body; allergic reactions; (i.e.: anaphylactic reaction); and/or death. Furthermore, the patient was informed of those risks and complications associated with the medications. These include, but are not limited to: allergic reactions (i.e.: anaphylactic or anaphylactoid reaction(s)); adrenal axis suppression; blood sugar elevation that in diabetics may result in ketoacidosis or comma; water retention that in patients with history of congestive heart failure may result in shortness of breath, pulmonary edema, and decompensation with resultant heart failure; weight gain; swelling or edema; medication-induced neural toxicity; particulate matter embolism and blood vessel occlusion with resultant organ, and/or nervous system infarction; and/or aseptic necrosis of one or more joints. Finally, the patient was informed that Medicine is not an exact science; therefore, there is also the possibility of unforeseen or unpredictable risks and/or possible complications that may result in a catastrophic outcome. The patient indicated having understood very clearly. We have given the patient no guarantees and we have made no promises. Enough time was given to the patient to ask questions, all of which were answered to the patient's satisfaction. Katrina Holmes has indicated that she wanted to continue with the procedure. Attestation: I, the ordering provider, attest that I have discussed with the patient the benefits, risks, side-effects, alternatives, likelihood of achieving goals, and potential problems during recovery for the procedure that I have provided informed  consent. Date   Time: 05/05/2019  7:58 AM  Pre-Procedure Preparation:  Monitoring: As per clinic protocol. Respiration, ETCO2, SpO2, BP, heart rate and rhythm monitor placed and checked for adequate function Safety Precautions: Patient was assessed for positional comfort and pressure points before starting the procedure. Time-out: I initiated and conducted the "Time-out" before starting the procedure, as per protocol. The patient was asked to participate by confirming the accuracy of the "Time Out" information. Verification of the correct person, site, and procedure were performed and confirmed by me, the nursing staff, and the patient. "Time-out" conducted as per Joint Commission's Universal Protocol (UP.01.01.01). Time: 0837  Description of Procedure:          Laterality: Left Levels:  L3, L4, L5, & S1 Medial Branch Level(s) Area Prepped: Posterior Lumbosacral Region Prepping solution: DuraPrep (Iodine Povacrylex [0.7% available iodine] and Isopropyl Alcohol, 74% w/w) Safety Precautions: Aspiration looking for blood return was conducted prior to all injections. At no point did we inject any substances, as a needle was being advanced. Before injecting, the patient was told to immediately notify me if she was experiencing any new onset of "ringing in the ears, or metallic taste in the mouth". No attempts were made at seeking any paresthesias. Safe injection practices and needle disposal techniques used. Medications properly checked for expiration dates. SDV (single dose vial) medications used. After the completion of the procedure, all disposable equipment used was discarded in the proper designated medical waste containers. Local Anesthesia: Protocol guidelines were followed. The patient was positioned over the fluoroscopy table. The area was prepped in the usual manner. The time-out was completed. The target area was identified using fluoroscopy. A 12-in long, straight, sterile hemostat was used  with fluoroscopic guidance to locate the targets for each level blocked. Once located, the skin was marked with an approved surgical skin marker. Once all sites were marked, the skin (epidermis, dermis, and hypodermis), as well as deeper tissues (fat, connective tissue and muscle) were infiltrated with a small amount of a short-acting local anesthetic, loaded on a  10cc syringe with a 25G, 1.5-in  Needle. An appropriate amount of time was allowed for local anesthetics to take effect before proceeding to the next step. Local Anesthetic: Lidocaine 2.0% The unused portion of the local anesthetic was discarded in the proper designated containers. Technical explanation of process:   L3 Medial Branch Nerve Block (MBB): The target area for the L3 medial branch is at the junction of the postero-lateral aspect of the superior articular process and the superior, posterior, and medial edge of the transverse process of L4. Under fluoroscopic guidance, a Quincke needle was inserted until contact was made with os over the superior postero-lateral aspect of the pedicular shadow (target area). After negative aspiration for blood, 1mL of the nerve block solution was injected without difficulty or complication. The needle was removed intact. L4 Medial Branch Nerve Block (MBB): The target area for the L4 medial branch is at the junction of the postero-lateral aspect of the superior articular process and the superior, posterior, and medial edge of the transverse process of L5. Under fluoroscopic guidance, a Quincke needle was inserted until contact was made with os over the superior postero-lateral aspect of the pedicular shadow (target area). After negative aspiration for blood, 1 mL of the nerve block solution was injected without difficulty or complication. The needle was removed intact. L5 Medial Branch Nerve Block (MBB): The target area for the L5 medial branch is at the junction of the postero-lateral aspect of the superior  articular process and the superior, posterior, and medial edge of the sacral ala. Under fluoroscopic guidance, a Quincke needle was inserted until contact was made with os over the superior postero-lateral aspect of the pedicular shadow (target area). After negative aspiration for blood, 1mL of the nerve block solution was injected without difficulty or complication. The needle was removed intact. S1 Medial Branch Nerve Block (MBB): The target area for the S1 medial branch is at the posterior and inferior 6 o'clock position of the L5-S1 facet joint. Under fluoroscopic guidance, the Quincke needle inserted for the L5 MBB was redirected until contact was made with os over the inferior and postero aspect of the sacrum, at the 6 o' clock position under the L5-S1 facet joint (Target area). After negative aspiration for blood,221mL of the nerve block solution was injected without difficulty or complication. The needle was removed intact.  Nerve block solution: 10 cc solution made of 8 cc of 0.2% ropivacaine, 2 cc of Decadron 10 mg/cc.  1 to 1.5 cc injected at each level above on left. Total steroid dose: 20 mg of Decadron The unused portion of the solution was discarded in the proper designated containers.  Procedural Needles: 22-gauge, 3.5-inch, Quincke needles used for all levels.  Once the entire procedure was completed, the treated area was cleaned, making sure to leave some of the prepping solution back to take advantage of its long term bactericidal properties.   Illustration of the posterior view of the lumbar spine and the posterior neural structures. Laminae of L2 through S1 are labeled. DPRL5, dorsal primary ramus of L5; DPRS1, dorsal primary ramus of S1; DPR3, dorsal primary ramus of L3; FJ, facet (zygapophyseal) joint L3-L4; I, inferior articular process of L4; LB1, lateral branch of dorsal primary ramus of L1; IAB, inferior articular branches from L3 medial branch (supplies L4-L5 facet joint); IBP,  intermediate branch plexus; MB3, medial branch of dorsal primary ramus of L3; NR3, third lumbar nerve root; S, superior articular process of L5; SAB, superior articular branches from L4 (  supplies L4-5 facet joint also); TP3, transverse process of L3.  Vitals:   05/05/19 0845 05/05/19 0848 05/05/19 0855 05/05/19 0904  BP: (!) 177/94 (!) 199/106 (!) 192/108 (!) 200/100  Pulse:      Resp: Temp:   98.7 F (37.1 C)   TempSrc:      SpO2: 99% 99% 99% 98%  Weight:      Height:         Start Time: 0837 hrs. End Time: 0848 hrs.  Imaging Guidance (Spinal):          Type of Imaging Technique: Fluoroscopy Guidance (Spinal) Indication(s): Assistance in needle guidance and placement for procedures requiring needle placement in or near specific anatomical locations not easily accessible without such assistance. Exposure Time: Please see nurses notes. Contrast: None used. Fluoroscopic Guidance: I was personally present during the use of fluoroscopy. "Tunnel Vision Technique" used to obtain the best possible view of the target area. Parallax error corrected before commencing the procedure. "Direction-depth-direction" technique used to introduce the needle under continuous pulsed fluoroscopy. Once target was reached, antero-posterior, oblique, and lateral fluoroscopic projection used confirm needle placement in all planes. Images permanently stored in EMR. Interpretation: No contrast injected. I personally interpreted the imaging intraoperatively. Adequate needle placement confirmed in multiple planes. Permanent images saved into the patient's record.  Antibiotic Prophylaxis:   Anti-infectives (From admission, onward)   None     Indication(s): None identified  Post-operative Assessment:  Post-procedure Vital Signs:  Pulse/HCG Rate: 8196 Temp: 98.7 F (37.1 C) Resp: 20 BP: (!) 200/100(Dr Aanya Haynes notified) SpO2: 98 %  EBL: None  Complications: No immediate post-treatment  complications observed by team, or reported by patient.  Note: The patient tolerated the entire procedure well. A repeat set of vitals were taken after the procedure and the patient was kept under observation following institutional policy, for this type of procedure. Post-procedural neurological assessment was performed, showing return to baseline, prior to discharge. The patient was provided with post-procedure discharge instructions, including a section on how to identify potential problems. Should any problems arise concerning this procedure, the patient was given instructions to immediately contact us, at any time, without hesitation. In any case, we plan to contact the patient by telephone for a follow-up status report regarding this interventional procedure.  Comments:  No additional relevant information.  Plan of Care  Orders:  Orders Placed This Encounter  Procedures   Fluoro (C-Arm) (<60 min) (No Report)    Intraoperative interpretation by procedural physician at [facility].    Standing Status:   Standing    Number of Occurrences:   1    Order Specific Question:   Reason for exam:    Answer:   Assistance in needle guidance and placement for procedures requiring needle placement in or near specific anatomical locations not easily accessible without such assistance.   Medications ordered for procedure: Meds ordered this encounter  Medications   lidocaine (XYLOCAINE) 2 % (with pres) injection 400 mg   fentaNYL (SUBLIMAZE) injection 25-50 mcg    Make sure Narcan is available in the pyxis when using this medication. In the event of respiratory depression (RR< 8/min): Titrate NARCAN (naloxone) in increments of 0.1 to 0.2 mg IV at 2-3 minute intervals, until desired degree of reversal.   ropivacaine (PF) 2 mg/mL (0.2%) (NAROPIN) injection 9 mL   ropivacaine (PF) 2 mg/mL (0.2%) (NAROPIN) injection 9 mL   dexamethasone (DECADRON) injection 20 mg   dexamethasone  (  DECADRON) injection 10 mg   Patient tolerated procedure well.  She was very pleasant for the majority of her encounter.  In the recovery room, patient inquired about oral opioid analgesics.  I was very clear with the patient regarding her baseline urine toxicology screen as well as her psychological evaluation in which she is deemed moderate to high risk for opioid misuse/abuse.  I informed her that we will be focusing primarily on interventional pain management therapies and that I will not be prescribing her any opioid medications.  Patient became frustrated and wanted to leave early.  BP elevated but patient wanting to leave right away while she is in recovery room since she was told she would not be receiving any opioid analgesics. Diagnostic facet procedures such as this do not warrant opioid therapy for post procedure pain I counseled the patient. Again, this highlights potential drug-seeking behavior as the patient is primarily focused on acquiring opioid medications.   Medications administered: We administered lidocaine, fentaNYL, ropivacaine (PF) 2 mg/mL (0.2%), and dexamethasone.  See the medical record for exact dosing, route, and time of administration.  Follow-up plan:   Return if symptoms worsen or fail to improve, for Post Procedure Evaluation, virtual.      Patient is high risk for opioid misuse/abuse.  Interventional management only.  Previous benefit with lumbar facet blocks with Dr. Metta Clines. Repeat LEFT L3, L4, L5, S1 on 05/05/2019   Recent Visits Date Type Provider Dept  04/24/19 Office Visit Edward Jolly, MD [facility]  Showing recent visits within past 90 days and meeting all other requirements   Today's Visits Date Type Provider Dept  05/05/19 Procedure visit Edward Jolly, MD [facility]  Showing today's visits and meeting all other requirements   Future Appointments No visits were found meeting these conditions.  Showing future appointments within  next 90 days and meeting all other requirements   Disposition: Discharge home  Discharge Date & Time: 05/05/2019;   hrs.   Primary Care Physician: Patient, No Pcp Per Location: [facility] Note by: Edward Jolly, MD Date: 05/05/2019; Time: 11:14 AM  Disclaimer:  Medicine is not an exact science. The only guarantee in medicine is that nothing is guaranteed. It is important to note that the decision to proceed with this intervention was based on the information collected from the patient. The Data and conclusions were drawn from the patient's questionnaire, the interview, and the physical examination. Because the information was provided in large part by the patient, it cannot be guaranteed that it has not been purposely or unconsciously manipulated. Every effort has been made to obtain as much relevant data as possible for this evaluation. It is important to note that the conclusions that lead to this procedure are derived in large part from the available data. Always take into account that the treatment will also be dependent on availability of resources and existing treatment guidelines, considered by other Pain Management Practitioners as being common knowledge and practice, at the time of the intervention. For Medico-Legal purposes, it is also important to point out that variation in procedural techniques and pharmacological choices are the acceptable norm. The indications, contraindications, technique, and results of the above procedure should only be interpreted and judged by a Board-Certified Interventional Pain Specialist with extensive familiarity and expertise in the same exact procedure and technique.

## 2019-05-05 NOTE — Telephone Encounter (Signed)
Patient requested referral to Suboxone.  Provided her information for [facility] at [phone].  Patient also wants her sleep medication increase.  Increase doxepin to 20 mg p.o. nightly as needed.

## 2019-05-05 NOTE — Progress Notes (Signed)
Patient angry because she couldn't get any medications.  STates she is going to leave. States everybody lies about her.  BP elevated.  Dr Holley Raring notified.  States she is not staying any longer. Attempting to calm patient.  States she should have stayed at [facility].  Encouraging patinet to stay.  States she has to go to the bathroom and is leaving.  Unhooked from monitor as requested and taken to bathroom and discharged.  Dr Holley Raring notifed.  Will discharge to husband vehicle.

## 2019-05-06 ENCOUNTER — Telehealth: Payer: Self-pay | Admitting: *Deleted

## 2019-05-06 NOTE — Telephone Encounter (Signed)
No problems post procedure. 

## 2019-05-09 ENCOUNTER — Other Ambulatory Visit: Payer: Self-pay

## 2019-05-09 ENCOUNTER — Encounter: Payer: Self-pay | Admitting: Family Medicine

## 2019-05-09 ENCOUNTER — Ambulatory Visit (INDEPENDENT_AMBULATORY_CARE_PROVIDER_SITE_OTHER): Payer: Self-pay | Admitting: Family Medicine

## 2019-05-09 VITALS — BP 190/100 | HR 86 | Temp 98.4°F | Ht 66.0 in | Wt 140.0 lb

## 2019-05-09 DIAGNOSIS — G8929 Other chronic pain: Secondary | ICD-10-CM

## 2019-05-09 DIAGNOSIS — M47816 Spondylosis without myelopathy or radiculopathy, lumbar region: Secondary | ICD-10-CM

## 2019-05-09 DIAGNOSIS — M4726 Other spondylosis with radiculopathy, lumbar region: Secondary | ICD-10-CM

## 2019-05-09 DIAGNOSIS — I10 Essential (primary) hypertension: Secondary | ICD-10-CM

## 2019-05-09 DIAGNOSIS — H9201 Otalgia, right ear: Secondary | ICD-10-CM

## 2019-05-09 MED ORDER — PREDNISONE 20 MG PO TABS: ORAL_TABLET | ORAL | 0 refills | Status: DC

## 2019-05-09 NOTE — Progress Notes (Signed)
Subjective:    Patient ID: Katrina Holmes, female    DOB: 05/03/1956, 63 y.o.   MRN: [id number]  Katrina Holmes is a 63 y.o. female presenting on 05/09/2019 for Establish Care (Rt ear pain x 1 mth. Pt associate her ear pain with her flonase usage. )  Previous PCP Dr Mariana Kaufman, left Dr Baldwin Crown due to her preference.  HPI   Chronic Pain Syndrome / Orthopedic  Previously was on Pain Management 4 years ago, then came off treatment. Diagnosed by previous PCP and others with lumbar spine degenerative disc disease and sciatica has had multiple bulging discs had MRI in 2019, see below. - Today she reports past 6 months worsening back pain, and numbness into both hips and lower extremity.  - Requests referral now to Neurosurgery - She has seen Pain Management at [facility], Injection done 05/05/19 and limited relief. She is not interested to go back. - She has limited activity, and has had crying spells due to unable to do as much now  CHRONIC HTN: Reports elevated BP often, can be increased due to her significant pain. Current Meds - Propranolol  BID   Reports good compliance, took meds today. Tolerating well, w/o complaints. Lifestyle: - limited Denies CP, dyspnea, HA, edema, dizziness / lightheadedness  Chronic Otalgia, R>L Previously followed by [facility] ENT. 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 offered other meds, she declined only oxycodone they did not rx, refer to PT but she did not complete this and was lost to follow-up.   Depression screen [facility] 2/9 04/24/2019 11/11/2015 09/16/2015  Decreased Interest 0 0 0  Down, Depressed, Hopeless 0 0 0  PHQ - 2 Score 0 0 0  Altered sleeping - - -  Tired, decreased energy - - -  Change in appetite - - -  Feeling bad or failure about yourself  - - -  Trouble concentrating - - -  Moving slowly or fidgety/restless - - -   Suicidal thoughts - - -  PHQ-9 Score - - -  Difficult doing work/chores - - -    Past Medical History:  Diagnosis Date  . Allergy   . Anxiety   . Degenerative disc disease, lumbar   . Depression   . Hyperlipidemia   . Hypertension   . IBS (irritable bowel syndrome) 15 years  . PTSD (post-traumatic stress disorder)   . Sciatica   . Suicide attempt [facility])    Past Surgical History:  Procedure Laterality Date  . ABDOMINAL HYSTERECTOMY    . MANDIBLE SURGERY    . NECK SURGERY  2006  . SHOULDER ARTHROSCOPY WITH ROTATOR CUFF REPAIR Left    3 surgeries (2006-2007-2008)   Social History   Socioeconomic History  . Marital status: Married    Spouse name: robert  . Number of children: 4  . Years of education: Not on file  . Highest education level: Not on file  Occupational History  . Not on file  Social Needs  . Financial resource strain: Not hard at all  . Food insecurity    Worry: Never true    Inability: Never true  . Transportation needs    Medical: No    Non-medical: No  Tobacco Use  . Smoking status: Current Every Day Smoker    Packs/day: 1.00    Types: Cigarettes  . Smokeless tobacco: Never Used  Substance and Sexual Activity  . Alcohol use: No  . Drug use: No  . Sexual activity: Not on file  Lifestyle  . Physical activity    Days per week: 0 days    Minutes per session: 0 min  . Stress: Not on file  Relationships  . Social Musician on phone: Not on file    Gets together: Not on file    Attends religious service: Never    Active member of club or organization: No    Attends meetings of clubs or organizations: Never    Relationship status: Married  . Intimate partner violence    Fear of current or ex partner: No    Emotionally abused: No    Physically abused: No    Forced sexual activity: No  Other Topics Concern  . Not on file  Social History Narrative  . Not on file   Family History  Problem Relation Age of Onset  . Heart disease  Mother   . Stroke Father   . Alcohol abuse Father   . Alcohol abuse Sister   . Alcohol abuse Brother   . Alcohol abuse Sister   . Alcohol abuse Sister    Current Outpatient Medications on File Prior to Visit  Medication Sig  . acetaminophen (TYLENOL) 325 MG tablet Take by mouth.  Marland Kitchen albuterol (VENTOLIN HFA) 108 (90 Base) MCG/ACT inhaler Inhale into the lungs.  Marland Kitchen aspirin EC 81 MG tablet Take 81 mg by mouth daily.  Marland Kitchen dicyclomine (BENTYL) 20 MG tablet Take 20 mg by mouth every 6 (six) hours as needed for spasms.  Marland Kitchen doxepin (SINEQUAN) 10 MG capsule Take 1-2 capsules (10-20 mg total) by mouth at bedtime as needed for up to 15 days. SLEEP  . fluticasone (FLONASE) 50 MCG/ACT nasal spray Place 1 spray into both nostrils daily.  Marland Kitchen loratadine (CLARITIN) 10 MG tablet Take 10 mg by mouth daily.  . Magnesium 100 MG CAPS Take by mouth daily.  Marland Kitchen omeprazole (PRILOSEC) 20 MG capsule Take 20 mg by mouth daily.  . ondansetron (ZOFRAN ODT) 4 MG disintegrating tablet Take 1 tablet (4 mg total) by mouth every 8 (eight) hours as needed for nausea or vomiting.  . propranolol (INDERAL) 10 MG tablet Take 10 mg by mouth 2 (two) times daily.  Marland Kitchen VITAMIN D, CHOLECALCIFEROL, PO Take 100 mg by mouth daily.   No current facility-administered medications on file prior to visit.     Review of Systems Per HPI unless specifically indicated above      Objective:    BP (!) 190/100 (BP Location: Left Arm, Cuff Size: Normal)   Pulse 86   Temp 98.4 F (36.9 C) (Oral)   Ht 5\' 6"  (1.676 m)   Wt 140 lb (63.5 kg)   BMI 22.60 kg/m   Wt Readings from Last 3 Encounters:  05/09/19 140 lb (63.5 kg)  05/05/19 135 lb (61.2 kg)  04/24/19 139 lb (63 kg)    Physical Exam Vitals signs and nursing note reviewed.  Constitutional:      General: She is not in acute distress.    Appearance: She is well-developed. She is not diaphoretic.     Comments: Well-appearing, uncomfortable due to back pain, cooperative  HENT:     Head:  Normocephalic and atraumatic.     Comments: Right ear with significant deformity of TM with some scar tissue Eyes:     General:        Right eye:  No discharge.        Left eye: No discharge.     Conjunctiva/sclera: Conjunctivae normal.  Cardiovascular:     Rate and Rhythm: Normal rate.  Pulmonary:     Effort: Pulmonary effort is normal.  Musculoskeletal:     Comments: Low Back Inspection: Normal appearance, thin body habitus, no spinal deformity, symmetrical. Palpation: tenderness over spinous processes lower spine, and some bilateral paraspinal tenderness w/ spasm. ROM: Limited active ROM forward flex / back extension, rotation L/R due to discomfort Special Testing: Seated SLR positive for radicular pain bilaterally  Strength: Bilateral hip flex/ext 5/5, knee flex/ext 5/5, ankle dorsiflex/plantarflex 5/5 Neurovascular: reduced sensation lower extremity to light touch  Skin:    General: Skin is warm and dry.     Findings: No erythema or rash.  Neurological:     Mental Status: She is alert and oriented to person, place, and time.  Psychiatric:        Behavior: Behavior normal.     Comments: Well groomed, good eye contact, normal speech and thoughts       2020 September MRI Lumbar Spine Wo Contrast9/22/2020 [facility] Result Impression   Multilevel lumbar degenerative disc disease, minimally progressed from 09/2013 and detailed above. This includes moderate bilateral neural foraminal narrowing at L5-S1.  Result Narrative  EXAM: Magnetic resonance imaging, spinal canal and contents, lumbar, without contrast material. DATE: 03/11/2019 1:33 PM ACCESSION: [id number] UN DICTATED: 03/11/2019 2:30 PM INTERPRETATION LOCATION: [facility]  CLINICAL INDICATION: 63 years old Female with left L5/S1 radicular pain - M54.17 - Left lumbosacral radiculopathy - M54.5 - Low back pain of over 3 months duration   COMPARISON: MRI lumbar spine 09/17/2013.  TECHNIQUE: Multiplanar MRI was  performed through the lumbar spine without intravenous contrast.  FINDINGS:  For the purposes of this dictation, the lowest well formed intervertebral disc space is assumed to be the L5-S1 level, and there are presumed to be five lumbar-type vertebral bodies.  Normal lumbar lordosis is observed. Vertebral body heights are maintained. The signal intensity throughout the bone marrow is within normal limits. The conus medullaris ends at a normal level in the visualized spinal cord is unremarkable. Multilevel disc desiccation is present, progressed from 2015.  L1-L2: No significant spinal canal stenosis and neural foraminal narrowing.  L2-L3: No significant spinal canal stenosis or neural foraminal narrowing. Mild ligament of flavum thickening.  L3-L4: Circumferential disc bulge with thickening of the ligamentum flavum. Mild canal stenosis and mild left neural foraminal narrowing, progressed from 2015. No right neural foraminal narrowing.  L4-L5: Circumferential disc bulge and facet arthrosis. Mild spinal canal stenosis and mild to moderate bilateral neural foraminal narrowing, minimally progressed from 2015.  L5-S1: Posterior disc bulge with facet arthrosis. No significant spinal canal or lateral recess stenosis. Moderate bilateral neural foraminal narrowing, minimally progressed from 2015.  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  Other Result Information  Interface, Rad Results In - 03/11/2019  2:41 PM EDT EXAM: Magnetic resonance imaging, spinal canal and contents, lumbar, without contrast material. DATE: 03/11/2019 1:33 PM ACCESSION: [id number] UN DICTATED: 03/11/2019 2:30 PM INTERPRETATION LOCATION: [facility]  CLINICAL INDICATION: 63 years old Female with left L5/S1 radicular pain  - M54.17 - Left lumbosacral radiculopathy - M54.5 - Low back pain of over 3 months duration    COMPARISON: MRI lumbar spine 09/17/2013.  TECHNIQUE: Multiplanar MRI was  performed through the lumbar spine without intravenous contrast.  FINDINGS:  For the purposes of this dictation,  the lowest well formed intervertebral disc space is assumed to be the L5-S1 level, and there are presumed to be five lumbar-type vertebral bodies.  Normal lumbar lordosis is observed. Vertebral body heights are maintained. The signal intensity throughout the bone marrow is within normal limits. The conus medullaris ends at a normal level in the visualized spinal cord is unremarkable. Multilevel disc desiccation is present, progressed from 2015.  L1-L2: No significant spinal canal stenosis and neural foraminal narrowing.  L2-L3: No significant spinal canal stenosis or neural foraminal narrowing. Mild ligament of flavum thickening.  L3-L4: Circumferential disc bulge with thickening of the ligamentum flavum. Mild canal stenosis and mild left neural foraminal narrowing, progressed from 2015. No right neural foraminal narrowing.  L4-L5: Circumferential disc bulge and facet arthrosis. Mild spinal canal stenosis and mild to moderate bilateral neural foraminal narrowing, minimally progressed from 2015.  L5-S1: Posterior disc bulge with facet arthrosis. No significant spinal canal or lateral recess stenosis. Moderate bilateral neural foraminal  narrowing, minimally progressed from 2015.  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  IMPRESSION:  Multilevel lumbar degenerative disc disease, minimally progressed from 09/2013 and detailed above. This includes moderate bilateral neural foraminal narrowing at L5-S1.      Results for orders placed or performed in visit on 04/24/19  UDS (Comprehensive-24) ([employer]) ([facility]) (New Pt.)  Result Value Ref Range   Summary Note       Assessment & Plan:   Problem List Items Addressed This Visit    Lumbar facet arthropathy   Relevant Medications   predniSONE (DELTASONE) 20 MG tablet   Other Relevant Orders    Ambulatory referral to Neurosurgery   HTN (hypertension)    Other Visit Diagnoses    Osteoarthritis of spine with radiculopathy, lumbar region    -  Primary   Relevant Medications   predniSONE (DELTASONE) 20 MG tablet   Other Relevant Orders   Ambulatory referral to Neurosurgery   Chronic ear pain, right       Relevant Medications   predniSONE (DELTASONE) 20 MG tablet      #HTN Significantly elevated due to acute pain with back and also some stress/anxiety Advised she could go to hospital for evaluation acutely - declined Offered new rx for BP - she declined Will pursue treating back pain with prednisone, caution can elevated blood pressure, other precautions given, follow-up return to care when to go to hospital ED  #Chronic Ear Pain [facility], advised her to return to them for further care, given this complex problem, I do not have much else to offer, she will need specialty services, and they gave her dx of TMJ referred pain and referred to appropriate PT, but she left after not receiving opioid therapy.  #Chronic back pain, DJD, radiculopathy Reviewed last MRI 2019, see results above Worsening chronic problem, now with neurologic complications impingement Ultimately limited treatment options I have for her at this time in primary care, she has been managed on chronic opioids in past, and has tried other medications with limited relief - She had [facility] Pain Management specialist eval and trial on ESI injection without good results, she states she is not as interested in more injections, I asked her to follow back up with them to let them know that the initial injection was unsuccessful and to discuss next options - In addition will place [facility] for other procedural evaluation  Rx Prednisone dose pak taper over 7 days  Declined to rx opioids on visit today, has significant risk factors on her history regarding prior opioid misuse.    Meds ordered this encounter  Medications  . predniSONE (DELTASONE) 20 MG tablet    Sig: Take daily with food. Start with 60mg  (3 pills) x 2 days, then reduce to 40mg  (2 pills) x 2 days, then 20mg  (1 pill) x 3 days    Dispense:  13 tablet    Refill:  0   Orders Placed This Encounter  Procedures  . Ambulatory referral to Neurosurgery    Referral Priority:   Routine    Referral Type:   Surgical    Referral Reason:   Specialty Services Required    Requested Specialty:   Neurosurgery    Number of Visits Requested:   1     Follow up plan: Return in about 4 weeks (around 06/06/2019) for back pain / BP.  Nobie Putnam, DO [facility] 05/09/2019, 4:07 PM

## 2019-05-09 NOTE — Patient Instructions (Addendum)
Thank you for coming to the office today.  Propranolol can increase to 2 pills twice a day for now. If elevated BP >180/100 can take the additional dose.  Notify Dr Holley Raring about limited improvement on back injection.  Referral - stay tuned for apt [place] [facility] at [facility]) / Surgery performed at [address] [place], [place] [address] Ph - [phone] (to refer)  Start steroid taper over next 7 days for now, can extend slightly if needed.  Follow-up with [facility] for Ear pain.  Please schedule a Follow-up Appointment to: Return in about 4 weeks (around 06/06/2019) for back pain / BP.  If you have any other questions or concerns, please feel free to call the office or send a message through [place]. You may also schedule an earlier appointment if necessary.  Additionally, you may be receiving a survey about your experience at our office within a few days to 1 week by e-mail or mail. We value your feedback.  Nobie Putnam, DO [place]

## 2019-05-12 ENCOUNTER — Other Ambulatory Visit: Payer: Self-pay | Admitting: Psychiatry

## 2019-05-12 DIAGNOSIS — F4310 Post-traumatic stress disorder, unspecified: Secondary | ICD-10-CM

## 2019-05-12 DIAGNOSIS — F411 Generalized anxiety disorder: Secondary | ICD-10-CM

## 2019-05-12 DIAGNOSIS — F3160 Bipolar disorder, current episode mixed, unspecified: Secondary | ICD-10-CM

## 2019-05-13 ENCOUNTER — Emergency Department
Admission: EM | Admit: 2019-05-13 | Discharge: 2019-05-13 | Disposition: A | Discharge status: Home / Self Care | Payer: BLUE CROSS/BLUE SHIELD | Attending: Emergency Medicine | Admitting: Emergency Medicine

## 2019-05-13 ENCOUNTER — Other Ambulatory Visit: Payer: Self-pay

## 2019-05-13 ENCOUNTER — Telehealth: Payer: Self-pay

## 2019-05-13 ENCOUNTER — Telehealth: Payer: Self-pay | Admitting: Student in an Organized Health Care Education/Training Program

## 2019-05-13 DIAGNOSIS — Z79899 Other long term (current) drug therapy: Secondary | ICD-10-CM | POA: Diagnosis not present

## 2019-05-13 DIAGNOSIS — Z7982 Long term (current) use of aspirin: Secondary | ICD-10-CM | POA: Insufficient documentation

## 2019-05-13 DIAGNOSIS — I10 Essential (primary) hypertension: Secondary | ICD-10-CM | POA: Diagnosis not present

## 2019-05-13 DIAGNOSIS — M549 Dorsalgia, unspecified: Secondary | ICD-10-CM | POA: Diagnosis not present

## 2019-05-13 DIAGNOSIS — F17210 Nicotine dependence, cigarettes, uncomplicated: Secondary | ICD-10-CM | POA: Insufficient documentation

## 2019-05-13 DIAGNOSIS — G8929 Other chronic pain: Secondary | ICD-10-CM

## 2019-05-13 DIAGNOSIS — Z91010 Allergy to peanuts: Secondary | ICD-10-CM | POA: Insufficient documentation

## 2019-05-13 MED ORDER — METHYLPREDNISOLONE 4 MG PO TBPK: ORAL_TABLET | ORAL | 0 refills | Status: DC

## 2019-05-13 MED ORDER — CYCLOBENZAPRINE HCL 10 MG PO TABS: 10.0000 mg | ORAL_TABLET | Freq: Three times a day (TID) | ORAL | 0 refills | Status: DC | PRN

## 2019-05-13 MED ORDER — HYDROMORPHONE HCL 1 MG/ML IJ SOLN
1.0000 mg | Freq: Once | INTRAMUSCULAR | Status: AC
  Administered 2019-05-13: 1 mg via INTRAMUSCULAR
  Filled 2019-05-13: qty 1

## 2019-05-13 MED ORDER — LIDOCAINE 5 % EX PTCH: 1.0000 | MEDICATED_PATCH | Freq: Two times a day (BID) | CUTANEOUS | 0 refills | Status: DC

## 2019-05-13 MED ORDER — ORPHENADRINE CITRATE 30 MG/ML IJ SOLN
60.0000 mg | Freq: Two times a day (BID) | INTRAMUSCULAR | Status: DC
  Administered 2019-05-13: 60 mg via INTRAMUSCULAR
  Filled 2019-05-13: qty 2

## 2019-05-13 MED ORDER — LIDOCAINE 5 % EX PTCH
1.0000 | MEDICATED_PATCH | CUTANEOUS | Status: DC
  Administered 2019-05-13: 1 via TRANSDERMAL
  Filled 2019-05-13: qty 1

## 2019-05-13 NOTE — Telephone Encounter (Signed)
Had injections on 15th now she is in a lot of pain and she is in ED. They told her to call pain doctor for help with this pain. What should she do.

## 2019-05-13 NOTE — Discharge Instructions (Signed)
Continue previous medication and follow-up with schedule neurology/pain management.  Take medication as directed.

## 2019-05-13 NOTE — Telephone Encounter (Signed)
pt called states she would like a refill on the fluoxatine 40mg  and the olanzapine 7.5mg 

## 2019-05-13 NOTE — Telephone Encounter (Signed)
Patient had stopped all her medications reporting side effects or made her symptoms worse. Please ask her to schedule an appointment to be seen. I do not want to restart her on medications without an evaluation .

## 2019-05-13 NOTE — ED Provider Notes (Signed)
[facility] Emergency Department Provider Note   ____________________________________________   First MD Initiated Contact with Patient 05/13/19 [phone]     (approximate)  I have reviewed the triage vital signs and the nursing notes.   HISTORY  Chief Complaint Back Pain    HPI Katrina Holmes is a 63 y.o. female patient presents with chronic back pain for 3 years.  Patient denies radicular component to the left lower extremity.  Patient denies bladder bowel dysfunction.  Patient states she had epidural injection last week the last approximate 4 days.  Patient did pain returned.  Patient is not contacted treating doctor in the room noted steroid injection is no longer working.  Patient state only narcotic pain medication works for complaint.  Review of patient records show she is scheduled for MRI of the lumbar spine.  Patient relates she has been referred to neurologist/neurosurgeon for definitive evaluation.  Patient rates her pain as 8/10.  Patient described the pain as "sharp".         Past Medical History:  Diagnosis Date  . Allergy   . Anxiety   . Degenerative disc disease, lumbar   . Depression   . Hyperlipidemia   . Hypertension   . IBS (irritable bowel syndrome) 15 years  . PTSD (post-traumatic stress disorder)   . Sciatica   . Suicide attempt [facility])     Patient Active Problem List   Diagnosis Date Noted  . Alcohol use disorder, moderate, in sustained remission (HCC) 04/29/2019  . Cannabis use disorder, severe, in sustained remission (HCC) 04/29/2019  . Noncompliance with medication regimen 04/29/2019  . Evaluation by psychiatric service required 04/29/2019  . Mixed bipolar I disorder (HCC) 01/27/2019  . PTSD (post-traumatic stress disorder) 01/27/2019  . GAD (generalized anxiety disorder) 01/27/2019  . Radial nerve palsy, unspecified laterality 11/21/2018  . Encounter for tobacco use cessation counseling 11/12/2018  . Chronic midline  low back pain with left-sided sciatica 05/22/2018  . Irritable bowel syndrome with diarrhea 05/22/2018  . Seasonal allergies 05/22/2018  . Anxiety 08/01/2017  . Cervical postlaminectomy syndrome 08/01/2017  . Lumbar facet arthropathy 08/01/2017  . Opioid use disorder, moderate, dependence (HCC) 01/15/2017  . Sedative, hypnotic or anxiolytic use disorder, severe, dependence (HCC) 01/15/2017  . Tobacco use disorder 01/12/2017  . HTN (hypertension) 01/11/2017  . Severe recurrent major depression without psychotic features (HCC) 12/29/2015  . DDD (degenerative disc disease), cervical 05/25/2015  . 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 DDD (degenerative disc disease), lumbar 05/25/2015  . Major depressive disorder 10/17/2013  . Gastroesophageal reflux disease without esophagitis 10/03/2012  . Hyperlipidemia 10/03/2012    Past Surgical History:  Procedure Laterality Date  . ABDOMINAL HYSTERECTOMY    . MANDIBLE SURGERY    . NECK SURGERY  2006  . SHOULDER ARTHROSCOPY WITH ROTATOR CUFF REPAIR Left    3 surgeries (2006-2007-2008)    Prior to Admission medications   Medication Sig Start Date End Date Taking? Authorizing Provider  acetaminophen (TYLENOL) 325 MG tablet Take by mouth.    [provider]  albuterol (VENTOLIN HFA) 108 (90 Base) MCG/ACT inhaler Inhale into the lungs. 01/10/19 01/10/20  [provider]  aspirin EC 81 MG tablet Take 81 mg by mouth daily.    [provider]  cyclobenzaprine (FLEXERIL) 10 MG tablet Take 1 tablet (10 mg total) by mouth 3 (three) times daily as needed. 05/13/19   Joni Reining, PA-C  dicyclomine (BENTYL) 20 MG tablet Take 20 mg by mouth  every 6 (six) hours as needed for spasms. 08/12/18 08/13/19  [provider]  doxepin (SINEQUAN) 10 MG capsule Take 1-2 capsules (10-20 mg total) by mouth at bedtime as needed for up to 15 days. SLEEP 05/05/19 05/20/19  Jomarie LongsEappen, Saramma, MD  fluticasone (FLONASE) 50 MCG/ACT nasal spray Place 1  spray into both nostrils daily. 09/16/17   [provider]  lidocaine (LIDODERM) 5 % Place 1 patch onto the skin every 12 (twelve) hours. Remove & Discard patch within 12 hours or as directed by MD 05/13/19 05/12/20  Joni ReiningSmith, Ronald K, PA-C  loratadine (CLARITIN) 10 MG tablet Take 10 mg by mouth daily.    [provider]  Magnesium 100 MG CAPS Take by mouth daily.    [provider]  methylPREDNISolone (MEDROL DOSEPAK) 4 MG TBPK tablet Take Tapered dose as directed 05/13/19   Joni ReiningSmith, Ronald K, PA-C  omeprazole (PRILOSEC) 20 MG capsule Take 20 mg by mouth daily. 10/14/17   [provider]  ondansetron (ZOFRAN ODT) 4 MG disintegrating tablet Take 1 tablet (4 mg total) by mouth every 8 (eight) hours as needed for nausea or vomiting. 03/09/19   Minna AntisPaduchowski, Kevin, MD  predniSONE (DELTASONE) 20 MG tablet Take daily with food. Start with 60mg  (3 pills) x 2 days, then reduce to 40mg  (2 pills) x 2 days, then 20mg  (1 pill) x 3 days 05/09/19   Smitty CordsKaramalegos, Alexander J, DO  propranolol (INDERAL) 10 MG tablet Take 10 mg by mouth 2 (two) times daily. 04/09/19   [provider]  VITAMIN D, CHOLECALCIFEROL, PO Take 100 mg by mouth daily.    [provider]    Allergies Lisinopril, Peanut oil, Gabapentin, Nsaids, Tramadol, Trazodone and nefazodone, and Penicillins  Family History  Problem Relation Age of Onset  . Heart disease Mother   . Stroke Father   . Alcohol abuse Father   . Alcohol abuse Sister   . Alcohol abuse Brother   . Alcohol abuse Sister   . Alcohol abuse Sister     Social History Social History   Tobacco Use  . Smoking status: Current Every Day Smoker    Packs/day: 1.00    Types: Cigarettes  . Smokeless tobacco: Never Used  Substance Use Topics  . Alcohol use: No  . Drug use: No    Review of Systems Constitutional: No fever/chills Eyes: No visual changes. ENT: No sore throat. Cardiovascular: Denies chest pain. Respiratory:  Denies shortness of breath. Gastrointestinal: No abdominal pain.  No nausea, no vomiting.  No diarrhea.  No constipation. Genitourinary: Negative for dysuria. Musculoskeletal: Positive for chronic back pain. Skin: Negative for rash. Neurological: Negative for headaches, focal weakness or numbness. Psychiatric:  Anxiety, alcohol use disorder, cannabis use disorder, mixed bipolar, opiate use dependency, and PTSD. Endocrine:  Hyperlipidemia and hypertension. Allergic/Immunilogical: Lisinopril, peanut oil, gabapentin, NSAIDs, tramadol, trazodone, and penicillin. ____________________________________________   PHYSICAL EXAM:  VITAL SIGNS: ED Triage Vitals  Enc Vitals Group     BP 05/13/19 0858 (!) 175/85     Pulse Rate 05/13/19 0858 97     Resp 05/13/19 0858 18     Temp 05/13/19 0858 98 F (36.7 C)     Temp Source 05/13/19 0858 Oral     SpO2 05/13/19 0858 100 %     Weight 05/13/19 0859 140 lb (63.5 kg)     Height 05/13/19 0859 5\' 6"  (1.676 m)     Head Circumference --      Peak Flow --  Pain Score 05/13/19 0859 8     Pain Loc --      Pain Edu? --      Excl. in [place]? --    Constitutional: Alert and oriented. Well appearing and in no acute distress. Cardiovascular: Normal rate, regular rhythm. Grossly normal heart sounds.  Good peripheral circulation. Respiratory: Normal respiratory effort.  No retractions. Lungs CTAB. Gastrointestinal: Soft and nontender. No distention. No abdominal bruits. No CVA tenderness. Genitourinary: Deferred Musculoskeletal: No obvious spinal deformity.  No guarding palpation spinal processes.  Negative bilateral straight leg test.  Neurologic:  Normal speech and language. No gross focal neurologic deficits are appreciated. No gait instability. Skin:  Skin is warm, dry and intact. No rash noted.  No abrasions or ecchymosis. Psychiatric: Mood and affect are normal. Speech and behavior are normal.  ____________________________________________   LABS (all  labs ordered are listed, but only abnormal results are displayed)  Labs Reviewed - No data to display ____________________________________________  EKG   ____________________________________________  RADIOLOGY  ED MD interpretation:    Official radiology report(s): No results found.  ____________________________________________   PROCEDURES  Procedure(s) performed (including Critical Care):  Procedures   ____________________________________________   INITIAL IMPRESSION / ASSESSMENT AND PLAN / ED COURSE  As part of my medical decision making, I reviewed the following data within the [place]     Patient request pain medication for chronic back pain.  Discussed rationale for not prescribing narcotic pain medication.  Patient given a prescription for Lidoderm patches Medrol Dosepak, and Flexeril.  Advised to follow-up with pain management clinic and pending neurology appointment.    Julianne Chamberlin Bress was evaluated in Emergency Department on 05/13/2019 for the symptoms described in the history of present illness. She was evaluated in the context of the global COVID-19 pandemic, which necessitated consideration that the patient might be at risk for infection with the SARS-CoV-2 virus that causes COVID-19. Institutional protocols and algorithms that pertain to the evaluation of patients at risk for COVID-19 are in a state of rapid change based on information released by regulatory bodies including the CDC and federal and state organizations. These policies and algorithms were followed during the patient's care in the ED.       ____________________________________________   FINAL CLINICAL IMPRESSION(S) / ED DIAGNOSES  Final diagnoses:  Chronic midline back pain, unspecified back location     ED Discharge Orders         Ordered    lidocaine (LIDODERM) 5 %  Every 12 hours     05/13/19 0946    cyclobenzaprine (FLEXERIL) 10 MG tablet  3 times daily  PRN     05/13/19 0946    methylPREDNISolone (MEDROL DOSEPAK) 4 MG TBPK tablet     05/13/19 7106           Note:  This document was prepared using Dragon voice recognition software and may include unintentional dictation errors.    Sable Feil, PA-C 05/13/19 2694    Blake Divine, MD 05/14/19 1725

## 2019-05-13 NOTE — Telephone Encounter (Signed)
Pt was called and given the information. Pt was then transferred to the front desk to make a sooner appt to be seen.

## 2019-05-13 NOTE — ED Triage Notes (Signed)
Pt reports she is here for chronic back pain and leg pain. She had injections at pain clinic last week with no relief. Does not take any prescription pain medications.  Pt alert and oriented X4, cooperative, RR even and unlabored, color WNL. Pt in NAD.

## 2019-05-13 NOTE — Telephone Encounter (Signed)
Called patient back to let her know how Dr Holley Raring responded to her request.  I did let her know that based on their last encounter he no longer feels that there is a level of trust and does not feel that he can continue to treat her.  He recommends that she could go to [facility] or somewhere in [place].  I am not sure if she heard this information as patient hung up on the call.

## 2019-05-13 NOTE — Telephone Encounter (Signed)
Patient reports that she had procedure on 05/05/19, Monday and pain relief was good up until that Thursday and it all started coming back.  Patient reports that she had to go to the ED this morning d/t the pain being so bad.  She reports that they gave her an injection of dilaudid and prescribed lidoderm patches, flexeril tid and a medrol dosepack.  They also told her to f/up with her pain Dr.  Quintella Baton that the injection did help her some and she hasn't had the other Rx's filled.  Patient wanted to let us know and see what she needs to do.

## 2019-05-14 ENCOUNTER — Institutional Professional Consult (permissible substitution): Payer: BLUE CROSS/BLUE SHIELD | Admitting: Psychiatry

## 2019-05-14 ENCOUNTER — Encounter

## 2019-05-14 ENCOUNTER — Ambulatory Visit: Payer: BLUE CROSS/BLUE SHIELD | Admitting: Psychiatry

## 2019-05-23 ENCOUNTER — Ambulatory Visit: Payer: BLUE CROSS/BLUE SHIELD | Admitting: Licensed Clinical Social Worker

## 2019-05-27 ENCOUNTER — Other Ambulatory Visit: Payer: Self-pay

## 2019-05-27 ENCOUNTER — Ambulatory Visit (INDEPENDENT_AMBULATORY_CARE_PROVIDER_SITE_OTHER): Payer: BLUE CROSS/BLUE SHIELD | Admitting: Psychiatry

## 2019-05-27 ENCOUNTER — Encounter: Payer: Self-pay | Admitting: Psychiatry

## 2019-05-27 DIAGNOSIS — F411 Generalized anxiety disorder: Secondary | ICD-10-CM

## 2019-05-27 DIAGNOSIS — F3160 Bipolar disorder, current episode mixed, unspecified: Secondary | ICD-10-CM | POA: Diagnosis not present

## 2019-05-27 DIAGNOSIS — F1221 Cannabis dependence, in remission: Secondary | ICD-10-CM

## 2019-05-27 DIAGNOSIS — F4310 Post-traumatic stress disorder, unspecified: Secondary | ICD-10-CM

## 2019-05-27 DIAGNOSIS — F1110 Opioid abuse, uncomplicated: Secondary | ICD-10-CM | POA: Diagnosis not present

## 2019-05-27 DIAGNOSIS — F1021 Alcohol dependence, in remission: Secondary | ICD-10-CM

## 2019-05-27 DIAGNOSIS — F17200 Nicotine dependence, unspecified, uncomplicated: Secondary | ICD-10-CM

## 2019-05-27 MED ORDER — FLUOXETINE HCL 20 MG PO CAPS: 20.0000 mg | ORAL_CAPSULE | Freq: Every day | ORAL | 1 refills | Status: DC

## 2019-05-27 NOTE — Progress Notes (Signed)
Virtual Visit via Video Note  I connected with Katrina Holmes on 05/27/19 at  3:15 PM EST by a video enabled telemedicine application and verified that I am speaking with the correct person using two identifiers.   I discussed the limitations of evaluation and management by telemedicine and the availability of in person appointments. The patient expressed understanding and agreed to proceed.    I discussed the assessment and treatment plan with the patient. The patient was provided an opportunity to ask questions and all were answered. The patient agreed with the plan and demonstrated an understanding of the instructions.   The patient was advised to call back or seek an in-person evaluation if the symptoms worsen or if the condition fails to improve as anticipated.   Katrina Creek MD  OP Progress Note  05/27/2019 6:12 PM Katrina Holmes  MRN:  [id number]  Chief Complaint:  Chief Complaint    Follow-up     HPI: Katrina Holmes is a 63 year old Caucasian female, married, currently lives in [place], has a history of bipolar disorder, PTSD, GAD, benzodiazepine dependence currently in remission, tobacco use disorder, degenerative disc disease, opioid use disorder, hyperlipidemia, gastroesophageal reflux disease, alcohol use disorder in remission was evaluated by telemedicine today.  Patient today reports she is currently in a program for Suboxone treatment.  She reports the program is going well.  She was started on Suboxone recently and she is tolerating it well.  Patient reports mood wise she is having anxiety as well as mood swings and would like to go back on her medications.  She reports initially she had thought that the Trileptal was giving her adverse side effects.  She however reports she was able to note that her lisinopril is what caused her side effects and not the Trileptal.  Patient reports she also struggles with sleep however the doxepin does help at low dosage.  When she tried to go up on  the doxepin it made her groggy.  She hence wants to stay on a low dosage and take it only as needed.  She reports she would like to get back on her Prozac and see if that will help her.  Patient denies any suicidality, homicidality or perceptual disturbances.  Patient denies any other concerns today.   Visit Diagnosis:    ICD-10-CM   1. Mixed bipolar I disorder (HCC)  F31.60 FLUoxetine (PROZAC) 20 MG capsule   mild  2. PTSD (post-traumatic stress disorder)  F43.10 FLUoxetine (PROZAC) 20 MG capsule  3. GAD (generalized anxiety disorder)  F41.1   4. Opioid use disorder, mild, in controlled environment ([place])  F11.10    on suboxone  5. Tobacco use disorder  F17.200   6. Alcohol use disorder, moderate, in sustained remission (HCC)  F10.21   7. Cannabis use disorder, severe, in sustained remission ([place])  F12.21     Past Psychiatric History: Reviewed past psychiatric history from my progress note on 01/27/2019.  Past trials of BuSpar-gave her side effects, Zyprexa, clonazepam, Prozac, Trileptal-noncompliant, doxepin  Past Medical History:  Past Medical History:  Diagnosis Date  . Allergy   . Anxiety   . Degenerative disc disease, lumbar   . Depression   . Hyperlipidemia   . Hypertension   . IBS (irritable bowel syndrome) 15 years  . PTSD (post-traumatic stress disorder)   . Sciatica   . Suicide attempt [facility])     Past Surgical History:  Procedure Laterality Date  . ABDOMINAL HYSTERECTOMY    . MANDIBLE SURGERY    .  NECK SURGERY  2006  . SHOULDER ARTHROSCOPY WITH ROTATOR CUFF REPAIR Left    3 surgeries (2006-2007-2008)    Family Psychiatric History: I have reviewed family psychiatric history from my progress note on 01/27/2019  Family History:  Family History  Problem Relation Age of Onset  . Heart disease Mother   . Stroke Father   . Alcohol abuse Father   . Alcohol abuse Sister   . Alcohol abuse Brother   . Alcohol abuse Sister   . Alcohol abuse Sister     Social  History: Reviewed social history from my progress note on 01/27/2019. Social History   Socioeconomic History  . Marital status: Married    Spouse name: robert  . Number of children: 4  . Years of education: Not on file  . Highest education level: Not on file  Occupational History  . Not on file  Social Needs  . Financial resource strain: Not hard at all  . Food insecurity    Worry: Never true    Inability: Never true  . Transportation needs    Medical: No    Non-medical: No  Tobacco Use  . Smoking status: Current Every Day Smoker    Packs/day: 1.00    Types: Cigarettes  . Smokeless tobacco: Never Used  Substance and Sexual Activity  . Alcohol use: No  . Drug use: No  . Sexual activity: Not on file  Lifestyle  . Physical activity    Days per week: 0 days    Minutes per session: 0 min  . Stress: Not on file  Relationships  . Social Musician on phone: Not on file    Gets together: Not on file    Attends religious service: Never    Active member of club or organization: No    Attends meetings of clubs or organizations: Never    Relationship status: Married  Other Topics Concern  . Not on file  Social History Narrative  . Not on file    Allergies:  Allergies  Allergen Reactions  . Lisinopril Nausea Only and Swelling    Dehydration Tongue swelling Dehydration Tongue swelling   . Peanut Oil Other (See Comments)    Admitted for anaphylaxis on 01/11/17 after reporting exposure to peanut butter per outside records. Admitted for anaphylaxis on 01/11/17 after reporting exposure to peanut butter per outside records. Admitted for anaphylaxis on 01/11/17 after reporting exposure to peanut butter per outside records.   . Gabapentin Other (See Comments)    Bloating  Bloating  . Nsaids Nausea And Vomiting  . Tramadol Nausea And Vomiting  . Trazodone And Nefazodone Diarrhea    And hallucinations   . Penicillins Rash    .Has patient had a PCN reaction causing  immediate rash, facial/tongue/throat swelling, SOB or lightheadedness with hypotension: Unknown Has patient had a PCN reaction causing severe rash involving mucus membranes or skin necrosis: Unknown Has patient had a PCN reaction that required hospitalization: Unknown Has patient had a PCN reaction occurring within the last 10 years: Unknown If all of the above answers are "NO", then may proceed with Cephalosporin use.     Metabolic Disorder Labs: Lab Results  Component Value Date   HGBA1C 5.5 01/13/2017   MPG 111 01/13/2017   No results found for: PROLACTIN Lab Results  Component Value Date   CHOL 237 (H) 01/13/2017   TRIG 269 (H) 01/13/2017   HDL 52 01/13/2017   CHOLHDL 4.6 01/13/2017   VLDL 54 (  H) 01/13/2017   LDLCALC 131 (H) 01/13/2017   Lab Results  Component Value Date   TSH 3.540 01/13/2017    Therapeutic Level Labs: No results found for: LITHIUM No results found for: VALPROATE No components found for:  CBMZ  Current Medications: Current Outpatient Medications  Medication Sig Dispense Refill  . acetaminophen (TYLENOL) 325 MG tablet Take by mouth.    Marland Kitchen albuterol (VENTOLIN HFA) 108 (90 Base) MCG/ACT inhaler Inhale into the lungs.    Marland Kitchen aspirin EC 81 MG tablet Take 81 mg by mouth daily.    . cyclobenzaprine (FLEXERIL) 10 MG tablet Take 1 tablet (10 mg total) by mouth 3 (three) times daily as needed. 15 tablet 0  . dicyclomine (BENTYL) 20 MG tablet Take 20 mg by mouth every 6 (six) hours as needed for spasms.    Marland Kitchen FLUoxetine (PROZAC) 20 MG capsule Take 1 capsule (20 mg total) by mouth daily. 30 capsule 1  . fluticasone (FLONASE) 50 MCG/ACT nasal spray Place 1 spray into both nostrils daily.  1  . lidocaine (LIDODERM) 5 % Place 1 patch onto the skin every 12 (twelve) hours. Remove & Discard patch within 12 hours or as directed by MD 10 patch 0  . loratadine (CLARITIN) 10 MG tablet Take 10 mg by mouth daily.    . Magnesium 100 MG CAPS Take by mouth daily.    .  methylPREDNISolone (MEDROL DOSEPAK) 4 MG TBPK tablet Take Tapered dose as directed 21 tablet 0  . omeprazole (PRILOSEC) 20 MG capsule Take 20 mg by mouth daily.  0  . ondansetron (ZOFRAN ODT) 4 MG disintegrating tablet Take 1 tablet (4 mg total) by mouth every 8 (eight) hours as needed for nausea or vomiting. 20 tablet 0  . predniSONE (DELTASONE) 20 MG tablet Take daily with food. Start with 60mg  (3 pills) x 2 days, then reduce to 40mg  (2 pills) x 2 days, then 20mg  (1 pill) x 3 days 13 tablet 0  . propranolol (INDERAL) 10 MG tablet Take 10 mg by mouth 2 (two) times daily.    VITAMIN D, CHOLECALCIFEROL, PO Take 100 mg by mouth daily.     No current facility-administered medications for this visit.      Musculoskeletal: Strength & Muscle Tone: UTA Gait & Station: normal Patient leans: N/A  Psychiatric Specialty Exam: Review of Systems  Psychiatric/Behavioral: Positive for substance abuse (currently on suboxone). The patient is nervous/anxious.   All other systems reviewed and are negative.   There were no vitals taken for this visit.There is no height or weight on file to calculate BMI.  General Appearance: Casual  Eye Contact:  Fair  Speech:  Clear and Coherent  Volume:  Normal  Mood:  Anxious  Affect:  Congruent  Thought Process:  Goal Directed and Descriptions of Associations: Intact  Orientation:  Full (Time, Place, and Person)  Thought Content: Logical   Suicidal Thoughts:  No  Homicidal Thoughts:  No  Memory:  Immediate;   Fair Recent;   Fair Remote;   Fair  Judgement:  Fair  Insight:  Fair  Psychomotor Activity:  Normal  Concentration:  Concentration: Fair and Attention Span: Fair  Recall:  of Knowledge: Fair  Language: Fair  Akathisia:  No  Handed:  Right  AIMS (if indicated):uta  Assets:  Communication Skills Desire for Improvement Housing Social Support  ADL's:  Intact  Cognition: WNL  Sleep:  Fair   Screenings: AIMS     Ad[facility]  (  Dis[facility]  AIMS Total Score  0    AUDIT     [facility]  Alcohol Use Disorder Identification Test Final Score (AUDIT)  0    PHQ2-9     [facility] Office [facility]16[facility] Clinical Support from [facility]  PHQ-2 Total Score  0  0  0  0  5  PHQ-9 Total Score  -  -  -  -  10       Assessment and Plan: Lupita LeashDonna is a 63 year old Caucasian female, married, unemployed, lives in [place], has a history of bipolar disorder, PTSD, panic attacks, GAD was evaluated by telemedicine today.  Patient is biologically predisposed given her history of trauma, chronic mental health problems, health issues.  She also has psychosocial stressors of the current pandemic.  Patient is currently in a Suboxone program and would like to get back on her medications since she has been noncompliant.  She agrees to follow-up as needed and to stay compliant.  Plan as noted below.  Plan Bipolar disorder-unstable Restart Prozac 20 mg p.o. daily. If she tolerates the Prozac well and if she needs to be on a mood stabilizer would consider restarting Trileptal.  The Trileptal was discontinued since she had felt it was giving her side effects however today reports her side effects may have come from lisinopril. Doxepin 10 mg po qhs prn for sleep- she is limiting use.  PTSD-unstable Patient was referred for CBT-pending  GAD-unstable Prozac as prescribed Patient was referred for CBT-pending  Tobacco use disorder-improving Provided smoking cessation  counseling  Opioid use disorder in controlled environment She is currently on a Suboxone program.  Alcohol/ Cannabis use disorder in remission We will monitor closely  Benzodiazepine dependence in remission We will monitor closely  Follow-up in clinic in 3 to 4 weeks or sooner if needed.  January 12 at 4:20 PM  I have spent atleast 15 minutes non  face to face with patient today. More than 50 % of the time was spent for psychoeducation and supportive psychotherapy and care coordination. This note was generated in part or whole with voice recognition software. Voice recognition is usually quite accurate but there are transcription errors that can and very often do occur. I apologize for any typographical errors that were not detected and corrected.       Jomarie LongsSaramma Taiten Brawn, MD 05/27/2019, 6:12 PM

## 2019-06-24 ENCOUNTER — Ambulatory Visit: Payer: BLUE CROSS/BLUE SHIELD | Admitting: Licensed Clinical Social Worker

## 2019-06-24 ENCOUNTER — Other Ambulatory Visit: Payer: Self-pay

## 2019-06-30 ENCOUNTER — Other Ambulatory Visit: Payer: Self-pay

## 2019-06-30 ENCOUNTER — Ambulatory Visit: Payer: BLUE CROSS/BLUE SHIELD | Admitting: Psychiatry

## 2019-07-01 ENCOUNTER — Ambulatory Visit: Payer: BLUE CROSS/BLUE SHIELD | Admitting: Student in an Organized Health Care Education/Training Program

## 2019-07-01 ENCOUNTER — Ambulatory Visit: Payer: BLUE CROSS/BLUE SHIELD | Admitting: Psychiatry

## 2019-09-10 ENCOUNTER — Other Ambulatory Visit: Payer: Self-pay | Admitting: Psychiatry

## 2019-09-10 DIAGNOSIS — F4310 Post-traumatic stress disorder, unspecified: Secondary | ICD-10-CM

## 2019-10-01 ENCOUNTER — Encounter: Payer: Self-pay | Admitting: Emergency Medicine

## 2019-10-01 ENCOUNTER — Other Ambulatory Visit: Payer: Self-pay

## 2019-10-01 ENCOUNTER — Emergency Department
Admission: EM | Admit: 2019-10-01 | Discharge: 2019-10-01 | Disposition: A | Discharge status: Home / Self Care | Payer: BLUE CROSS/BLUE SHIELD | Attending: Emergency Medicine | Admitting: Emergency Medicine

## 2019-10-01 DIAGNOSIS — Z79899 Other long term (current) drug therapy: Secondary | ICD-10-CM | POA: Insufficient documentation

## 2019-10-01 DIAGNOSIS — Z7982 Long term (current) use of aspirin: Secondary | ICD-10-CM | POA: Diagnosis not present

## 2019-10-01 DIAGNOSIS — X509XXA Other and unspecified overexertion or strenuous movements or postures, initial encounter: Secondary | ICD-10-CM | POA: Diagnosis not present

## 2019-10-01 DIAGNOSIS — Z91010 Allergy to peanuts: Secondary | ICD-10-CM | POA: Diagnosis not present

## 2019-10-01 DIAGNOSIS — M545 Low back pain: Secondary | ICD-10-CM | POA: Diagnosis present

## 2019-10-01 DIAGNOSIS — Y9389 Activity, other specified: Secondary | ICD-10-CM | POA: Diagnosis not present

## 2019-10-01 DIAGNOSIS — I10 Essential (primary) hypertension: Secondary | ICD-10-CM | POA: Diagnosis not present

## 2019-10-01 DIAGNOSIS — F17210 Nicotine dependence, cigarettes, uncomplicated: Secondary | ICD-10-CM | POA: Diagnosis not present

## 2019-10-01 DIAGNOSIS — M5431 Sciatica, right side: Secondary | ICD-10-CM | POA: Insufficient documentation

## 2019-10-01 MED ORDER — KETOROLAC TROMETHAMINE 30 MG/ML IJ SOLN
30.0000 mg | Freq: Once | INTRAMUSCULAR | Status: AC
  Administered 2019-10-01: 22:00:00 30 mg via INTRAMUSCULAR
  Filled 2019-10-01: qty 1

## 2019-10-01 MED ORDER — ORPHENADRINE CITRATE 30 MG/ML IJ SOLN
60.0000 mg | INTRAMUSCULAR | Status: AC
  Administered 2019-10-01: 60 mg via INTRAMUSCULAR
  Filled 2019-10-01: qty 2

## 2019-10-01 MED ORDER — PREDNISONE 20 MG PO TABS: ORAL_TABLET | ORAL | 0 refills | Status: DC

## 2019-10-01 MED ORDER — HYDROCODONE-ACETAMINOPHEN 5-325 MG PO TABS: 1.0000 | ORAL_TABLET | Freq: Three times a day (TID) | ORAL | 0 refills | Status: AC | PRN

## 2019-10-01 NOTE — ED Triage Notes (Signed)
Pt to triage via w/c with no distress noted, mask in place; pt reports hx sciatica, last few days pain radiating down left leg; st "have doing a lot of work recently"

## 2019-10-01 NOTE — Discharge Instructions (Addendum)
You are being treated for a flare of your sciatica. Take the prescription meds as directed. Follow-up with your provider as planned.

## 2019-10-02 NOTE — ED Provider Notes (Signed)
[facility] Emergency Department Provider Note ____________________________________________  Time seen: 2130  I have reviewed the triage vital signs and the nursing notes.  HISTORY  Chief Complaint  Back Pain  HPI Katrina Holmes is a 64 y.o. female presents to the ED accompanied by her husband, for evaluation of several days flare of her right sided sciatica.  Patient with a history of radiculopathy and DDD, presents for onset of symptoms a few days ago.  She denies any recent injury by trauma or fall.  She admits to increased activity at home as the family has moved from one house to another.  The patient denies any bladder bowel incontinence, foot drop, saddle anesthesia.  She is currently awaiting a referral to neurology from a primary provider.  Patient denies any current management by pain management specialist.   Past Medical History:  Diagnosis Date  . Allergy   . Anxiety   . Degenerative disc disease, lumbar   . Depression   . Hyperlipidemia   . Hypertension   . IBS (irritable bowel syndrome) 15 years  . PTSD (post-traumatic stress disorder)   . Sciatica   . Suicide attempt [facility])     Patient Active Problem List   Diagnosis Date Noted  . Opioid use disorder, mild, in controlled environment (HCC) 05/27/2019  . Alcohol use disorder, moderate, in sustained remission (HCC) 04/29/2019  . Cannabis use disorder, severe, in sustained remission (HCC) 04/29/2019  . Noncompliance with medication regimen 04/29/2019  . Evaluation by psychiatric service required 04/29/2019  . Mixed bipolar I disorder (HCC) 01/27/2019  . PTSD (post-traumatic stress disorder) 01/27/2019  . GAD (generalized anxiety disorder) 01/27/2019  . Radial nerve palsy, unspecified laterality 11/21/2018  . Encounter for tobacco use cessation counseling 11/12/2018  . Chronic midline low back pain with left-sided sciatica 05/22/2018  . Irritable bowel syndrome with diarrhea 05/22/2018  .  Seasonal allergies 05/22/2018  . Anxiety 08/01/2017  . Cervical postlaminectomy syndrome 08/01/2017  . Lumbar facet arthropathy 08/01/2017  . Opioid use disorder, moderate, dependence (HCC) 01/15/2017  . Sedative, hypnotic or anxiolytic use disorder, severe, dependence (HCC) 01/15/2017  . Tobacco use disorder 01/12/2017  . HTN (hypertension) 01/11/2017  . Severe recurrent major depression without psychotic features (HCC) 12/29/2015  . DDD (degenerative disc disease), cervical 05/25/2015  . 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 DDD (degenerative disc disease), lumbar 05/25/2015  . Major depressive disorder 10/17/2013  . Gastroesophageal reflux disease without esophagitis 10/03/2012  . Hyperlipidemia 10/03/2012    Past Surgical History:  Procedure Laterality Date  . ABDOMINAL HYSTERECTOMY    . MANDIBLE SURGERY    . NECK SURGERY  2006  . SHOULDER ARTHROSCOPY WITH ROTATOR CUFF REPAIR Left    3 surgeries (2006-2007-2008)    Prior to Admission medications   Medication Sig Start Date End Date Taking? Authorizing Provider  acetaminophen (TYLENOL) 325 MG tablet Take by mouth.    [provider]  albuterol (VENTOLIN HFA) 108 (90 Base) MCG/ACT inhaler Inhale into the lungs. 01/10/19 01/10/20  [provider]  aspirin EC 81 MG tablet Take 81 mg by mouth daily.    [provider]  FLUoxetine (PROZAC) 20 MG capsule Take 1 capsule (20 mg total) by mouth daily. 05/27/19   Jomarie Longs, MD  fluticasone (FLONASE) 50 MCG/ACT nasal spray Place 1 spray into both nostrils daily. 09/16/17   [provider]  HYDROcodone-acetaminophen (NORCO) 5-325 MG tablet Take 1 tablet by mouth 3 (three) times daily as needed for up to  5 days. 10/01/19 10/06/19  Varina Hulon, Dannielle Karvonen, PA-C  lidocaine (LIDODERM) 5 % Place 1 patch onto the skin every 12 (twelve) hours. Remove & Discard patch within 12 hours or as directed by MD 05/13/19 05/12/20  Sable Feil, PA-C  loratadine (CLARITIN) 10  MG tablet Take 10 mg by mouth daily.    [provider]  Magnesium 100 MG CAPS Take by mouth daily.    [provider]  omeprazole (PRILOSEC) 20 MG capsule Take 20 mg by mouth daily. 10/14/17   [provider]  predniSONE (DELTASONE) 20 MG tablet Take 3 tabs daily x 3 days; Take 2 tabs daily x 3 days; Take 1 tab daily x 3 days; Take 0.5 tabs daily x 4 days 10/01/19   Maile Linford, Dannielle Karvonen, PA-C  propranolol (INDERAL) 10 MG tablet Take 10 mg by mouth 2 (two) times daily. 04/09/19   [provider]  VITAMIN D, CHOLECALCIFEROL, PO Take 100 mg by mouth daily.    [provider]    Allergies Lisinopril, Peanut oil, Gabapentin, Nsaids, Tramadol, Trazodone and nefazodone, and Penicillins  Family History  Problem Relation Age of Onset  . Heart disease Mother   . Stroke Father   . Alcohol abuse Father   . Alcohol abuse Sister   . Alcohol abuse Brother   . Alcohol abuse Sister   . Alcohol abuse Sister     Social History Social History   Tobacco Use  . Smoking status: Current Every Day Smoker    Packs/day: 1.00    Types: Cigarettes  . Smokeless tobacco: Never Used  Substance Use Topics  . Alcohol use: No  . Drug use: No    Review of Systems  Constitutional: Negative for fever. Cardiovascular: Negative for chest pain. Respiratory: Negative for shortness of breath. Gastrointestinal: Negative for abdominal pain, vomiting and diarrhea. Genitourinary: Negative for dysuria. Musculoskeletal: Positive for back pain. Skin: Negative for rash. Neurological: Negative for headaches, focal weakness or numbness.  Reports right sciatic irritation as above. ____________________________________________  PHYSICAL EXAM:  VITAL SIGNS: ED Triage Vitals [10/01/19 1930]  Enc Vitals Group     BP (!) 172/92     Pulse Rate 84     Resp 18     Temp 98.7 F (37.1 C)     Temp Source Oral     SpO2 98 %     Weight 140 lb (63.5 kg)     Height 5\' 6"  (1.676  m)     Head Circumference      Peak Flow      Pain Score 7     Pain Loc      Pain Edu?      Excl. in [place]?     Constitutional: Alert and oriented. Well appearing and in no distress. Head: Normocephalic and atraumatic. Eyes: Conjunctivae are normal. Normal extraocular movements Cardiovascular: Normal rate, regular rhythm. Normal distal pulses. Respiratory: Normal respiratory effort. No wheezes/rales/rhonchi. Gastrointestinal: Soft and nontender. No distention. Musculoskeletal: Normal spinal alignment without midline tenderness, spasm, deformity, or step-off.  Patient with normal lumbar flexion extension range.  She transitions from supine to sit without assistance.  Nontender with normal range of motion in all extremities.  Neurologic: Radial nerves II through XII grossly intact.  Normal toe dorsiflexion foot eversion on exam.  Normal LE DTRs bilaterally.  Normal gait without ataxia. Normal speech and language. No gross focal neurologic deficits are appreciated. Skin:  Skin is warm, dry and intact. No rash noted.  ____________________________________________  PROCEDURES  Toradol 30 mg IM Norflex 60 mg IM  Procedures ____________________________________________  INITIAL IMPRESSION / ASSESSMENT AND PLAN / ED COURSE  Patient with a history of DDD and lumbar radiculopathy presents with a flare of her right-sided sciatica.  No red flags on exam.  Patient reports and demonstrates improved range of motion and ambulation after ED medication administration.  She is discharged with prescription for prednisone and hydrocodone.  She will take her tizanidine at home as prescribed and follow-up with primary provider as planned.  Return precautions have been discussed.  Katrina Holmes was evaluated in Emergency Department on 10/02/2019 for the symptoms described in the history of present illness. She was evaluated in the context of the global COVID-19 pandemic, which necessitated consideration that  the patient might be at risk for infection with the SARS-CoV-2 virus that causes COVID-19. Institutional protocols and algorithms that pertain to the evaluation of patients at risk for COVID-19 are in a state of rapid change based on information released by regulatory bodies including the CDC and federal and state organizations. These policies and algorithms were followed during the patient's care in the ED.  I reviewed the patient's prescription history over the last 12 months in the multi-state controlled substances database(s) that includes [place], [place], [place], [place], [place], [place], [place], [place], [place], [place], [place], [place], [place], and [place].  Results were notable for no current RX.  ____________________________________________  FINAL CLINICAL IMPRESSION(S) / ED DIAGNOSES  Final diagnoses:  Sciatica of right side      Karmen Stabs, Charlesetta Ivory, PA-C 10/02/19 0032    Emily Filbert, MD 10/02/19 [phone]

## 2019-10-15 ENCOUNTER — Other Ambulatory Visit: Payer: Self-pay | Admitting: Psychiatry

## 2019-10-15 DIAGNOSIS — F4310 Post-traumatic stress disorder, unspecified: Secondary | ICD-10-CM

## 2019-10-15 DIAGNOSIS — F3160 Bipolar disorder, current episode mixed, unspecified: Secondary | ICD-10-CM

## 2019-10-15 DIAGNOSIS — F411 Generalized anxiety disorder: Secondary | ICD-10-CM

## 2020-01-19 ENCOUNTER — Encounter: Payer: Self-pay | Admitting: Emergency Medicine

## 2020-01-19 ENCOUNTER — Emergency Department
Admission: EM | Admit: 2020-01-19 | Discharge: 2020-01-19 | Disposition: A | Discharge status: Home / Self Care | Payer: BLUE CROSS/BLUE SHIELD | Attending: Emergency Medicine | Admitting: Emergency Medicine

## 2020-01-19 ENCOUNTER — Other Ambulatory Visit: Payer: Self-pay

## 2020-01-19 DIAGNOSIS — I10 Essential (primary) hypertension: Secondary | ICD-10-CM | POA: Diagnosis not present

## 2020-01-19 DIAGNOSIS — M5442 Lumbago with sciatica, left side: Secondary | ICD-10-CM | POA: Insufficient documentation

## 2020-01-19 DIAGNOSIS — Z7982 Long term (current) use of aspirin: Secondary | ICD-10-CM | POA: Insufficient documentation

## 2020-01-19 DIAGNOSIS — M549 Dorsalgia, unspecified: Secondary | ICD-10-CM | POA: Diagnosis present

## 2020-01-19 DIAGNOSIS — Z79899 Other long term (current) drug therapy: Secondary | ICD-10-CM | POA: Insufficient documentation

## 2020-01-19 DIAGNOSIS — M5432 Sciatica, left side: Secondary | ICD-10-CM

## 2020-01-19 DIAGNOSIS — F17210 Nicotine dependence, cigarettes, uncomplicated: Secondary | ICD-10-CM | POA: Insufficient documentation

## 2020-01-19 MED ORDER — LIDOCAINE 5 % EX PTCH: 1.0000 | MEDICATED_PATCH | CUTANEOUS | 0 refills | Status: DC

## 2020-01-19 MED ORDER — KETOROLAC TROMETHAMINE 10 MG PO TABS: 10.0000 mg | ORAL_TABLET | Freq: Three times a day (TID) | ORAL | 0 refills | Status: DC

## 2020-01-19 MED ORDER — CYCLOBENZAPRINE HCL 5 MG PO TABS: ORAL_TABLET | ORAL | 0 refills | Status: DC

## 2020-01-19 MED ORDER — OXYCODONE-ACETAMINOPHEN 5-325 MG PO TABS
1.0000 | ORAL_TABLET | Freq: Once | ORAL | Status: AC
  Administered 2020-01-19: 1 via ORAL
  Filled 2020-01-19: qty 1

## 2020-01-19 MED ORDER — LIDOCAINE 5 % EX PTCH
1.0000 | MEDICATED_PATCH | CUTANEOUS | Status: DC
  Administered 2020-01-19: 1 via TRANSDERMAL
  Filled 2020-01-19: qty 1

## 2020-01-19 MED ORDER — KETOROLAC TROMETHAMINE 30 MG/ML IJ SOLN
30.0000 mg | Freq: Once | INTRAMUSCULAR | Status: AC
  Administered 2020-01-19: 30 mg via INTRAMUSCULAR

## 2020-01-19 MED ORDER — ORPHENADRINE CITRATE 30 MG/ML IJ SOLN
60.0000 mg | Freq: Two times a day (BID) | INTRAMUSCULAR | Status: DC
  Administered 2020-01-19: 60 mg via INTRAMUSCULAR
  Filled 2020-01-19: qty 2

## 2020-01-19 MED ORDER — ONDANSETRON 4 MG PO TBDP
4.0000 mg | ORAL_TABLET | Freq: Once | ORAL | Status: AC
  Administered 2020-01-19: 4 mg via ORAL
  Filled 2020-01-19: qty 1

## 2020-01-19 NOTE — ED Triage Notes (Signed)
Pt reports sciatic nerve pain since Thursday. Pt reports hx of the same and knows that is what it is. Pt is scheduled to start pain mgmt Thursday for the same.

## 2020-01-19 NOTE — ED Notes (Signed)
See triage note  Presents with pain to lower back which is moving into left leg  Hx of sciatica in past

## 2020-01-19 NOTE — ED Provider Notes (Signed)
[facility] Emergency Department Provider Note  ____________________________________________  Time seen: Approximately 6:44 PM  I have reviewed the triage vital signs and the nursing notes.   HISTORY  Chief Complaint Sciatica    HPI Katrina Holmes is a 64 y.o. female that presents to the emergency department for evaluation of left-sided back pain that radiates down the left leg for 4 days.  Patient has a history of sciatica and this feels the same.  Pain has not changed in character. No specific trauma.  No bowel or bladder dysfunction or saddle anesthesias.  Patient has an appointment with pain management at the end of the month.  She has taken prednisone before but it makes her nauseous.  No numbness, tingling, weakness.   Past Medical History:  Diagnosis Date  . Allergy   . Anxiety   . Degenerative disc disease, lumbar   . Depression   . Hyperlipidemia   . Hypertension   . IBS (irritable bowel syndrome) 15 years  . PTSD (post-traumatic stress disorder)   . Sciatica   . Sciatica   . Suicide attempt [facility])     Patient Active Problem List   Diagnosis Date Noted  . Opioid use disorder, mild, in controlled environment (HCC) 05/27/2019  . Alcohol use disorder, moderate, in sustained remission (HCC) 04/29/2019  . Cannabis use disorder, severe, in sustained remission (HCC) 04/29/2019  . Noncompliance with medication regimen 04/29/2019  . Evaluation by psychiatric service required 04/29/2019  . Mixed bipolar I disorder (HCC) 01/27/2019  . PTSD (post-traumatic stress disorder) 01/27/2019  . GAD (generalized anxiety disorder) 01/27/2019  . Radial nerve palsy, unspecified laterality 11/21/2018  . Encounter for tobacco use cessation counseling 11/12/2018  . Chronic midline low back pain with left-sided sciatica 05/22/2018  . Irritable bowel syndrome with diarrhea 05/22/2018  . Seasonal allergies 05/22/2018  . Anxiety 08/01/2017  . Cervical  postlaminectomy syndrome 08/01/2017  . Lumbar facet arthropathy 08/01/2017  . Opioid use disorder, moderate, dependence (HCC) 01/15/2017  . Sedative, hypnotic or anxiolytic use disorder, severe, dependence (HCC) 01/15/2017  . Tobacco use disorder 01/12/2017  . HTN (hypertension) 01/11/2017  . Severe recurrent major depression without psychotic features (HCC) 12/29/2015  . DDD (degenerative disc disease), cervical 05/25/2015  . 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 DDD (degenerative disc disease), lumbar 05/25/2015  . Major depressive disorder 10/17/2013  . Gastroesophageal reflux disease without esophagitis 10/03/2012  . Hyperlipidemia 10/03/2012    Past Surgical History:  Procedure Laterality Date  . ABDOMINAL HYSTERECTOMY    . MANDIBLE SURGERY    . NECK SURGERY  2006  . SHOULDER ARTHROSCOPY WITH ROTATOR CUFF REPAIR Left    3 surgeries (2006-2007-2008)    Prior to Admission medications   Medication Sig Start Date End Date Taking? Authorizing Provider  acetaminophen (TYLENOL) 325 MG tablet Take by mouth.    [provider]  aspirin EC 81 MG tablet Take 81 mg by mouth daily.    [provider]  cyclobenzaprine (FLEXERIL) 5 MG tablet Take 1-2 tablets 3 times daily as needed 01/19/20   Enid Derry, PA-C  FLUoxetine (PROZAC) 20 MG capsule Take 1 capsule (20 mg total) by mouth daily. 05/27/19   Jomarie Longs, MD  fluticasone (FLONASE) 50 MCG/ACT nasal spray Place 1 spray into both nostrils daily. 09/16/17   [provider]  ketorolac (TORADOL) 10 MG tablet Take 1 tablet (10 mg total) by mouth every 8 (eight) hours. 01/19/20   Enid Derry, PA-C  lidocaine (LIDODERM) 5 %  Place 1 patch onto the skin daily. Remove & Discard patch within 12 hours or as directed by MD 01/19/20   Enid Derry, PA-C  loratadine (CLARITIN) 10 MG tablet Take 10 mg by mouth daily.    [provider]  Magnesium 100 MG CAPS Take by mouth daily.    [provider]  omeprazole  (PRILOSEC) 20 MG capsule Take 20 mg by mouth daily. 10/14/17   [provider]  propranolol (INDERAL) 10 MG tablet Take 10 mg by mouth 2 (two) times daily. 04/09/19   [provider]  VITAMIN D, CHOLECALCIFEROL, PO Take 100 mg by mouth daily.    [provider]    Allergies Lisinopril, Peanut oil, Gabapentin, Nsaids, Tramadol, Trazodone and nefazodone, and Penicillins  Family History  Problem Relation Age of Onset  . Heart disease Mother   . Stroke Father   . Alcohol abuse Father   . Alcohol abuse Sister   . Alcohol abuse Brother   . Alcohol abuse Sister   . Alcohol abuse Sister     Social History Social History   Tobacco Use  . Smoking status: Current Every Day Smoker    Packs/day: 1.00    Types: Cigarettes  . Smokeless tobacco: Never Used  Vaping Use  . Vaping Use: Never used  Substance Use Topics  . Alcohol use: No  . Drug use: No     Review of Systems  Constitutional: No fever/chills Respiratory  No SOB. Gastrointestinal: No abdominal pain.  No nausea, no vomiting.  Musculoskeletal: Positive for back pain. Skin: Negative for rash, abrasions, lacerations, ecchymosis. Neurological: Negative for headaches, numbness or tingling   ____________________________________________   PHYSICAL EXAM:  VITAL SIGNS: ED Triage Vitals  Enc Vitals Group     BP 01/19/20 1747 (!) 162/128     Pulse Rate 01/19/20 1747 79     Resp 01/19/20 1747 18     Temp 01/19/20 1747 98.3 F (36.8 C)     Temp Source 01/19/20 1747 Oral     SpO2 01/19/20 1747 100 %     Weight 01/19/20 1737 140 lb (63.5 kg)     Height 01/19/20 1737 5\' 6"  (1.676 m)     Head Circumference --      Peak Flow --      Pain Score 01/19/20 1737 6     Pain Loc --      Pain Edu? --      Excl. in [place]? --      Constitutional: Alert and oriented. Well appearing and in no acute distress. Eyes: Conjunctivae are normal. PERRL. EOMI. Head: Atraumatic. ENT:      Ears:      Nose: No  congestion/rhinnorhea.      Mouth/Throat: Mucous membranes are moist.  Neck: No stridor. Cardiovascular: Normal rate, regular rhythm.  Good peripheral circulation. Respiratory: Normal respiratory effort without tachypnea or retractions. Lungs CTAB. Good air entry to the bases with no decreased or absent breath sounds. Gastrointestinal: Bowel sounds 4 quadrants. Soft and nontender to palpation. No guarding or rigidity. No palpable masses. No distention.  Musculoskeletal: Full range of motion to all extremities. No gross deformities appreciated.  Tenderness to palpation to left lumbar muscles.  Strength equal in lower extremities bilaterally. Neurologic:  Normal speech and language. No gross focal neurologic deficits are appreciated.  Skin:  Skin is warm, dry and intact. No rash noted. Psychiatric: Mood and affect are normal. Speech and behavior are normal. Patient exhibits appropriate insight and judgement.  ____________________________________________   LABS (all labs ordered are listed, but only abnormal results are displayed)  Labs Reviewed - No data to display ____________________________________________  EKG   ____________________________________________  RADIOLOGY  No results found.  ____________________________________________    PROCEDURES  Procedure(s) performed:    Procedures    Medications  orphenadrine (NORFLEX) injection 60 mg (60 mg Intramuscular Given 01/19/20 1840)  lidocaine (LIDODERM) 5 % 1 patch (1 patch Transdermal Patch Applied 01/19/20 1840)  oxyCODONE-acetaminophen (PERCOCET/ROXICET) 5-325 MG per tablet 1 tablet (1 tablet Oral Given 01/19/20 1839)  ketorolac (TORADOL) 30 MG/ML injection 30 mg (30 mg Intramuscular Given 01/19/20 1840)  ondansetron (ZOFRAN-ODT) disintegrating tablet 4 mg (4 mg Oral Given 01/19/20 1839)     ____________________________________________   INITIAL IMPRESSION / ASSESSMENT AND PLAN / ED COURSE  Pertinent labs & imaging  results that were available during my care of the patient were reviewed by me and considered in my medical decision making (see chart for details).  Review of the [place] CSRS was performed in accordance of the [facility] prior to dispensing any controlled drugs.     Patient's diagnosis is consistent with sciatica.  Vital signs and exam are reassuring. Pain feels the same as her chronic back pain and has not changed in character. Patient was given Norflex, Toradol, Percocet in the emergency department.  Patient will be discharged home with prescriptions for Toradol, Flexeril, Lidoderm. Patient is to follow up with orthopedics and pain management as directed. Referral was given. Patient is given ED precautions to return to the ED for any worsening or new symptoms.  Katrina Holmes was evaluated in Emergency Department on 01/19/2020 for the symptoms described in the history of present illness. She was evaluated in the context of the global COVID-19 pandemic, which necessitated consideration that the patient might be at risk for infection with the SARS-CoV-2 virus that causes COVID-19. Institutional protocols and algorithms that pertain to the evaluation of patients at risk for COVID-19 are in a state of rapid change based on information released by regulatory bodies including the CDC and federal and state organizations. These policies and algorithms were followed during the patient's care in the ED.   ____________________________________________  FINAL CLINICAL IMPRESSION(S) / ED DIAGNOSES  Final diagnoses:  Sciatica of left side      NEW MEDICATIONS STARTED DURING THIS VISIT:  ED Discharge Orders         Ordered    ketorolac (TORADOL) 10 MG tablet  Every 8 hours     Discontinue  Reprint     01/19/20 1901    cyclobenzaprine (FLEXERIL) 5 MG tablet     Discontinue  Reprint     01/19/20 1901    lidocaine (LIDODERM) 5 %  Every 24 hours     Discontinue  Reprint     01/19/20 1901               This chart was dictated using voice recognition software/Dragon. Despite best efforts to proofread, errors can occur which can change the meaning. Any change was purely unintentional.    Enid Derry, PA-C 01/19/20 2235    Jene Every, MD 01/19/20 2236

## 2020-03-26 ENCOUNTER — Emergency Department
Admission: EM | Admit: 2020-03-26 | Discharge: 2020-03-26 | Disposition: A | Discharge status: Home / Self Care | Payer: BLUE CROSS/BLUE SHIELD | Attending: Emergency Medicine | Admitting: Emergency Medicine

## 2020-03-26 ENCOUNTER — Encounter: Payer: Self-pay | Admitting: Emergency Medicine

## 2020-03-26 ENCOUNTER — Other Ambulatory Visit: Payer: Self-pay

## 2020-03-26 DIAGNOSIS — F17210 Nicotine dependence, cigarettes, uncomplicated: Secondary | ICD-10-CM | POA: Diagnosis not present

## 2020-03-26 DIAGNOSIS — M542 Cervicalgia: Secondary | ICD-10-CM | POA: Insufficient documentation

## 2020-03-26 DIAGNOSIS — Z91010 Allergy to peanuts: Secondary | ICD-10-CM | POA: Diagnosis not present

## 2020-03-26 DIAGNOSIS — Z7982 Long term (current) use of aspirin: Secondary | ICD-10-CM | POA: Diagnosis not present

## 2020-03-26 DIAGNOSIS — G8929 Other chronic pain: Secondary | ICD-10-CM | POA: Insufficient documentation

## 2020-03-26 DIAGNOSIS — I10 Essential (primary) hypertension: Secondary | ICD-10-CM | POA: Insufficient documentation

## 2020-03-26 DIAGNOSIS — Z79899 Other long term (current) drug therapy: Secondary | ICD-10-CM | POA: Diagnosis not present

## 2020-03-26 MED ORDER — LIDOCAINE 5 % EX PTCH: 1.0000 | MEDICATED_PATCH | Freq: Two times a day (BID) | CUTANEOUS | 0 refills | Status: AC

## 2020-03-26 MED ORDER — HYDROCODONE-ACETAMINOPHEN 5-325 MG PO TABS
1.0000 | ORAL_TABLET | Freq: Once | ORAL | Status: AC
  Administered 2020-03-26: 1 via ORAL
  Filled 2020-03-26: qty 1

## 2020-03-26 MED ORDER — LIDOCAINE 5 % EX PTCH
1.0000 | MEDICATED_PATCH | CUTANEOUS | Status: DC
  Administered 2020-03-26: 1 via TRANSDERMAL
  Filled 2020-03-26: qty 1

## 2020-03-26 MED ORDER — HYDROCODONE-ACETAMINOPHEN 5-325 MG PO TABS: 1.0000 | ORAL_TABLET | Freq: Three times a day (TID) | ORAL | 0 refills | Status: DC | PRN

## 2020-03-26 NOTE — ED Triage Notes (Signed)
Says she has a broken neck.  Says she goes to [facility], and was supposed to go to pain management today. They told her that her case is too bad and referred somewhere else.   Says she just needs help with the pain x 1.5 months.

## 2020-03-26 NOTE — Discharge Instructions (Addendum)
Keep your appointment with [facility] on Monday for your injection.  Take medication only as directed and be aware that the hydrocodone could cause drowsiness.  This is 1 every 8 hours as needed for pain.  You may continue using ice or heat to your neck as needed for discomfort.  The Lidoderm patch can be applied every 12 hours to your neck as needed for pain.

## 2020-03-26 NOTE — ED Notes (Signed)
Pt reports chronic neck pain that started about 2 months ago as a result of neck surgery 15 years ago. Has been getting pain mgmt care at pain clinic but last time she went, was told by NP that needs higher level of care. Has appt with pain mgmt MD around 10/25. Came here because has 8/10 pain in neck and needs acute pain mgmt after being told at pain clinic that there was nothing more they could do for her. Pt states that EDP was just in room and stated was going to look at recent MRI.

## 2020-03-26 NOTE — ED Provider Notes (Signed)
[facility] Emergency Department Provider Note  ____________________________________________   First MD Initiated Contact with Patient 03/26/20 [phone]     (approximate)  I have reviewed the triage vital signs and the nursing notes.   HISTORY  Chief Complaint Neck Pain   HPI Katrina Holmes is a 64 y.o. female presents to the ED with complaint of chronic neck pain for approximately 2 months but patient states that this is a result of neck surgery from 15 years ago.  Patient has been seen at [facility] and was there today and also seeing someone in pain management clinic.  She states that the nurse practitioner told her that she needed a higher level of care and that her case was too complicated.  Patient states that she was told to come to the emergency department.  She reports that her pain is an 8 out of 10 and she does not have any medication.  She reports that she had a recent MRI at [facility].  There are no changes in her pain and this is consistent with the pain that she has experienced over the last 2 months.       Past Medical History:  Diagnosis Date  . Allergy   . Anxiety   . Degenerative disc disease, lumbar   . Depression   . Hyperlipidemia   . Hypertension   . IBS (irritable bowel syndrome) 15 years  . PTSD (post-traumatic stress disorder)   . Sciatica   . Sciatica   . Suicide attempt [facility])     Patient Active Problem List   Diagnosis Date Noted  . Opioid use disorder, mild, in controlled environment (HCC) 05/27/2019  . Alcohol use disorder, moderate, in sustained remission (HCC) 04/29/2019  . Cannabis use disorder, severe, in sustained remission (HCC) 04/29/2019  . Noncompliance with medication regimen 04/29/2019  . Evaluation by psychiatric service required 04/29/2019  . Mixed bipolar I disorder (HCC) 01/27/2019  . PTSD (post-traumatic stress disorder) 01/27/2019  . GAD (generalized anxiety disorder) 01/27/2019  . Radial nerve  palsy, unspecified laterality 11/21/2018  . Encounter for tobacco use cessation counseling 11/12/2018  . Chronic midline low back pain with left-sided sciatica 05/22/2018  . Irritable bowel syndrome with diarrhea 05/22/2018  . Seasonal allergies 05/22/2018  . Anxiety 08/01/2017  . Cervical postlaminectomy syndrome 08/01/2017  . Lumbar facet arthropathy 08/01/2017  . Opioid use disorder, moderate, dependence (HCC) 01/15/2017  . Sedative, hypnotic or anxiolytic use disorder, severe, dependence (HCC) 01/15/2017  . Tobacco use disorder 01/12/2017  . HTN (hypertension) 01/11/2017  . Severe recurrent major depression without psychotic features (HCC) 12/29/2015  . DDD (degenerative disc disease), cervical 05/25/2015  . 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 DDD (degenerative disc disease), lumbar 05/25/2015  . Major depressive disorder 10/17/2013  . Gastroesophageal reflux disease without esophagitis 10/03/2012  . Hyperlipidemia 10/03/2012    Past Surgical History:  Procedure Laterality Date  . ABDOMINAL HYSTERECTOMY    . MANDIBLE SURGERY    . NECK SURGERY  2006  . SHOULDER ARTHROSCOPY WITH ROTATOR CUFF REPAIR Left    3 surgeries (2006-2007-2008)    Prior to Admission medications   Medication Sig Start Date End Date Taking? Authorizing Provider  acetaminophen (TYLENOL) 325 MG tablet Take by mouth.    [provider]  aspirin EC 81 MG tablet Take 81 mg by mouth daily.    [provider]  FLUoxetine (PROZAC) 20 MG capsule Take 1 capsule (20 mg total) by mouth daily. 05/27/19   Eappen,  Saramma, MD  fluticasone (FLONASE) 50 MCG/ACT nasal spray Place 1 spray into both nostrils daily. 09/16/17   [provider]  HYDROcodone-acetaminophen (NORCO/VICODIN) 5-325 MG tablet Take 1 tablet by mouth every 8 (eight) hours as needed for moderate pain. 03/26/20   Tommi Rumps, PA-C  lidocaine (LIDODERM) 5 % Place 1 patch onto the skin every 12 (twelve) hours. Remove & Discard patch  within 12 hours or as directed by MD 03/26/20 03/26/21  Bridget Hartshorn L, PA-C  loratadine (CLARITIN) 10 MG tablet Take 10 mg by mouth daily.    [provider]  Magnesium 100 MG CAPS Take by mouth daily.    [provider]  omeprazole (PRILOSEC) 20 MG capsule Take 20 mg by mouth daily. 10/14/17   [provider]  propranolol (INDERAL) 10 MG tablet Take 10 mg by mouth 2 (two) times daily. 04/09/19   [provider]  VITAMIN D, CHOLECALCIFEROL, PO Take 100 mg by mouth daily.    [provider]    Allergies Lisinopril, Peanut oil, Gabapentin, Nsaids, Tramadol, Trazodone and nefazodone, and Penicillins  Family History  Problem Relation Age of Onset  . Heart disease Mother   . Stroke Father   . Alcohol abuse Father   . Alcohol abuse Sister   . Alcohol abuse Brother   . Alcohol abuse Sister   . Alcohol abuse Sister     Social History Social History   Tobacco Use  . Smoking status: Current Every Day Smoker    Packs/day: 1.00    Types: Cigarettes  . Smokeless tobacco: Never Used  Vaping Use  . Vaping Use: Never used  Substance Use Topics  . Alcohol use: No  . Drug use: No    Review of Systems Constitutional: No fever/chills Eyes: No visual changes. ENT: No sore throat. Cardiovascular: Denies chest pain. Respiratory: Denies shortness of breath. Gastrointestinal: No abdominal pain.  No nausea, no vomiting.  Musculoskeletal: Positive cervical pain. Skin: Negative for rash. Neurological: Negative for  focal weakness or numbness. ____________________________________________   PHYSICAL EXAM:  VITAL SIGNS: ED Triage Vitals [03/26/20 0910]  Enc Vitals Group     BP (!) 200/91     Pulse Rate (!) 115     Resp 16     Temp 98.1 F (36.7 C)     Temp Source Oral     SpO2 97 %     Weight 138 lb (62.6 kg)     Height 5\' 6"  (1.676 m)     Head Circumference      Peak Flow      Pain Score 10     Pain Loc      Pain Edu?      Excl. in  GC?     Constitutional: Alert and oriented. Well appearing and in no acute distress. Eyes: Conjunctivae are normal.  Head: Atraumatic. Neck: No stridor.  As noted under muscle skeletal. Cardiovascular: Normal rate, regular rhythm. Grossly normal heart sounds.  Good peripheral circulation. Respiratory: Normal respiratory effort.  No retractions. Lungs CTAB. Gastrointestinal: Soft and nontender. No distention.  Musculoskeletal: Examination of the posterior cervical spine there is no gross deformity and no soft tissue edema or discoloration noted.  There is some generalized tenderness on palpation of the vertebral area and paravertebral muscles bilaterally.  Patient also tender bilateral trapezius area.  Patient is able move upper and lower extremities without any difficulty.   Neurologic:  Normal speech and language. No gross focal neurologic deficits are appreciated.  No gait instability. Skin:  Skin is warm, dry and intact. No rash noted. Psychiatric: Mood and affect are normal. Speech and behavior are normal.  ____________________________________________   PROCEDURES  Procedure(s) performed (including Critical Care):  Procedures   ____________________________________________   INITIAL IMPRESSION / ASSESSMENT AND PLAN / ED COURSE  As part of my medical decision making, I reviewed the following data within the electronic MEDICAL RECORD NUMBER Notes from prior ED visits and [place] Controlled Substance Database  64 year old female presents to the ED with complaint of cervical pain that has worsened in the last 1.5  months however patient has had pain off and on for the last 15 years.  Patient was seen at [facility] today and a phone call verified that patient was over there and that she is returning Monday for an injection.  Patient admits that she does have an appointment for injection on Monday.  A prescription for Norco for the weekend and a prescription for Lidoderm patch was sent to her  pharmacy.  Also a Lidoderm patch and Norco was given to her while in the ED.  Patient is encouraged to keep her appointment and also follow-up with pain management clinic.  ____________________________________________   FINAL CLINICAL IMPRESSION(S) / ED DIAGNOSES  Final diagnoses:  Chronic cervical pain     ED Discharge Orders         Ordered    HYDROcodone-acetaminophen (NORCO/VICODIN) 5-325 MG tablet  Every 8 hours PRN        03/26/20 1027    lidocaine (LIDODERM) 5 %  Every 12 hours        03/26/20 1027          *Please note:  Yukiko Minnich was evaluated in Emergency Department on 03/26/2020 for the symptoms described in the history of present illness. She was evaluated in the context of the global COVID-19 pandemic, which necessitated consideration that the patient might be at risk for infection with the SARS-CoV-2 virus that causes COVID-19. Institutional protocols and algorithms that pertain to the evaluation of patients at risk for COVID-19 are in a state of rapid change based on information released by regulatory bodies including the CDC and federal and state organizations. These policies and algorithms were followed during the patient's care in the ED.  Some ED evaluations and interventions may be delayed as a result of limited staffing during and the pandemic.*   Note:  This document was prepared using Dragon voice recognition software and may include unintentional dictation errors.    Tommi Rumps, PA-C 03/26/20 1459    Jene Every, MD 03/27/20 [phone]

## 2020-08-27 ENCOUNTER — Other Ambulatory Visit: Payer: Self-pay | Admitting: Family Medicine

## 2020-08-27 DIAGNOSIS — Z1231 Encounter for screening mammogram for malignant neoplasm of breast: Secondary | ICD-10-CM

## 2020-08-27 DIAGNOSIS — R7303 Prediabetes: Secondary | ICD-10-CM | POA: Insufficient documentation

## 2020-10-28 ENCOUNTER — Ambulatory Visit (INDEPENDENT_AMBULATORY_CARE_PROVIDER_SITE_OTHER): Payer: BLUE CROSS/BLUE SHIELD

## 2020-10-28 ENCOUNTER — Other Ambulatory Visit: Payer: Self-pay

## 2020-10-28 ENCOUNTER — Other Ambulatory Visit (INDEPENDENT_AMBULATORY_CARE_PROVIDER_SITE_OTHER): Payer: Self-pay | Admitting: Vascular Surgery

## 2020-10-28 ENCOUNTER — Encounter (INDEPENDENT_AMBULATORY_CARE_PROVIDER_SITE_OTHER): Payer: Self-pay | Admitting: Vascular Surgery

## 2020-10-28 ENCOUNTER — Ambulatory Visit (INDEPENDENT_AMBULATORY_CARE_PROVIDER_SITE_OTHER): Payer: BLUE CROSS/BLUE SHIELD | Admitting: Vascular Surgery

## 2020-10-28 VITALS — BP 143/72 | HR 86 | Resp 16 | Ht 66.0 in | Wt 138.0 lb

## 2020-10-28 DIAGNOSIS — I6523 Occlusion and stenosis of bilateral carotid arteries: Secondary | ICD-10-CM | POA: Diagnosis not present

## 2020-10-28 DIAGNOSIS — K219 Gastro-esophageal reflux disease without esophagitis: Secondary | ICD-10-CM

## 2020-10-28 DIAGNOSIS — I10 Essential (primary) hypertension: Secondary | ICD-10-CM

## 2020-10-28 DIAGNOSIS — M5030 Other cervical disc degeneration, unspecified cervical region: Secondary | ICD-10-CM

## 2020-10-28 DIAGNOSIS — I6522 Occlusion and stenosis of left carotid artery: Secondary | ICD-10-CM

## 2020-10-28 DIAGNOSIS — E782 Mixed hyperlipidemia: Secondary | ICD-10-CM | POA: Diagnosis not present

## 2020-10-28 NOTE — Progress Notes (Signed)
MRN : [id number]  Katrina Holmes is a 65 y.o. ([birth year]) female who presents with chief complaint of  Chief Complaint  Patient presents with  . New Patient (Initial Visit)    Ref Katrina Holmes left carotid stenosis  .  History of Present Illness:   The patient is seen for evaluation of carotid stenosis. The carotid stenosis was identified after the patient noted some dizziness and dense calcifications were noted on X-ray.  The patient has had some visual changes. There is no recent history of TIA symptoms or focal motor deficits. There is no prior documented CVA.  There is no history of migraine headaches. There is no history of seizures.  The patient is taking enteric-coated aspirin 81 mg daily.  The patient has a history of coronary artery disease, no recent episodes of angina or shortness of breath. The patient denies PAD or claudication symptoms. There is a history of hyperlipidemia which is being treated with a statin.   Duplex ultrasound of the carotid arteries shows RICA 40-59% and LICA 80-99%.  Current Meds  Medication Sig  . albuterol (VENTOLIN HFA) 108 (90 Base) MCG/ACT inhaler Inhale into the lungs.  Marland Kitchen amLODipine (NORVASC) 5 MG tablet Take 1 tablet by mouth daily.  Marland Kitchen aspirin EC 81 MG tablet Take 81 mg by mouth daily.  . buprenorphine-naloxone (SUBOXONE) 8-2 mg SUBL SL tablet Place 1 tablet under the tongue 2 (two) times daily.  . fluticasone (FLONASE) 50 MCG/ACT nasal spray Place 1 spray into both nostrils daily.  Marland Kitchen loratadine (CLARITIN) 10 MG tablet Take 10 mg by mouth daily.  . pravastatin (PRAVACHOL) 20 MG tablet Take 20 mg by mouth at bedtime.    Past Medical History:  Diagnosis Date  . Allergy   . Anxiety   . Degenerative disc disease, lumbar   . Depression   . Hyperlipidemia   . Hypertension   . IBS (irritable bowel syndrome) 15 years  . PTSD (post-traumatic stress disorder)   . Sciatica   . Sciatica   . Suicide attempt [facility])     Past Surgical  History:  Procedure Laterality Date  . ABDOMINAL HYSTERECTOMY    . MANDIBLE SURGERY    . NECK SURGERY  2006  . SHOULDER ARTHROSCOPY WITH ROTATOR CUFF REPAIR Left    3 surgeries (2006-2007-2008)    Social History Social History   Tobacco Use  . Smoking status: Current Every Day Smoker    Packs/day: 1.00    Types: Cigarettes  . Smokeless tobacco: Never Used  Vaping Use  . Vaping Use: Never used  Substance Use Topics  . Alcohol use: No  . Drug use: No    Family History Family History  Problem Relation Age of Onset  . Heart disease Mother   . Stroke Father   . Alcohol abuse Father   . Alcohol abuse Sister   . Alcohol abuse Brother   . Alcohol abuse Sister   . Alcohol abuse Sister   No family history of bleeding/clotting disorders, porphyria or autoimmune disease   Allergies  Allergen Reactions  . Lisinopril Nausea Only and Swelling    Dehydration Tongue swelling Dehydration Tongue swelling   . Peanut Oil Other (See Comments)    Admitted for anaphylaxis on 01/11/17 after reporting exposure to peanut butter per outside records. Admitted for anaphylaxis on 01/11/17 after reporting exposure to peanut butter per outside records. Admitted for anaphylaxis on 01/11/17 after reporting exposure to peanut butter per outside records.   . Gabapentin Other (  See Comments)    Bloating  Bloating  . Nsaids Nausea And Vomiting  . Tramadol Nausea And Vomiting  . Trazodone And Nefazodone Diarrhea    And hallucinations   . Penicillins Rash    .Has patient had a PCN reaction causing immediate rash, facial/tongue/throat swelling, SOB or lightheadedness with hypotension: Unknown Has patient had a PCN reaction causing severe rash involving mucus membranes or skin necrosis: Unknown Has patient had a PCN reaction that required hospitalization: Unknown Has patient had a PCN reaction occurring within the last 10 years: Unknown If all of the above answers are "NO", then may proceed with  Cephalosporin use.      REVIEW OF SYSTEMS (Negative unless checked)  Constitutional: [] Weight loss  [] Fever  [] Chills Cardiac: [] Chest pain   [] Chest pressure   [] Palpitations   [] Shortness of breath when laying flat   [] Shortness of breath with exertion. Vascular:  [] Pain in legs with walking   [] Pain in legs at rest  [] History of DVT   [] Phlebitis   [] Swelling in legs   [] Varicose veins   [] Non-healing ulcers Pulmonary:   [] Uses home oxygen   [] Productive cough   [] Hemoptysis   [] Wheeze  [] COPD   [] Asthma Neurologic:  [x] Dizziness   [] Seizures   [] History of stroke   [] History of TIA  [] Aphasia   [] Vissual changes   [] Weakness or numbness in arm   [] Weakness or numbness in leg Musculoskeletal:   [] Joint swelling   [x] Joint pain   [x] Low back pain Hematologic:  [] Easy bruising  [] Easy bleeding   [] Hypercoagulable state   [] Anemic Gastrointestinal:  [] Diarrhea   [] Vomiting  [x] Gastroesophageal reflux/heartburn   [] Difficulty swallowing. Genitourinary:  [] Chronic kidney disease   [] Difficult urination  [] Frequent urination   [] Blood in urine Skin:  [] Rashes   [] Ulcers  Psychological:  [] History of anxiety   []  History of major depression.  Physical Examination  Vitals:   10/28/20 0847  BP: (!) 143/72  Pulse: 86  Resp: 16  Weight: 138 lb (62.6 kg)  Height: 5\' 6"  (1.676 m)   Body mass index is 22.27 kg/m. Gen: WD/WN, NAD Head: [place]/AT, No temporalis wasting.  Ear/Nose/Throat: Hearing grossly intact, nares w/o erythema or drainage, poor dentition Eyes: PER, EOMI, sclera nonicteric.  Neck: Supple, no masses.  No bruit or JVD.  Pulmonary:  Good air movement, clear to auscultation bilaterally, no use of accessory muscles.  Cardiac: RRR, normal S1, S2, no Murmurs. Vascular: bilateral carotid bruits Vessel Right Left  Radial Palpable Palpable  Brachial Palpable Palpable  Carotid Palpable Palpable  Gastrointestinal: soft, non-distended. No guarding/no peritoneal signs.   Musculoskeletal: M/S 5/5 throughout.  No deformity or atrophy.  Neurologic: CN 2-12 intact. Pain and light touch intact in extremities.  Symmetrical.  Speech is fluent. Motor exam as listed above. Psychiatric: Judgment intact, Mood & affect appropriate for pt's clinical situation. Dermatologic: No rashes or ulcers noted.  No changes consistent with cellulitis.   CBC Lab Results  Component Value Date   WBC 10.7 (H) 03/09/2019   HGB 13.3 03/09/2019   HCT 39.1 03/09/2019   MCV 82.0 03/09/2019   PLT 373 03/09/2019    BMET    Component Value Date/Time   NA 126 (L) 03/09/2019 1542   NA 140 10/03/2014 1342   K 3.7 03/09/2019 1542   K 3.7 10/03/2014 1342   CL 93 (L) 03/09/2019 1542   CL 105 10/03/2014 1342   CO2 21 (L) 03/09/2019 1542   CO2 25 10/03/2014 1342  GLUCOSE 102 (H) 03/09/2019 1542   GLUCOSE 106 (H) 10/03/2014 1342   BUN 7 (L) 03/09/2019 1542   BUN 13 10/03/2014 1342   CREATININE 0.63 03/09/2019 1542   CREATININE 0.65 10/03/2014 1342   CALCIUM 9.7 03/09/2019 1542   CALCIUM 9.5 10/03/2014 1342   GFRNONAA >60 03/09/2019 1542   GFRNONAA >60 10/03/2014 1342   GFRAA >60 03/09/2019 1542   GFRAA >60 10/03/2014 1342   CrCl cannot be calculated (Patient's most recent lab result is older than the maximum 21 days allowed.).  COAG No results found for: INR, PROTIME  Radiology No results found.    Assessment/Plan 1. Atherosclerosis of both carotid arteries The patient remains asymptomatic with respect to the carotid stenosis.  However, the patient has now progressed and has a lesion the is >70%.  Patient should undergo CT angiography of the carotid arteries to define the degree of stenosis of the internal carotid arteries bilaterally and the anatomic suitability for surgery vs. intervention.  If the patient does indeed need surgery cardiac clearance will be required, once cleared the patient will be scheduled for surgery.  The risks, benefits and alternative  therapies were reviewed in detail with the patient.  All questions were answered.  The patient agrees to proceed with imaging.  Continue antiplatelet therapy as prescribed. Continue management of CAD, HTN and Hyperlipidemia. Healthy heart diet, encouraged exercise at least 4 times per week.   - CT ANGIO NECK W OR WO CONTRAST; Future  2. Primary hypertension Continue antihypertensive medications as already ordered, these medications have been reviewed and there are no changes at this time.   3. DDD (degenerative disc disease), cervical Continue NSAID medications as already ordered, these medications have been reviewed and there are no changes at this time.  Continued activity and therapy was stressed.   4. Mixed hyperlipidemia Continue statin as ordered and reviewed, no changes at this time   5. Gastroesophageal reflux disease without esophagitis Continue PPI as already ordered, this medication has been reviewed and there are no changes at this time.  Avoidence of caffeine and alcohol  Moderate elevation of the head of the bed     [name], MD  10/28/2020 9:09 AM

## 2020-11-09 ENCOUNTER — Telehealth (INDEPENDENT_AMBULATORY_CARE_PROVIDER_SITE_OTHER): Payer: Self-pay | Admitting: Vascular Surgery

## 2020-11-09 NOTE — Telephone Encounter (Signed)
Documentation only.

## 2020-11-12 ENCOUNTER — Ambulatory Visit
Admission: RE | Admit: 2020-11-12 | Discharge: 2020-11-12 | Disposition: A | Discharge status: Home / Self Care | Payer: BLUE CROSS/BLUE SHIELD | Source: Ambulatory Visit | Attending: Vascular Surgery | Admitting: Vascular Surgery

## 2020-11-12 ENCOUNTER — Other Ambulatory Visit: Payer: Self-pay

## 2020-11-12 DIAGNOSIS — I6523 Occlusion and stenosis of bilateral carotid arteries: Secondary | ICD-10-CM | POA: Insufficient documentation

## 2020-11-12 LAB — POCT I-STAT CREATININE: Creatinine, Ser: 0.6 mg/dL (ref 0.44–1.00)

## 2020-11-12 MED ORDER — IOHEXOL 350 MG/ML SOLN
75.0000 mL | Freq: Once | INTRAVENOUS | Status: AC | PRN
  Administered 2020-11-12: 75 mL via INTRAVENOUS

## 2020-11-17 NOTE — H&P (View-Only) (Signed)
MRN : [id number]  Katrina Holmes is a 66 y.o. (Mar 24, 1956) female who presents with chief complaint of No chief complaint on file. Marland Kitchen  History of Present Illness:  The patient is seen for follow up evaluation of carotid stenosis status post CT angiogram. Patient reports that the test went well with no problems or complications.   The patient denies interval amaurosis fugax. There is no recent or interval TIA symptoms or focal motor deficits. There is no prior documented CVA.  The patient is taking enteric-coated aspirin 81 mg daily.  There is no history of migraine headaches. There is no history of seizures.  The patient has a history of coronary artery disease, no recent episodes of angina or shortness of breath. The patient denies PAD or claudication symptoms. There is a history of hyperlipidemia which is being treated with a statin.    CT angiogram is reviewed by me personally and shows 90% stenosis consistent with calcified plaque at the origin of the left internal carotid artery.    No outpatient medications have been marked as taking for the 11/18/20 encounter (Appointment) with Gilda Crease, Latina Craver, MD.    Past Medical History:  Diagnosis Date  . Allergy   . Anxiety   . Degenerative disc disease, lumbar   . Depression   . Hyperlipidemia   . Hypertension   . IBS (irritable bowel syndrome) 15 years  . PTSD (post-traumatic stress disorder)   . Sciatica   . Sciatica   . Suicide attempt [facility])     Past Surgical History:  Procedure Laterality Date  . ABDOMINAL HYSTERECTOMY    . MANDIBLE SURGERY    . NECK SURGERY  2006  . SHOULDER ARTHROSCOPY WITH ROTATOR CUFF REPAIR Left    3 surgeries (2006-2007-2008)    Social History Social History   Tobacco Use  . Smoking status: Current Every Day Smoker    Packs/day: 1.00    Types: Cigarettes  . Smokeless tobacco: Never Used  Vaping Use  . Vaping Use: Never used  Substance Use Topics  . Alcohol use: No  . Drug use:  No    Family History Family History  Problem Relation Age of Onset  . Heart disease Mother   . Stroke Father   . Alcohol abuse Father   . Alcohol abuse Sister   . Alcohol abuse Brother   . Alcohol abuse Sister   . Alcohol abuse Sister     Allergies  Allergen Reactions  . Lisinopril Nausea Only and Swelling    Dehydration Tongue swelling Dehydration Tongue swelling   . Peanut Oil Other (See Comments)    Admitted for anaphylaxis on 01/11/17 after reporting exposure to peanut butter per outside records. Admitted for anaphylaxis on 01/11/17 after reporting exposure to peanut butter per outside records. Admitted for anaphylaxis on 01/11/17 after reporting exposure to peanut butter per outside records.   . Gabapentin Other (See Comments)    Bloating  Bloating  . Nsaids Nausea And Vomiting  . Tramadol Nausea And Vomiting  . Trazodone And Nefazodone Diarrhea    And hallucinations   . Penicillins Rash    .Has patient had a PCN reaction causing immediate rash, facial/tongue/throat swelling, SOB or lightheadedness with hypotension: Unknown Has patient had a PCN reaction causing severe rash involving mucus membranes or skin necrosis: Unknown Has patient had a PCN reaction that required hospitalization: Unknown Has patient had a PCN reaction occurring within the last 10 years: Unknown If all of the above answers  are "NO", then may proceed with Cephalosporin use.      REVIEW OF SYSTEMS (Negative unless checked)  Constitutional: [] Weight loss  [] Fever  [] Chills Cardiac: [] Chest pain   [] Chest pressure   [] Palpitations   [] Shortness of breath when laying flat   [] Shortness of breath with exertion. Vascular:  [] Pain in legs with walking   [] Pain in legs at rest  [] History of DVT   [] Phlebitis   [] Swelling in legs   [] Varicose veins   [] Non-healing ulcers Pulmonary:   [] Uses home oxygen   [] Productive cough   [] Hemoptysis   [] Wheeze  [] COPD   [] Asthma Neurologic:  [] Dizziness    [] Seizures   [] History of stroke   [x] History of TIA  [] Aphasia   [] Vissual changes   [] Weakness or numbness in arm   [] Weakness or numbness in leg Musculoskeletal:   [] Joint swelling   [] Joint pain   [] Low back pain Hematologic:  [] Easy bruising  [] Easy bleeding   [] Hypercoagulable state   [] Anemic Gastrointestinal:  [] Diarrhea   [] Vomiting  [x] Gastroesophageal reflux/heartburn   [] Difficulty swallowing. Genitourinary:  [] Chronic kidney disease   [] Difficult urination  [] Frequent urination   [] Blood in urine Skin:  [] Rashes   [] Ulcers  Psychological:  [] History of anxiety   []  History of major depression.  Physical Examination  There were no vitals filed for this visit. There is no height or weight on file to calculate BMI. Gen: WD/WN, NAD Head: [place]/AT, No temporalis wasting.  Ear/Nose/Throat: Hearing grossly intact, nares w/o erythema or drainage Eyes: PER, EOMI, sclera nonicteric.  Neck: Supple, no large masses.   Pulmonary:  Good air movement, no audible wheezing bilaterally, no use of accessory muscles.  Cardiac: RRR, no JVD Vascular:  Left carotid bruit Vessel Right Left  Radial Palpable Palpable  Brachial Palpable Palpable  Carotid Palpable Palpable  Gastrointestinal: Non-distended. No guarding/no peritoneal signs.  Musculoskeletal: M/S 5/5 throughout.  No deformity or atrophy.  Neurologic: CN 2-12 intact. Symmetrical.  Speech is fluent. Motor exam as listed above. Psychiatric: Judgment intact, Mood & affect appropriate for pt's clinical situation. Dermatologic: No rashes or ulcers noted.  No changes consistent with cellulitis.   CBC Lab Results  Component Value Date   WBC 10.7 (H) 03/09/2019   HGB 13.3 03/09/2019   HCT 39.1 03/09/2019   MCV 82.0 03/09/2019   PLT 373 03/09/2019    BMET    Component Value Date/Time   NA 126 (L) 03/09/2019 1542   NA 140 10/03/2014 1342   K 3.7 03/09/2019 1542   K 3.7 10/03/2014 1342   CL 93 (L) 03/09/2019 1542   CL 105 10/03/2014  1342   CO2 21 (L) 03/09/2019 1542   CO2 25 10/03/2014 1342   GLUCOSE 102 (H) 03/09/2019 1542   GLUCOSE 106 (H) 10/03/2014 1342   BUN 7 (L) 03/09/2019 1542   BUN 13 10/03/2014 1342   CREATININE 0.60 11/12/2020 1546   CREATININE 0.65 10/03/2014 1342   CALCIUM 9.7 03/09/2019 1542   CALCIUM 9.5 10/03/2014 1342   GFRNONAA >60 03/09/2019 1542   GFRNONAA >60 10/03/2014 1342   GFRAA >60 03/09/2019 1542   GFRAA >60 10/03/2014 1342   CrCl cannot be calculated (Unknown ideal weight.).  COAG No results found for: INR, PROTIME  Radiology CT ANGIO NECK W OR WO CONTRAST  Result Date: 11/13/2020 CLINICAL DATA:  Dizziness. EXAM: CT ANGIOGRAPHY NECK TECHNIQUE: Multidetector CT imaging of the neck was performed using the standard protocol during bolus administration of intravenous contrast. Multiplanar CT  image reconstructions and MIPs were obtained to evaluate the vascular anatomy. Carotid stenosis measurements (when applicable) are obtained utilizing NASCET criteria, using the distal internal carotid diameter as the denominator. CONTRAST:  75mL OMNIPAQUE IOHEXOL 350 MG/ML SOLN COMPARISON:  None. FINDINGS: Aortic arch: Calcific and noncalcific atherosclerosis of the aorta and branch vessels. Right carotid system: Mixed calcific and noncalcific atherosclerosis of the internal carotid artery origin at the carotid bifurcation and the proximal internal carotid artery. There is approximately 20% stenosis at the bifurcation and approximately 40% stenosis of the proximal ICA. Left carotid system: Clean trick predominantly noncalcific atherosclerosis of the common carotid artery with up to 40% stenosis of the distal common carotid artery. Mixed calcific and noncalcific atherosclerosis at the carotid bifurcation with approximately 70% stenosis of the proximal ICA. Vertebral arteries: Right dominant. No significant (greater than 50%) stenosis of the right vertebral artery. Severe stenosis of the proximal left vertebral  artery. r Skeleton: C4-C5 and C5-C6 interbody fusion with osseous fusion across the disc. Other neck: No acute abnormality. Upper chest: Centrilobular and paraseptal emphysema. No consolidation. IMPRESSION: 1. Approximately 70% stenosis of the left proximal internal carotid artery and 40% stenosis of the left common carotid artery. 2. Severe stenosis of the proximal nondominant left vertebral artery 3. Approximate 40% stenosis of the right proximal internal carotid artery. 4. Centrilobular and paraseptal emphysema. Electronically Signed   By: Feliberto HartsFrederick S Jones MD   On: 11/13/2020 14:13   VAS US CAROTID  Result Date: 10/28/2020 Carotid Arterial Duplex Study Patient Name:  Manon HildingNNA MARIE Roorda  Date of Exam:   10/28/2020 Medical Rec #: [id number]           Accession #:    [id number] Date of Birth: [birth year]           Patient Gender: F Patient Age:   64Y Exam Location:  [facility] Procedure:      VAS US CAROTID Referring Phys: [id number] Latina CraverGREGORY G Iyannah Blake --------------------------------------------------------------------------------  Indications:       Carotid artery disease. Comparison Study:  [facility]; Referral notes                    stated >90% left carotid stenosis and >50% right carotid                    stenosis; results not available in EPIC or available for                    review at time of appointment Performing Technologist: Jamse MeadWilliam Hosmer RT, RDMS, RVT  Examination Guidelines: A complete evaluation includes B-mode imaging, spectral Doppler, color Doppler, and power Doppler as needed of all accessible portions of each vessel. Bilateral testing is considered an integral part of a complete examination. Limited examinations for reoccurring indications may be performed as noted.  Right Carotid Findings: +----------+--------+--------+--------+------------------+------------------+           PSV cm/sEDV cm/sStenosisPlaque DescriptionComments            +----------+--------+--------+--------+------------------+------------------+ CCA Prox  90      20                                intimal thickening +----------+--------+--------+--------+------------------+------------------+ CCA Mid   92      26                                                   +----------+--------+--------+--------+------------------+------------------+  CCA Distal79      28              heterogenous                         +----------+--------+--------+--------+------------------+------------------+ ICA Prox  145     42      40-59%  heterogenous                         +----------+--------+--------+--------+------------------+------------------+ ICA Mid   90      30                                                   +----------+--------+--------+--------+------------------+------------------+ ICA Distal73      28                                                   +----------+--------+--------+--------+------------------+------------------+ ECA       178     31      >50%    heterogenous                         +----------+--------+--------+--------+------------------+------------------+ +----------+--------+-------+----------------+-------------------+           PSV cm/sEDV cmsDescribe        Arm Pressure (mmHG) +----------+--------+-------+----------------+-------------------+ Subclavian140            Multiphasic, WNL                    +----------+--------+-------+----------------+-------------------+ +---------+--------+--+--------+---------+ VertebralPSV cm/s73EDV cm/sAntegrade +---------+--------+--+--------+---------+  Left Carotid Findings: +----------+--------+--------+--------+-------------------------+--------+           PSV cm/sEDV cm/sStenosisPlaque Description       Comments +----------+--------+--------+--------+-------------------------+--------+ CCA Prox  98      30                                                 +----------+--------+--------+--------+-------------------------+--------+ CCA Mid   94      29              heterogenous                      +----------+--------+--------+--------+-------------------------+--------+ CCA Distal94      27                                                +----------+--------+--------+--------+-------------------------+--------+ ICA Prox  429     139     80-99%  heterogenous and calcific         +----------+--------+--------+--------+-------------------------+--------+ ICA Mid   128     42                                                +----------+--------+--------+--------+-------------------------+--------+ ICA Distal101  40                                                +----------+--------+--------+--------+-------------------------+--------+ ECA       94      21              heterogenous                      +----------+--------+--------+--------+-------------------------+--------+ +----------+--------+--------+----------------+-------------------+           PSV cm/sEDV cm/sDescribe        Arm Pressure (mmHG) +----------+--------+--------+----------------+-------------------+ Subclavian123             Multiphasic, WNL                    +----------+--------+--------+----------------+-------------------+ +---------+--------+--+--------+---------+ VertebralPSV cm/s45EDV cm/sAntegrade +---------+--------+--+--------+---------+  Summary: Right Carotid: Velocities in the right ICA are consistent with a 40-59%                stenosis, however, this is likely due to compensatory flow from                left side stenosis. The ECA appears >50% stenosed. Left Carotid: Velocities in the left ICA are consistent with a 80-99% stenosis.  *See table(s) above for measurements and observations.  Electronically signed by France Noyce MD on 10/28/2020 at 4:51:23 PM.    Final      Assessment/Plan 1. 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  The patient now has progressed and has a lesion the is 90%.  Patient's CT angiography of the carotid arteries confirms 90% left ICA stenosis.  The anatomical considerations support stenting over surgery.  She has had multiple neck surgeries and extensive scar tissue as well as severe immobility.  This was discussed in detail with the patient.  The risks, benefits and alternative therapies were reviewed in detail with the patient.  All questions were answered.  The patient agrees to proceed with stenting of the left carotid artery.  Continue antiplatelet therapy as prescribed. Continue management of CAD, HTN and Hyperlipidemia. Healthy heart diet, encouraged exercise at least 4 times per week.   2. Primary hypertension Continue antihypertensive medications as already ordered, these medications have been reviewed and there are no changes at this time.   3. Gastroesophageal reflux disease without esophagitis Continue PPI as already ordered, this medication has been reviewed and there are no changes at this time.  Avoidence of caffeine and alcohol  Moderate elevation of the head of the bed   4. DDD (degenerative disc disease), cervical Continue NSAID medications as already ordered, these medications have been reviewed and there are no changes at this time.  Continued activity and therapy was stressed.   5. Mixed hyperlipidemia Continue statin as ordered and reviewed, no changes at this time     Kemal Amores, MD  11/17/2020 2:25 PM 

## 2020-11-17 NOTE — Progress Notes (Signed)
MRN : [id number]  Katrina Holmes is a 66 y.o. (Mar 24, 1956) female who presents with chief complaint of No chief complaint on file. Marland Kitchen  History of Present Illness:  The patient is seen for follow up evaluation of carotid stenosis status post CT angiogram. Patient reports that the test went well with no problems or complications.   The patient denies interval amaurosis fugax. There is no recent or interval TIA symptoms or focal motor deficits. There is no prior documented CVA.  The patient is taking enteric-coated aspirin 81 mg daily.  There is no history of migraine headaches. There is no history of seizures.  The patient has a history of coronary artery disease, no recent episodes of angina or shortness of breath. The patient denies PAD or claudication symptoms. There is a history of hyperlipidemia which is being treated with a statin.    CT angiogram is reviewed by me personally and shows 90% stenosis consistent with calcified plaque at the origin of the left internal carotid artery.    No outpatient medications have been marked as taking for the 11/18/20 encounter (Appointment) with Gilda Crease, Latina Craver, MD.    Past Medical History:  Diagnosis Date  . Allergy   . Anxiety   . Degenerative disc disease, lumbar   . Depression   . Hyperlipidemia   . Hypertension   . IBS (irritable bowel syndrome) 15 years  . PTSD (post-traumatic stress disorder)   . Sciatica   . Sciatica   . Suicide attempt [facility])     Past Surgical History:  Procedure Laterality Date  . ABDOMINAL HYSTERECTOMY    . MANDIBLE SURGERY    . NECK SURGERY  2006  . SHOULDER ARTHROSCOPY WITH ROTATOR CUFF REPAIR Left    3 surgeries (2006-2007-2008)    Social History Social History   Tobacco Use  . Smoking status: Current Every Day Smoker    Packs/day: 1.00    Types: Cigarettes  . Smokeless tobacco: Never Used  Vaping Use  . Vaping Use: Never used  Substance Use Topics  . Alcohol use: No  . Drug use:  No    Family History Family History  Problem Relation Age of Onset  . Heart disease Mother   . Stroke Father   . Alcohol abuse Father   . Alcohol abuse Sister   . Alcohol abuse Brother   . Alcohol abuse Sister   . Alcohol abuse Sister     Allergies  Allergen Reactions  . Lisinopril Nausea Only and Swelling    Dehydration Tongue swelling Dehydration Tongue swelling   . Peanut Oil Other (See Comments)    Admitted for anaphylaxis on 01/11/17 after reporting exposure to peanut butter per outside records. Admitted for anaphylaxis on 01/11/17 after reporting exposure to peanut butter per outside records. Admitted for anaphylaxis on 01/11/17 after reporting exposure to peanut butter per outside records.   . Gabapentin Other (See Comments)    Bloating  Bloating  . Nsaids Nausea And Vomiting  . Tramadol Nausea And Vomiting  . Trazodone And Nefazodone Diarrhea    And hallucinations   . Penicillins Rash    .Has patient had a PCN reaction causing immediate rash, facial/tongue/throat swelling, SOB or lightheadedness with hypotension: Unknown Has patient had a PCN reaction causing severe rash involving mucus membranes or skin necrosis: Unknown Has patient had a PCN reaction that required hospitalization: Unknown Has patient had a PCN reaction occurring within the last 10 years: Unknown If all of the above answers  are "NO", then may proceed with Cephalosporin use.      REVIEW OF SYSTEMS (Negative unless checked)  Constitutional: [] Weight loss  [] Fever  [] Chills Cardiac: [] Chest pain   [] Chest pressure   [] Palpitations   [] Shortness of breath when laying flat   [] Shortness of breath with exertion. Vascular:  [] Pain in legs with walking   [] Pain in legs at rest  [] History of DVT   [] Phlebitis   [] Swelling in legs   [] Varicose veins   [] Non-healing ulcers Pulmonary:   [] Uses home oxygen   [] Productive cough   [] Hemoptysis   [] Wheeze  [] COPD   [] Asthma Neurologic:  [] Dizziness    [] Seizures   [] History of stroke   [x] History of TIA  [] Aphasia   [] Vissual changes   [] Weakness or numbness in arm   [] Weakness or numbness in leg Musculoskeletal:   [] Joint swelling   [] Joint pain   [] Low back pain Hematologic:  [] Easy bruising  [] Easy bleeding   [] Hypercoagulable state   [] Anemic Gastrointestinal:  [] Diarrhea   [] Vomiting  [x] Gastroesophageal reflux/heartburn   [] Difficulty swallowing. Genitourinary:  [] Chronic kidney disease   [] Difficult urination  [] Frequent urination   [] Blood in urine Skin:  [] Rashes   [] Ulcers  Psychological:  [] History of anxiety   []  History of major depression.  Physical Examination  There were no vitals filed for this visit. There is no height or weight on file to calculate BMI. Gen: WD/WN, NAD Head: [place]/AT, No temporalis wasting.  Ear/Nose/Throat: Hearing grossly intact, nares w/o erythema or drainage Eyes: PER, EOMI, sclera nonicteric.  Neck: Supple, no large masses.   Pulmonary:  Good air movement, no audible wheezing bilaterally, no use of accessory muscles.  Cardiac: RRR, no JVD Vascular:  Left carotid bruit Vessel Right Left  Radial Palpable Palpable  Brachial Palpable Palpable  Carotid Palpable Palpable  Gastrointestinal: Non-distended. No guarding/no peritoneal signs.  Musculoskeletal: M/S 5/5 throughout.  No deformity or atrophy.  Neurologic: CN 2-12 intact. Symmetrical.  Speech is fluent. Motor exam as listed above. Psychiatric: Judgment intact, Mood & affect appropriate for pt's clinical situation. Dermatologic: No rashes or ulcers noted.  No changes consistent with cellulitis.   CBC Lab Results  Component Value Date   WBC 10.7 (H) 03/09/2019   HGB 13.3 03/09/2019   HCT 39.1 03/09/2019   MCV 82.0 03/09/2019   PLT 373 03/09/2019    BMET    Component Value Date/Time   NA 126 (L) 03/09/2019 1542   NA 140 10/03/2014 1342   K 3.7 03/09/2019 1542   K 3.7 10/03/2014 1342   CL 93 (L) 03/09/2019 1542   CL 105 10/03/2014  1342   CO2 21 (L) 03/09/2019 1542   CO2 25 10/03/2014 1342   GLUCOSE 102 (H) 03/09/2019 1542   GLUCOSE 106 (H) 10/03/2014 1342   BUN 7 (L) 03/09/2019 1542   BUN 13 10/03/2014 1342   CREATININE 0.60 11/12/2020 1546   CREATININE 0.65 10/03/2014 1342   CALCIUM 9.7 03/09/2019 1542   CALCIUM 9.5 10/03/2014 1342   GFRNONAA >60 03/09/2019 1542   GFRNONAA >60 10/03/2014 1342   GFRAA >60 03/09/2019 1542   GFRAA >60 10/03/2014 1342   CrCl cannot be calculated (Unknown ideal weight.).  COAG No results found for: INR, PROTIME  Radiology CT ANGIO NECK W OR WO CONTRAST  Result Date: 11/13/2020 CLINICAL DATA:  Dizziness. EXAM: CT ANGIOGRAPHY NECK TECHNIQUE: Multidetector CT imaging of the neck was performed using the standard protocol during bolus administration of intravenous contrast. Multiplanar CT  image reconstructions and MIPs were obtained to evaluate the vascular anatomy. Carotid stenosis measurements (when applicable) are obtained utilizing NASCET criteria, using the distal internal carotid diameter as the denominator. CONTRAST:  75mL OMNIPAQUE IOHEXOL 350 MG/ML SOLN COMPARISON:  None. FINDINGS: Aortic arch: Calcific and noncalcific atherosclerosis of the aorta and branch vessels. Right carotid system: Mixed calcific and noncalcific atherosclerosis of the internal carotid artery origin at the carotid bifurcation and the proximal internal carotid artery. There is approximately 20% stenosis at the bifurcation and approximately 40% stenosis of the proximal ICA. Left carotid system: Clean trick predominantly noncalcific atherosclerosis of the common carotid artery with up to 40% stenosis of the distal common carotid artery. Mixed calcific and noncalcific atherosclerosis at the carotid bifurcation with approximately 70% stenosis of the proximal ICA. Vertebral arteries: Right dominant. No significant (greater than 50%) stenosis of the right vertebral artery. Severe stenosis of the proximal left vertebral  artery. r Skeleton: C4-C5 and C5-C6 interbody fusion with osseous fusion across the disc. Other neck: No acute abnormality. Upper chest: Centrilobular and paraseptal emphysema. No consolidation. IMPRESSION: 1. Approximately 70% stenosis of the left proximal internal carotid artery and 40% stenosis of the left common carotid artery. 2. Severe stenosis of the proximal nondominant left vertebral artery 3. Approximate 40% stenosis of the right proximal internal carotid artery. 4. Centrilobular and paraseptal emphysema. Electronically Signed   By: Feliberto HartsFrederick S Jones MD   On: 11/13/2020 14:13   VAS US CAROTID  Result Date: 10/28/2020 Carotid Arterial Duplex Study Patient Name:  Katrina Holmes  Date of Exam:   10/28/2020 Medical Rec #: [id number]           Accession #:    [id number] Date of Birth: [birth year]           Patient Gender: F Patient Age:   64Y Exam Location:  [facility] Procedure:      VAS US CAROTID Referring Phys: [id number] Latina CraverGREGORY G Giulliana Mcroberts --------------------------------------------------------------------------------  Indications:       Carotid artery disease. Comparison Study:  [facility]; Referral notes                    stated >90% left carotid stenosis and >50% right carotid                    stenosis; results not available in EPIC or available for                    review at time of appointment Performing Technologist: Jamse MeadWilliam Hosmer RT, RDMS, RVT  Examination Guidelines: A complete evaluation includes B-mode imaging, spectral Doppler, color Doppler, and power Doppler as needed of all accessible portions of each vessel. Bilateral testing is considered an integral part of a complete examination. Limited examinations for reoccurring indications may be performed as noted.  Right Carotid Findings: +----------+--------+--------+--------+------------------+------------------+           PSV cm/sEDV cm/sStenosisPlaque DescriptionComments            +----------+--------+--------+--------+------------------+------------------+ CCA Prox  90      20                                intimal thickening +----------+--------+--------+--------+------------------+------------------+ CCA Mid   92      26                                                   +----------+--------+--------+--------+------------------+------------------+  CCA Distal79      28              heterogenous                         +----------+--------+--------+--------+------------------+------------------+ ICA Prox  145     42      40-59%  heterogenous                         +----------+--------+--------+--------+------------------+------------------+ ICA Mid   90      30                                                   +----------+--------+--------+--------+------------------+------------------+ ICA Distal73      28                                                   +----------+--------+--------+--------+------------------+------------------+ ECA       178     31      >50%    heterogenous                         +----------+--------+--------+--------+------------------+------------------+ +----------+--------+-------+----------------+-------------------+           PSV cm/sEDV cmsDescribe        Arm Pressure (mmHG) +----------+--------+-------+----------------+-------------------+ Subclavian140            Multiphasic, WNL                    +----------+--------+-------+----------------+-------------------+ +---------+--------+--+--------+---------+ VertebralPSV cm/s73EDV cm/sAntegrade +---------+--------+--+--------+---------+  Left Carotid Findings: +----------+--------+--------+--------+-------------------------+--------+           PSV cm/sEDV cm/sStenosisPlaque Description       Comments +----------+--------+--------+--------+-------------------------+--------+ CCA Prox  98      30                                                 +----------+--------+--------+--------+-------------------------+--------+ CCA Mid   94      29              heterogenous                      +----------+--------+--------+--------+-------------------------+--------+ CCA Distal94      27                                                +----------+--------+--------+--------+-------------------------+--------+ ICA Prox  429     139     80-99%  heterogenous and calcific         +----------+--------+--------+--------+-------------------------+--------+ ICA Mid   128     42                                                +----------+--------+--------+--------+-------------------------+--------+ ICA Distal101  40                                                +----------+--------+--------+--------+-------------------------+--------+ ECA       94      21              heterogenous                      +----------+--------+--------+--------+-------------------------+--------+ +----------+--------+--------+----------------+-------------------+           PSV cm/sEDV cm/sDescribe        Arm Pressure (mmHG) +----------+--------+--------+----------------+-------------------+ [id number]             Multiphasic, WNL                    +----------+--------+--------+----------------+-------------------+ +---------+--------+--+--------+---------+ VertebralPSV cm/s45EDV cm/sAntegrade +---------+--------+--+--------+---------+  Summary: Right Carotid: Velocities in the right ICA are consistent with a 40-59%                stenosis, however, this is likely due to compensatory flow from                left side stenosis. The ECA appears >50% stenosed. Left Carotid: Velocities in the left ICA are consistent with a 80-99% stenosis.  *See table(s) above for measurements and observations.  Electronically signed by [name] MD on 10/28/2020 at 4:51:23 PM.    Final      Assessment/Plan 1. 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  The patient now has progressed and has a lesion the is 90%.  Patient's CT angiography of the carotid arteries confirms 90% left ICA stenosis.  The anatomical considerations support stenting over surgery.  She has had multiple neck surgeries and extensive scar tissue as well as severe immobility.  This was discussed in detail with the patient.  The risks, benefits and alternative therapies were reviewed in detail with the patient.  All questions were answered.  The patient agrees to proceed with stenting of the left carotid artery.  Continue antiplatelet therapy as prescribed. Continue management of CAD, HTN and Hyperlipidemia. Healthy heart diet, encouraged exercise at least 4 times per week.   2. Primary hypertension Continue antihypertensive medications as already ordered, these medications have been reviewed and there are no changes at this time.   3. Gastroesophageal reflux disease without esophagitis Continue PPI as already ordered, this medication has been reviewed and there are no changes at this time.  Avoidence of caffeine and alcohol  Moderate elevation of the head of the bed   4. DDD (degenerative disc disease), cervical Continue NSAID medications as already ordered, these medications have been reviewed and there are no changes at this time.  Continued activity and therapy was stressed.   5. Mixed hyperlipidemia Continue statin as ordered and reviewed, no changes at this time     [name], MD  11/17/2020 2:25 PM

## 2020-11-18 ENCOUNTER — Ambulatory Visit (INDEPENDENT_AMBULATORY_CARE_PROVIDER_SITE_OTHER): Payer: BLUE CROSS/BLUE SHIELD | Admitting: Vascular Surgery

## 2020-11-18 ENCOUNTER — Other Ambulatory Visit: Payer: Self-pay

## 2020-11-18 ENCOUNTER — Encounter (INDEPENDENT_AMBULATORY_CARE_PROVIDER_SITE_OTHER): Payer: Self-pay | Admitting: Vascular Surgery

## 2020-11-18 VITALS — BP 128/73 | HR 86 | Ht 66.0 in | Wt 137.0 lb

## 2020-11-18 DIAGNOSIS — I10 Essential (primary) hypertension: Secondary | ICD-10-CM | POA: Diagnosis not present

## 2020-11-18 DIAGNOSIS — K219 Gastro-esophageal reflux disease without esophagitis: Secondary | ICD-10-CM | POA: Diagnosis not present

## 2020-11-18 DIAGNOSIS — I6522 Occlusion and stenosis of left carotid artery: Secondary | ICD-10-CM

## 2020-11-18 DIAGNOSIS — I6529 Occlusion and stenosis of unspecified carotid artery: Secondary | ICD-10-CM | POA: Insufficient documentation

## 2020-11-18 DIAGNOSIS — E782 Mixed hyperlipidemia: Secondary | ICD-10-CM

## 2020-11-18 DIAGNOSIS — M5030 Other cervical disc degeneration, unspecified cervical region: Secondary | ICD-10-CM | POA: Diagnosis not present

## 2020-11-18 MED ORDER — PANTOPRAZOLE SODIUM 40 MG PO TBEC: 40.0000 mg | DELAYED_RELEASE_TABLET | Freq: Every day | ORAL | 11 refills | Status: DC

## 2020-11-18 MED ORDER — CLOPIDOGREL BISULFATE 75 MG PO TABS: 75.0000 mg | ORAL_TABLET | Freq: Every day | ORAL | 5 refills | Status: DC

## 2020-11-20 ENCOUNTER — Encounter (INDEPENDENT_AMBULATORY_CARE_PROVIDER_SITE_OTHER): Payer: Self-pay | Admitting: Vascular Surgery

## 2020-11-22 ENCOUNTER — Telehealth (INDEPENDENT_AMBULATORY_CARE_PROVIDER_SITE_OTHER): Payer: Self-pay

## 2020-11-22 NOTE — Telephone Encounter (Signed)
Spoke with the patient and she is scheduled with Dr. Gilda Crease for a left carotid stent placement on 12/01/20 with 11:15 am at the [facility]. Pre-procedure instructions were discussed and will be mailed.

## 2020-11-29 ENCOUNTER — Other Ambulatory Visit: Payer: Self-pay

## 2020-11-29 ENCOUNTER — Other Ambulatory Visit
Admission: RE | Admit: 2020-11-29 | Discharge: 2020-11-29 | Disposition: A | Discharge status: Home / Self Care | Payer: BLUE CROSS/BLUE SHIELD | Source: Ambulatory Visit | Attending: Vascular Surgery | Admitting: Vascular Surgery

## 2020-11-29 DIAGNOSIS — Z20822 Contact with and (suspected) exposure to covid-19: Secondary | ICD-10-CM | POA: Insufficient documentation

## 2020-11-29 DIAGNOSIS — Z01812 Encounter for preprocedural laboratory examination: Secondary | ICD-10-CM | POA: Insufficient documentation

## 2020-11-29 LAB — SARS CORONAVIRUS 2 (TAT 6-24 HRS): SARS Coronavirus 2: NEGATIVE

## 2020-11-30 ENCOUNTER — Other Ambulatory Visit (INDEPENDENT_AMBULATORY_CARE_PROVIDER_SITE_OTHER): Payer: Self-pay | Admitting: Nurse Practitioner

## 2020-12-01 ENCOUNTER — Other Ambulatory Visit: Payer: Self-pay

## 2020-12-01 ENCOUNTER — Encounter
Admission: RE | Disposition: A | Discharge status: Home / Self Care | Payer: Self-pay | Source: Home / Self Care | Attending: Vascular Surgery

## 2020-12-01 ENCOUNTER — Encounter: Payer: Self-pay | Admitting: Vascular Surgery

## 2020-12-01 ENCOUNTER — Inpatient Hospital Stay
Admission: RE | Admit: 2020-12-01 | Discharge: 2020-12-02 | DRG: 036 | Disposition: A | Discharge status: Home / Self Care | Payer: BLUE CROSS/BLUE SHIELD | Attending: Vascular Surgery | Admitting: Vascular Surgery

## 2020-12-01 DIAGNOSIS — F4310 Post-traumatic stress disorder, unspecified: Secondary | ICD-10-CM | POA: Diagnosis present

## 2020-12-01 DIAGNOSIS — E782 Mixed hyperlipidemia: Secondary | ICD-10-CM | POA: Diagnosis present

## 2020-12-01 DIAGNOSIS — F17210 Nicotine dependence, cigarettes, uncomplicated: Secondary | ICD-10-CM | POA: Diagnosis present

## 2020-12-01 DIAGNOSIS — Z888 Allergy status to other drugs, medicaments and biological substances status: Secondary | ICD-10-CM

## 2020-12-01 DIAGNOSIS — Z8249 Family history of ischemic heart disease and other diseases of the circulatory system: Secondary | ICD-10-CM

## 2020-12-01 DIAGNOSIS — M5136 Other intervertebral disc degeneration, lumbar region: Secondary | ICD-10-CM | POA: Diagnosis present

## 2020-12-01 DIAGNOSIS — I6523 Occlusion and stenosis of bilateral carotid arteries: Secondary | ICD-10-CM | POA: Diagnosis present

## 2020-12-01 DIAGNOSIS — I6522 Occlusion and stenosis of left carotid artery: Secondary | ICD-10-CM | POA: Diagnosis present

## 2020-12-01 DIAGNOSIS — K219 Gastro-esophageal reflux disease without esophagitis: Secondary | ICD-10-CM | POA: Diagnosis present

## 2020-12-01 DIAGNOSIS — Z823 Family history of stroke: Secondary | ICD-10-CM

## 2020-12-01 DIAGNOSIS — Z880 Allergy status to penicillin: Secondary | ICD-10-CM

## 2020-12-01 DIAGNOSIS — Z91010 Allergy to peanuts: Secondary | ICD-10-CM | POA: Diagnosis not present

## 2020-12-01 DIAGNOSIS — I2584 Coronary atherosclerosis due to calcified coronary lesion: Secondary | ICD-10-CM | POA: Diagnosis present

## 2020-12-01 DIAGNOSIS — Z7982 Long term (current) use of aspirin: Secondary | ICD-10-CM

## 2020-12-01 DIAGNOSIS — Z20822 Contact with and (suspected) exposure to covid-19: Secondary | ICD-10-CM | POA: Diagnosis present

## 2020-12-01 DIAGNOSIS — Z885 Allergy status to narcotic agent status: Secondary | ICD-10-CM | POA: Diagnosis not present

## 2020-12-01 DIAGNOSIS — Z886 Allergy status to analgesic agent status: Secondary | ICD-10-CM | POA: Diagnosis not present

## 2020-12-01 DIAGNOSIS — Z811 Family history of alcohol abuse and dependence: Secondary | ICD-10-CM

## 2020-12-01 DIAGNOSIS — I10 Essential (primary) hypertension: Secondary | ICD-10-CM | POA: Diagnosis present

## 2020-12-01 DIAGNOSIS — I2510 Atherosclerotic heart disease of native coronary artery without angina pectoris: Secondary | ICD-10-CM | POA: Diagnosis present

## 2020-12-01 HISTORY — PX: CAROTID PTA/STENT INTERVENTION: CATH118231

## 2020-12-01 LAB — GLUCOSE, CAPILLARY
Glucose-Capillary: 97 mg/dL (ref 70–99)
Glucose-Capillary: 112 mg/dL — ABNORMAL HIGH (ref 70–99)
Glucose-Capillary: 104 mg/dL — ABNORMAL HIGH (ref 70–99)

## 2020-12-01 LAB — MRSA PCR SCREENING: MRSA by PCR: NEGATIVE

## 2020-12-01 LAB — POCT ACTIVATED CLOTTING TIME: Activated Clotting Time: 289 seconds

## 2020-12-01 LAB — BUN: BUN: 15 mg/dL (ref 8–23)

## 2020-12-01 LAB — CREATININE, SERUM
Creatinine, Ser: 0.56 mg/dL (ref 0.44–1.00)
GFR, Estimated: >60 mL/min (ref >60)

## 2020-12-01 SURGERY — CAROTID PTA/STENT INTERVENTION
Anesthesia: Moderate Sedation | Laterality: Left

## 2020-12-01 MED ORDER — ACETAMINOPHEN 500 MG PO TABS: 1000.0000 mg | ORAL_TABLET | Freq: Four times a day (QID) | ORAL | Status: DC | PRN

## 2020-12-01 MED ORDER — ONDANSETRON HCL 4 MG/2ML IJ SOLN
4.0000 mg | Freq: Four times a day (QID) | INTRAMUSCULAR | Status: DC | PRN
  Administered 2020-12-01: 4 mg via INTRAVENOUS
  Filled 2020-12-01: qty 2

## 2020-12-01 MED ORDER — MORPHINE SULFATE (PF) 4 MG/ML IV SOLN: 2.0000 mg | INTRAVENOUS | Status: DC | PRN

## 2020-12-01 MED ORDER — PANTOPRAZOLE SODIUM 40 MG IV SOLR
40.0000 mg | Freq: Every day | INTRAVENOUS | Status: DC
  Administered 2020-12-01: 40 mg via INTRAVENOUS
  Filled 2020-12-01: qty 40

## 2020-12-01 MED ORDER — DOPAMINE-DEXTROSE 3.2-5 MG/ML-% IV SOLN
INTRAVENOUS | Status: AC
  Filled 2020-12-01: qty 250

## 2020-12-01 MED ORDER — CLOPIDOGREL BISULFATE 75 MG PO TABS
75.0000 mg | ORAL_TABLET | Freq: Every day | ORAL | Status: DC
  Administered 2020-12-01 – 2020-12-02 (×2): 75 mg via ORAL
  Filled 2020-12-01 (×2): qty 1

## 2020-12-01 MED ORDER — DOPAMINE-DEXTROSE 3.2-5 MG/ML-% IV SOLN
INTRAVENOUS | Status: AC
  Administered 2020-12-01: 5 ug/kg/min via INTRAVENOUS
  Filled 2020-12-01: qty 250

## 2020-12-01 MED ORDER — BUPRENORPHINE HCL-NALOXONE HCL 8-2 MG SL SUBL
1.0000 | SUBLINGUAL_TABLET | Freq: Two times a day (BID) | SUBLINGUAL | Status: DC
  Administered 2020-12-01 – 2020-12-02 (×2): 1 via SUBLINGUAL
  Filled 2020-12-01 (×2): qty 1

## 2020-12-01 MED ORDER — ATROPINE SULFATE 0.4 MG/ML IJ SOLN
INTRAMUSCULAR | Status: DC | PRN
  Administered 2020-12-01: 1 mg via INTRAVENOUS

## 2020-12-01 MED ORDER — DOPAMINE-DEXTROSE 3.2-5 MG/ML-% IV SOLN: 0.0000 ug/kg/min | INTRAVENOUS | Status: DC

## 2020-12-01 MED ORDER — ALUM & MAG HYDROXIDE-SIMETH 200-200-20 MG/5ML PO SUSP: 15.0000 mL | ORAL | Status: DC | PRN

## 2020-12-01 MED ORDER — ASPIRIN EC 81 MG PO TBEC
81.0000 mg | DELAYED_RELEASE_TABLET | Freq: Every day | ORAL | Status: DC
  Administered 2020-12-02: 81 mg via ORAL
  Filled 2020-12-01: qty 1

## 2020-12-01 MED ORDER — MIDAZOLAM HCL 5 MG/5ML IJ SOLN
INTRAMUSCULAR | Status: AC
  Filled 2020-12-01: qty 5

## 2020-12-01 MED ORDER — LABETALOL HCL 5 MG/ML IV SOLN: 10.0000 mg | INTRAVENOUS | Status: DC | PRN

## 2020-12-01 MED ORDER — PHENYLEPHRINE HCL (PRESSORS) 10 MG/ML IV SOLN
INTRAVENOUS | Status: AC
  Filled 2020-12-01: qty 1

## 2020-12-01 MED ORDER — LORATADINE 10 MG PO TABS
10.0000 mg | ORAL_TABLET | Freq: Every day | ORAL | Status: DC
  Administered 2020-12-02: 10 mg via ORAL
  Filled 2020-12-01: qty 1

## 2020-12-01 MED ORDER — FAMOTIDINE 20 MG PO TABS: 40.0000 mg | ORAL_TABLET | Freq: Once | ORAL | Status: DC | PRN

## 2020-12-01 MED ORDER — KETOROLAC TROMETHAMINE 30 MG/ML IJ SOLN
30.0000 mg | Freq: Four times a day (QID) | INTRAMUSCULAR | Status: DC | PRN
  Filled 2020-12-01: qty 1

## 2020-12-01 MED ORDER — HEPARIN SODIUM (PORCINE) 1000 UNIT/ML IJ SOLN
INTRAMUSCULAR | Status: DC | PRN
  Administered 2020-12-01: 1000 [IU] via INTRAVENOUS
  Administered 2020-12-01: 7000 [IU] via INTRAVENOUS

## 2020-12-01 MED ORDER — CLINDAMYCIN PHOSPHATE 300 MG/50ML IV SOLN: 300.0000 mg | Freq: Once | INTRAVENOUS | Status: AC

## 2020-12-01 MED ORDER — SODIUM CHLORIDE 0.9 % IV SOLN: INTRAVENOUS | Status: DC

## 2020-12-01 MED ORDER — ONDANSETRON HCL 4 MG/2ML IJ SOLN: 4.0000 mg | Freq: Four times a day (QID) | INTRAMUSCULAR | Status: DC | PRN

## 2020-12-01 MED ORDER — HYDRALAZINE HCL 20 MG/ML IJ SOLN: 5.0000 mg | INTRAMUSCULAR | Status: DC | PRN

## 2020-12-01 MED ORDER — KCL IN DEXTROSE-NACL 20-5-0.9 MEQ/L-%-% IV SOLN
INTRAVENOUS | Status: DC
  Filled 2020-12-01 (×5): qty 1000

## 2020-12-01 MED ORDER — SODIUM CHLORIDE 0.9 % IV BOLUS
500.0000 mL | Freq: Once | INTRAVENOUS | Status: AC
  Administered 2020-12-01: 500 mL via INTRAVENOUS

## 2020-12-01 MED ORDER — GUAIFENESIN-DM 100-10 MG/5ML PO SYRP
15.0000 mL | ORAL_SOLUTION | ORAL | Status: DC | PRN
  Filled 2020-12-01: qty 15

## 2020-12-01 MED ORDER — FENTANYL CITRATE (PF) 100 MCG/2ML IJ SOLN
INTRAMUSCULAR | Status: AC
  Filled 2020-12-01: qty 2

## 2020-12-01 MED ORDER — FENTANYL CITRATE (PF) 100 MCG/2ML IJ SOLN
INTRAMUSCULAR | Status: DC | PRN
  Administered 2020-12-01: 50 ug via INTRAVENOUS

## 2020-12-01 MED ORDER — MIDAZOLAM HCL 2 MG/2ML IJ SOLN
INTRAMUSCULAR | Status: DC | PRN
  Administered 2020-12-01: 2 mg via INTRAVENOUS

## 2020-12-01 MED ORDER — CLINDAMYCIN PHOSPHATE 300 MG/50ML IV SOLN
INTRAVENOUS | Status: AC
  Administered 2020-12-01: 300 mg via INTRAVENOUS
  Filled 2020-12-01: qty 50

## 2020-12-01 MED ORDER — IODIXANOL 320 MG/ML IV SOLN
INTRAVENOUS | Status: DC | PRN
Start: 2020-12-01 — End: 2020-12-01
  Administered 2020-12-01: 50 mL

## 2020-12-01 MED ORDER — DIPHENHYDRAMINE HCL 50 MG/ML IJ SOLN: 50.0000 mg | Freq: Once | INTRAMUSCULAR | Status: DC | PRN

## 2020-12-01 MED ORDER — OXYCODONE HCL 5 MG PO TABS
5.0000 mg | ORAL_TABLET | ORAL | Status: DC | PRN
  Administered 2020-12-01 (×3): 5 mg via ORAL
  Administered 2020-12-02: 10 mg via ORAL
  Filled 2020-12-01 (×2): qty 1
  Filled 2020-12-01: qty 2
  Filled 2020-12-01: qty 1

## 2020-12-01 MED ORDER — ESCITALOPRAM OXALATE 20 MG PO TABS
20.0000 mg | ORAL_TABLET | Freq: Every day | ORAL | Status: DC
  Administered 2020-12-01: 20 mg via ORAL
  Filled 2020-12-01 (×2): qty 1

## 2020-12-01 MED ORDER — METOPROLOL TARTRATE 5 MG/5ML IV SOLN: 2.0000 mg | INTRAVENOUS | Status: DC | PRN

## 2020-12-01 MED ORDER — FLUTICASONE PROPIONATE 50 MCG/ACT NA SUSP
1.0000 | Freq: Every day | NASAL | Status: DC | PRN
  Filled 2020-12-01: qty 16

## 2020-12-01 MED ORDER — METHYLPREDNISOLONE SODIUM SUCC 125 MG IJ SOLR: 125.0000 mg | Freq: Once | INTRAMUSCULAR | Status: DC | PRN

## 2020-12-01 MED ORDER — PRAVASTATIN SODIUM 20 MG PO TABS
20.0000 mg | ORAL_TABLET | Freq: Every day | ORAL | Status: DC
  Administered 2020-12-01: 20 mg via ORAL
  Filled 2020-12-01: qty 1

## 2020-12-01 MED ORDER — PHENOL 1.4 % MT LIQD
1.0000 | OROMUCOSAL | Status: DC | PRN
  Filled 2020-12-01: qty 177

## 2020-12-01 MED ORDER — CHLORHEXIDINE GLUCONATE CLOTH 2 % EX PADS
6.0000 | MEDICATED_PAD | Freq: Every day | CUTANEOUS | Status: DC
  Administered 2020-12-01 – 2020-12-02 (×2): 6 via TOPICAL

## 2020-12-01 MED ORDER — AMLODIPINE BESYLATE 5 MG PO TABS
5.0000 mg | ORAL_TABLET | Freq: Every day | ORAL | Status: DC
  Filled 2020-12-01: qty 1

## 2020-12-01 MED ORDER — PANTOPRAZOLE SODIUM 40 MG PO TBEC: 40.0000 mg | DELAYED_RELEASE_TABLET | Freq: Every day | ORAL | Status: DC

## 2020-12-01 MED ORDER — ADULT MULTIVITAMIN W/MINERALS CH
1.0000 | ORAL_TABLET | Freq: Every day | ORAL | Status: DC
  Administered 2020-12-01 – 2020-12-02 (×2): 1 via ORAL
  Filled 2020-12-01 (×3): qty 1

## 2020-12-01 MED ORDER — ATROPINE SULFATE 1 MG/10ML IJ SOSY
PREFILLED_SYRINGE | INTRAMUSCULAR | Status: AC
  Filled 2020-12-01: qty 20

## 2020-12-01 MED ORDER — DOCUSATE SODIUM 100 MG PO CAPS
100.0000 mg | ORAL_CAPSULE | Freq: Every day | ORAL | Status: DC
  Administered 2020-12-02: 100 mg via ORAL
  Filled 2020-12-01: qty 1

## 2020-12-01 MED ORDER — CYCLOBENZAPRINE HCL 10 MG PO TABS
10.0000 mg | ORAL_TABLET | Freq: Every day | ORAL | Status: DC | PRN
  Filled 2020-12-01: qty 1

## 2020-12-01 MED ORDER — MIDAZOLAM HCL 2 MG/ML PO SYRP: 8.0000 mg | ORAL_SOLUTION | Freq: Once | ORAL | Status: DC | PRN

## 2020-12-01 MED ORDER — HEPARIN SODIUM (PORCINE) 1000 UNIT/ML IJ SOLN
INTRAMUSCULAR | Status: AC
  Filled 2020-12-01: qty 1

## 2020-12-01 MED ORDER — ALBUTEROL SULFATE HFA 108 (90 BASE) MCG/ACT IN AERS
2.0000 | INHALATION_SPRAY | RESPIRATORY_TRACT | Status: DC | PRN
  Filled 2020-12-01: qty 6.7

## 2020-12-01 MED ORDER — SODIUM CHLORIDE 0.9 % IV SOLN
500.0000 mL | Freq: Once | INTRAVENOUS | Status: AC | PRN
  Administered 2020-12-01: 500 mL via INTRAVENOUS

## 2020-12-01 SURGICAL SUPPLY — 22 items
BALLN VIATRAC 4X30X135 (BALLOONS) ×2
BALLOON VIATRAC 4X30X135 (BALLOONS) ×1 IMPLANT
CANNULA 5F STIFF (CANNULA) ×2 IMPLANT
CATH ANGIO 5F PIGTAIL 100CM (CATHETERS) ×2 IMPLANT
CATH BEACON 5 .035 100 H1 TIP (CATHETERS) ×2 IMPLANT
CATH BEACON 5 .035 100 SIM1 TP (CATHETERS) ×2 IMPLANT
COVER DRAPE FLUORO 36X44 (DRAPES) ×2 IMPLANT
COVER PROBE U/S 5X48 (MISCELLANEOUS) ×2 IMPLANT
DEVICE EMBOSHIELD NAV6 4.0-7.0 (FILTER) ×2 IMPLANT
DEVICE SAFEGUARD 24CM (GAUZE/BANDAGES/DRESSINGS) ×2 IMPLANT
DEVICE STARCLOSE SE CLOSURE (Vascular Products) ×2 IMPLANT
DEVICE TORQUE (MISCELLANEOUS) ×2 IMPLANT
GLIDEWIRE ANGLED SS 035X260CM (WIRE) ×2 IMPLANT
KIT CAROTID MANIFOLD (MISCELLANEOUS) ×2 IMPLANT
KIT ENCORE 26 ADVANTAGE (KITS) ×2 IMPLANT
PACK ANGIOGRAPHY (CUSTOM PROCEDURE TRAY) ×2 IMPLANT
SHEATH BRITE TIP 6FRX11 (SHEATH) ×2 IMPLANT
SHEATH SHUTTLE 6FR (SHEATH) ×2 IMPLANT
STENT XACT CAR 8-6X40X136 (Permanent Stent) ×2 IMPLANT
SYR MEDRAD MARK 7 150ML (SYRINGE) ×2 IMPLANT
WIRE G VAS 035X260 STIFF (WIRE) ×2 IMPLANT
WIRE GUIDERIGHT .035X150 (WIRE) ×2 IMPLANT

## 2020-12-01 NOTE — Interval H&P Note (Signed)
History and Physical Interval Note:  12/01/2020 8:36 AM  Katrina Holmes  has presented today for surgery, with the diagnosis of LT Carotid Stent   ABBOTT   Carotid artery stenosis Covid  June 3.  The various methods of treatment have been discussed with the patient and family. After consideration of risks, benefits and other options for treatment, the patient has consented to  Procedure(s): CAROTID PTA/STENT INTERVENTION (Left) as a surgical intervention.  The patient's history has been reviewed, patient examined, no change in status, stable for surgery.  I have reviewed the patient's chart and labs.  Questions were answered to the patient's satisfaction.     [name]

## 2020-12-01 NOTE — Op Note (Signed)
OPERATIVE NOTE DATE: 12/01/2020  PROCEDURE:  Ultrasound guidance for vascular access right femoral artery  Placement of a 6 x 8 x 40 mm Exact stent with the use of the NAV-6 embolic protection device in the left carotid artery  PRE-OPERATIVE DIAGNOSIS: 1.  Symptomatic critical left carotid artery stenosis. 2.  TIA left hemispheric  POST-OPERATIVE DIAGNOSIS:  Same as above  SURGEON: [name]  ASSISTANT(S): Dr. Louisa Second  ANESTHESIA: local/MCS  ESTIMATED BLOOD LOSS: 100 cc  CONTRAST: 50 cc  FLUORO TIME: 7.8 minutes  MODERATE CONSCIOUS SEDATION TIME: Continuous ECG pulse oximetry and cardiopulmonary monitoring was performed throughout the entire procedure by the interventional radiology nurse total sedation time was 40 minutes and 17 seconds  FINDING(S): 1.   Greater than 90% left internal carotid artery stenosis severely calcific  SPECIMEN(S):   none  INDICATIONS:   Patient is a 65 y.o. female who presents with critical left carotid artery stenosis.  The patient has a very high bifurcation she was symptomatic and carotid artery stenting was felt to be preferred to endarterectomy for that reason.  Risks and benefits were discussed and informed consent was obtained.   DESCRIPTION: After obtaining full informed written consent, the patient was brought back to the vascular suite and placed supine upon the table.  The patient received IV antibiotics prior to induction. Moderate conscious sedation was administered during a face to face encounter with the patient throughout the procedure with my supervision of the RN administering medicines and monitoring the patients vital signs and mental status throughout from the start of the procedure until the patient was taken to the recovery room.    After obtaining adequate sedation, the patient was prepped and draped in the standard fashion.    A first assistant is required in order to allow for a safe and more efficient  operation.  Duties include wire manipulations as well as assistance with pinning the sheath and positioning the detector for proper angle, assistance and deploying the stent in the proper position and appropriate images.  Further duties include assisting with patient positioning during the procedure.  I believe that this procedure requires a first assistant in order for it to be performed at a level in keeping with the high standards of this institution.  The right femoral artery was visualized with ultrasound and found to be widely patent. It was then accessed under direct ultrasound guidance without difficulty with a micropuncture needle. A permanent image was recorded.  A microwire was then advanced without difficulty under fluoroscopic guidance followed by a micro-sheath.  A J-wire was placed and we then placed a 6 French sheath. The patient was then heparinized and a total of 8000 units of intravenous heparin were given and an ACT was checked to confirm successful anticoagulation.   A pigtail catheter was then placed into the ascending aorta. This showed bovine arch. The left artery was then selectively cannulated without difficulty with a Simmons 1 catheter and the catheter advanced into the mid left common carotid artery.  Cervical and cerebral carotid angiography was then performed. There were no obvious intracranial filling defects. The carotid bifurcation demonstrated greater than 90% stenosis of the left internal carotid artery at the origin.  I then advanced into the external carotid artery with a Glidewire and the Simmons 1 catheter and then exchanged for the Amplatz Super Stiff wire. Over the Amplatz Super Stiff wire, a 6 [place] shuttle sheath was placed into the mid common carotid artery. I then used  the NAV-6  Embolic protection device and crossed the lesion and parked this in the distal internal carotid artery at the base of the skull.  I then selected a 6 mm x 8 mm x 40 mm exact stent. This was  deployed across the lesion encompassing it in its entirety. A 4 mm x 30 mm length balloon was used to post dilate the stent. Only about a 10% residual stenosis was present after angioplasty. Completion angiogram showed normal intracranial filling without new defects. At this point I elected to terminate the procedure. The sheath was removed and StarClose closure device was deployed in the right femoral artery which did not take and therefore manual pressure was held.  The patient was taken to the recovery room in stable condition having tolerated the procedure well.  COMPLICATIONS: none  CONDITION: stable  [name] 12/01/2020 8:24 AM   This note was created with Dragon Medical transcription system. Any errors in dictation are purely unintentional.

## 2020-12-01 NOTE — Progress Notes (Addendum)
Pt admitted to ICU from recovery unit. Pt arrived on Dopamine @ 7.5. Pt received 500 ml NS from previous unit.   BP progressively soft, had to titrate Dopamine up to 10, and 500 ml NS bolus given.   Pt's BP improved following bolus. Dopamine titrated down to 5. Pt trialed off dopamine near 1600 without success. Dopamine restarted @ 5 mcg/kg/min.   Pt endorses chronic posterior neck pain, PRN oxy given. Pt resting adequately.   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 Per Dr. Gilda Crease, keep PAD for 24 hrs (Begin removing air from PAD in AM near 0800 on 6/16). No vascular/bleeding issues noted. +2 radial and dorsal pulses.    Pt A/O x4.   Pt voiding via purewick, clear yellow urine, 1.6L out.   Pt turning independently. Will continue to monitor.

## 2020-12-01 NOTE — Progress Notes (Signed)
   12/01/20 1400  Clinical Encounter Type  Visited With Patient and family together  Visit Type Initial;Spiritual support;Social support  Referral From Chaplain  Consult/Referral To Chaplain   Katrina Holmes visited with PT and her husband who was at bedside. The PT was actively eating a snack but welcomed the visit. PT said she was ecstatic that she was able to eat a snack. Chaplain visited briefly and ministered with presence. Chaplain also explained chaplain services and let them know that a Chaplain is available 24/7.

## 2020-12-02 LAB — BASIC METABOLIC PANEL
Anion gap: 3 — ABNORMAL LOW (ref 5–15)
BUN: 8 mg/dL (ref 8–23)
CO2: 27 mmol/L (ref 22–32)
Calcium: 8.7 mg/dL — ABNORMAL LOW (ref 8.9–10.3)
Chloride: 111 mmol/L (ref 98–111)
Creatinine, Ser: 0.49 mg/dL (ref 0.44–1.00)
GFR, Estimated: >60 mL/min (ref >60)
Glucose, Bld: 103 mg/dL — ABNORMAL HIGH (ref 70–99)
Potassium: 4.4 mmol/L (ref 3.5–5.1)
Sodium: 141 mmol/L (ref 135–145)

## 2020-12-02 LAB — CBC
HCT: 34.4 % — ABNORMAL LOW (ref 36.0–46.0)
Hemoglobin: 11.3 g/dL — ABNORMAL LOW (ref 12.0–15.0)
MCH: 28 pg (ref 26.0–34.0)
MCHC: 32.8 g/dL (ref 30.0–36.0)
MCV: 85.4 fL (ref 80.0–100.0)
Platelets: 273 10*3/uL (ref 150–400)
RBC: 4.03 MIL/uL (ref 3.87–5.11)
RDW: 14.7 % (ref 11.5–15.5)
WBC: 7.3 10*3/uL (ref 4.0–10.5)
nRBC: 0 % (ref 0.0–0.2)

## 2020-12-02 LAB — GLUCOSE, CAPILLARY
Glucose-Capillary: 104 mg/dL — ABNORMAL HIGH (ref 70–99)
Glucose-Capillary: 104 mg/dL — ABNORMAL HIGH (ref 70–99)
Glucose-Capillary: 105 mg/dL — ABNORMAL HIGH (ref 70–99)
Glucose-Capillary: 106 mg/dL — ABNORMAL HIGH (ref 70–99)
Glucose-Capillary: 95 mg/dL (ref 70–99)

## 2020-12-02 MED ORDER — ASPIRIN EC 81 MG PO TBEC: 81.0000 mg | DELAYED_RELEASE_TABLET | Freq: Every day | ORAL | 2 refills | Status: AC

## 2020-12-02 NOTE — Discharge Summary (Signed)
[facility]    Discharge Summary    Patient ID:  Katrina Holmes MRN: [id number] DOB/AGE: 10/21/1955 65 y.o.  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  On the day of admission she underwent successful stent placement with a 6 x 8 x 40 exact stent.  There were no intra procedural complications.  Neurologically she is remained stable.  Postoperatively she did require dopamine but this has been weaned off.  This morning she is tolerating a regular diet and feeling well.  Hospital Course:  Katrina Holmes is a 65 y.o. female is S/P Left Procedure(s): CAROTID PTA/STENT INTERVENTION Extubated: POD # 0 Physical exam: Groin clean intact Post-op wounds healing well Pt. Ambulating, voiding and taking PO diet without difficulty. Pt pain controlled with PO pain meds. Labs as below Complications:none  Consults:    Significant Diagnostic Studies: CBC Lab Results  Component Value Date   WBC 7.3 12/02/2020   HGB 11.3 (L) 12/02/2020   HCT 34.4 (L) 12/02/2020   MCV 85.4 12/02/2020   PLT 273 12/02/2020    BMET    Component Value Date/Time   NA 141 12/02/2020 0557   NA 140 10/03/2014 1342   K 4.4  12/02/2020 0557   K 3.7 10/03/2014 1342   CL 111 12/02/2020 0557   CL 105 10/03/2014 1342   CO2 27 12/02/2020 0557   CO2 25 10/03/2014 1342   GLUCOSE 103 (H) 12/02/2020 0557   GLUCOSE 106 (H) 10/03/2014 1342   BUN 8 12/02/2020 0557   BUN 13 10/03/2014 1342   CREATININE 0.49 12/02/2020 0557   CREATININE 0.65 10/03/2014 1342   CALCIUM 8.7 (L) 12/02/2020 0557   CALCIUM 9.5 10/03/2014 1342   GFRNONAA >60 12/02/2020 0557   GFRNONAA >60 10/03/2014 1342   GFRAA >60 03/09/2019 1542   GFRAA >60 10/03/2014 1342   COAG No results found for: INR, PROTIME   Disposition:  Discharge to :Home Discharge Instructions     Call MD for:  redness, tenderness, or signs of infection (pain, swelling, bleeding, redness, odor or green/yellow discharge around incision site)   Complete by: As directed    Call MD for:  severe or increased pain, loss or decreased feeling  in affected limb(s)   Complete by: As directed    Call MD for:  temperature >100.5   Complete by: As directed    Discharge instructions   Complete by: As directed    Okay to shower after 36 hours.  Please remove the right groin bandage before showering and replace with a Band-Aid daily as needed  Please change your aspirin from a 325 mg pill to an 81 mg pill and continue with Plavix 75 mg daily.  Because you are taking both the aspirin and  the Plavix please continue to take the Protonix.   Driving Restrictions   Complete by: As directed    No driving for 4 days   Lifting restrictions   Complete by: As directed    No lifting for 2 weeks   No dressing needed   Complete by: As directed    Replace only if drainage present   Resume previous diet   Complete by: As directed       Allergies as of 12/02/2020       Reactions   Lisinopril Nausea Only, Swelling   Dehydration Tongue swelling Dehydration Tongue swelling   Peanut Oil Other (See Comments)   Admitted for anaphylaxis on 01/11/17 after reporting exposure to peanut butter  per outside records. Admitted for anaphylaxis on 01/11/17 after reporting exposure to peanut butter per outside records. Admitted for anaphylaxis on 01/11/17 after reporting exposure to peanut butter per outside records.   Gabapentin Other (See Comments)   Bloating Bloating   Nsaids Nausea And Vomiting   Tramadol Nausea And Vomiting   Trazodone And Nefazodone Diarrhea   And hallucinations    Penicillins Rash   .Has patient had a PCN reaction causing immediate rash, facial/tongue/throat swelling, SOB or lightheadedness with hypotension: Unknown Has patient had a PCN reaction causing severe rash involving mucus membranes or skin necrosis: Unknown Has patient had a PCN reaction that required hospitalization: Unknown Has patient had a PCN reaction occurring within the last 10 years: Unknown If all of the above answers are "NO", then may proceed with Cephalosporin use.        Medication List     TAKE these medications    acetaminophen 500 MG tablet Commonly known as: TYLENOL Take 1,000 mg by mouth every 4 (four) hours as needed for moderate pain or fever.   albuterol 108 (90 Base) MCG/ACT inhaler Commonly known as: VENTOLIN HFA Inhale 2 puffs into the lungs every 4 (four) hours as needed for wheezing.   amLODipine 5 MG tablet Commonly known as: NORVASC Take 5 mg by mouth daily.   aspirin EC 81 MG tablet Take 1 tablet (81 mg total) by mouth daily. Swallow whole. What changed:  medication strength how much to take additional instructions   buprenorphine-naloxone 8-2 mg Subl SL tablet Commonly known as: SUBOXONE Place 1 tablet under the tongue 2 (two) times daily.   clopidogrel 75 MG tablet Commonly known as: Plavix Take 1 tablet (75 mg total) by mouth daily. What changed: when to take this   cyclobenzaprine 10 MG tablet Commonly known as: FLEXERIL Take 10 mg by mouth daily as needed for muscle spasms.   escitalopram 20 MG tablet Commonly known as: LEXAPRO Take 20 mg  by mouth at bedtime.   fluticasone 50 MCG/ACT nasal spray Commonly known as: FLONASE Place 1 spray into both nostrils daily as needed for allergies.   lansoprazole 15 MG capsule Commonly known as: PREVACID Take 30 mg by mouth daily at 12 noon.   lidocaine 5 % Commonly known as: Lidoderm Place 1 patch onto the skin every 12 (twelve) hours. Remove & Discard patch within 12 hours or as directed by MD   loratadine 10 MG tablet Commonly known as: CLARITIN Take 10 mg by mouth daily.   multivitamin with minerals tablet Take 1 tablet by mouth daily.   pantoprazole 40 MG tablet Commonly known as: Protonix Take 1 tablet (40 mg total) by mouth daily.   pravastatin 20 MG tablet Commonly known as: PRAVACHOL Take 20 mg by  mouth at bedtime.       Verbal and written Discharge instructions given to the patient. Wound care per Discharge AVS  Follow-up Information     Eymi Lipuma, Latina Craver, MD Follow up in 2 week(s).   Specialties: Vascular Surgery, Cardiology, Radiology, Vascular Surgery Why: with a carotid duplex Contact information: [address] [place] [address] [phone]                 Signed: [name], MD  12/02/2020, 3:49 PM

## 2020-12-02 NOTE — Progress Notes (Signed)
Dopamine gtt stopped this afternoon. BP stable. PAD removed this evening. Old drainage noted on dressing, no s/s of bleeding or hematoma post removal.   Discharge instructions reviewed with patient and pt's husband. Pt discharged to home near 1700.   IV removed x2.

## 2020-12-03 ENCOUNTER — Other Ambulatory Visit (INDEPENDENT_AMBULATORY_CARE_PROVIDER_SITE_OTHER): Payer: Self-pay | Admitting: Nurse Practitioner

## 2020-12-03 ENCOUNTER — Telehealth (INDEPENDENT_AMBULATORY_CARE_PROVIDER_SITE_OTHER): Payer: Self-pay

## 2020-12-03 MED ORDER — HYDROCODONE-ACETAMINOPHEN 5-325 MG PO TABS: 1.0000 | ORAL_TABLET | Freq: Four times a day (QID) | ORAL | 0 refills | Status: DC | PRN

## 2020-12-03 NOTE — Telephone Encounter (Signed)
sent 

## 2020-12-03 NOTE — Telephone Encounter (Signed)
Pharmacy in chart is correct

## 2020-12-03 NOTE — Telephone Encounter (Signed)
Let's clarify her pharmacy and I'll send her some in

## 2020-12-07 ENCOUNTER — Telehealth (INDEPENDENT_AMBULATORY_CARE_PROVIDER_SITE_OTHER): Payer: Self-pay

## 2020-12-07 ENCOUNTER — Other Ambulatory Visit (INDEPENDENT_AMBULATORY_CARE_PROVIDER_SITE_OTHER): Payer: Self-pay | Admitting: Nurse Practitioner

## 2020-12-07 MED ORDER — HYDROCODONE-ACETAMINOPHEN 5-325 MG PO TABS: 1.0000 | ORAL_TABLET | Freq: Four times a day (QID) | ORAL | 0 refills | Status: AC | PRN

## 2020-12-07 NOTE — Telephone Encounter (Signed)
I called the pt an made her aware of the NP's instructions.

## 2020-12-07 NOTE — Telephone Encounter (Signed)
The pt called the nurses line wanting to know could she have a refill on her hydrocodone. Please advise.

## 2020-12-07 NOTE — Telephone Encounter (Signed)
Refill was sent.  This will be the last refill authorized

## 2020-12-17 ENCOUNTER — Encounter (INDEPENDENT_AMBULATORY_CARE_PROVIDER_SITE_OTHER): Payer: BLUE CROSS/BLUE SHIELD

## 2020-12-17 ENCOUNTER — Ambulatory Visit (INDEPENDENT_AMBULATORY_CARE_PROVIDER_SITE_OTHER): Payer: BLUE CROSS/BLUE SHIELD | Admitting: Nurse Practitioner

## 2020-12-17 ENCOUNTER — Other Ambulatory Visit (INDEPENDENT_AMBULATORY_CARE_PROVIDER_SITE_OTHER): Payer: Self-pay | Admitting: Vascular Surgery

## 2020-12-17 ENCOUNTER — Encounter (INDEPENDENT_AMBULATORY_CARE_PROVIDER_SITE_OTHER): Payer: Self-pay | Admitting: Nurse Practitioner

## 2020-12-17 DIAGNOSIS — I6529 Occlusion and stenosis of unspecified carotid artery: Secondary | ICD-10-CM

## 2021-04-13 ENCOUNTER — Encounter (INDEPENDENT_AMBULATORY_CARE_PROVIDER_SITE_OTHER): Payer: BLUE CROSS/BLUE SHIELD

## 2021-04-13 ENCOUNTER — Ambulatory Visit (INDEPENDENT_AMBULATORY_CARE_PROVIDER_SITE_OTHER): Payer: BLUE CROSS/BLUE SHIELD | Admitting: Nurse Practitioner

## 2021-05-17 ENCOUNTER — Other Ambulatory Visit (INDEPENDENT_AMBULATORY_CARE_PROVIDER_SITE_OTHER): Payer: Self-pay | Admitting: Vascular Surgery

## 2021-06-28 DIAGNOSIS — R69 Illness, unspecified: Secondary | ICD-10-CM | POA: Diagnosis not present

## 2021-07-06 DIAGNOSIS — R69 Illness, unspecified: Secondary | ICD-10-CM | POA: Diagnosis not present

## 2021-10-22 ENCOUNTER — Other Ambulatory Visit (INDEPENDENT_AMBULATORY_CARE_PROVIDER_SITE_OTHER): Payer: Self-pay | Admitting: Vascular Surgery

## 2021-10-26 NOTE — Telephone Encounter (Signed)
Patient will need to seen with carotid ultrasound follow up schnier or fallon to continue with refills ?

## 2021-12-09 ENCOUNTER — Other Ambulatory Visit (INDEPENDENT_AMBULATORY_CARE_PROVIDER_SITE_OTHER): Payer: Self-pay | Admitting: Vascular Surgery

## 2021-12-25 ENCOUNTER — Other Ambulatory Visit (INDEPENDENT_AMBULATORY_CARE_PROVIDER_SITE_OTHER): Payer: Self-pay | Admitting: Vascular Surgery

## 2022-01-03 ENCOUNTER — Other Ambulatory Visit (INDEPENDENT_AMBULATORY_CARE_PROVIDER_SITE_OTHER): Payer: Self-pay | Admitting: Nurse Practitioner

## 2022-01-03 ENCOUNTER — Other Ambulatory Visit (INDEPENDENT_AMBULATORY_CARE_PROVIDER_SITE_OTHER): Payer: Self-pay | Admitting: Vascular Surgery

## 2022-01-05 NOTE — Telephone Encounter (Signed)
Pt states that she is having problems with her insurance she has to pay $40 and then half of the office visit cost in order to see any dr.  She states she can not see any dr until Jan 2024 when she can change ins companies.

## 2022-02-06 ENCOUNTER — Other Ambulatory Visit (INDEPENDENT_AMBULATORY_CARE_PROVIDER_SITE_OTHER): Payer: Self-pay | Admitting: Vascular Surgery

## 2022-03-08 IMAGING — CT CT ANGIO NECK
3 of 7 series · 8 of 33 positions shown · IV contrast (omnipaque)
Comparison: None.

CLINICAL DATA: Dizziness.

EXAM:
CT ANGIOGRAPHY NECK
TECHNIQUE: Multidetector CT imaging of the neck was performed using the
standard protocol during bolus administration of intravenous
contrast. Multiplanar CT image reconstructions and MIPs were
obtained to evaluate the vascular anatomy. Carotid stenosis
measurements (when applicable) are obtained utilizing NASCET
criteria, using the distal internal carotid diameter as the
denominator.
CONTRAST:  75mL OMNIPAQUE IOHEXOL 350 MG/ML SOLN

[Series 5: cta neck cta neck (person_name) 2.00 · axial · 0.40mm/px · z∈[-696,-600]mm · 2 of 146 slices shown]
[im 49/146  soft-tissue]
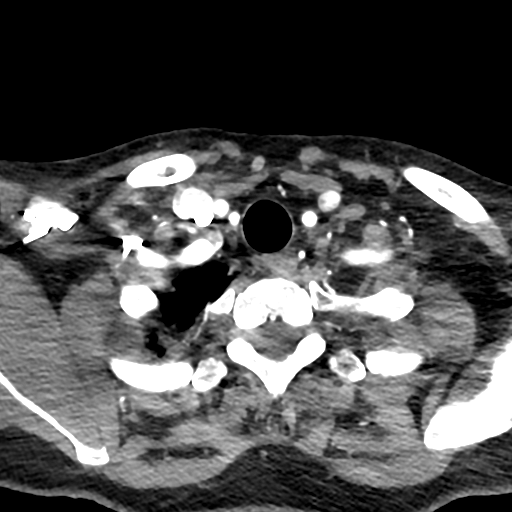
[im 97/146  soft-tissue]
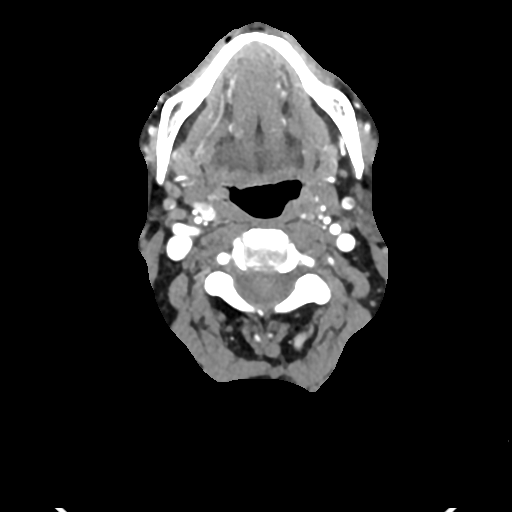

[Series 6: ax thin mips cta neck 1.00 ax · axial · 0.40mm/px · z∈[-744,-549]mm · 5 of 293 slices shown]
[im 49/293  soft-tissue]
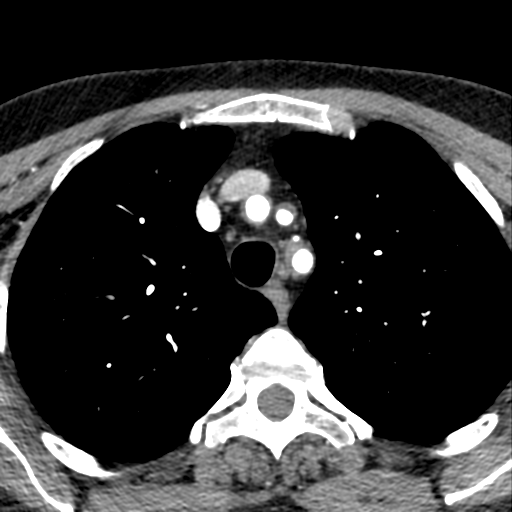
[im 98/293  bone]
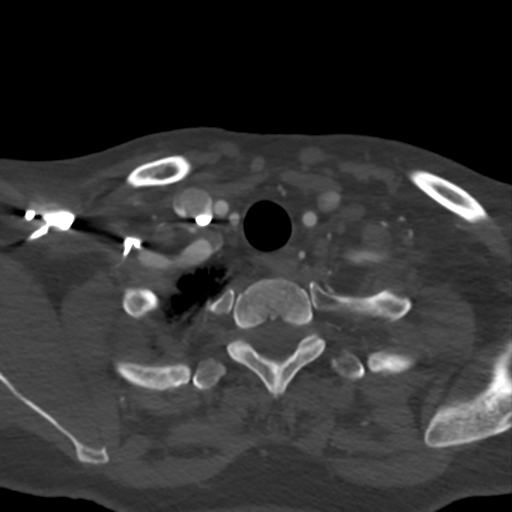
[im 147/293  soft-tissue]
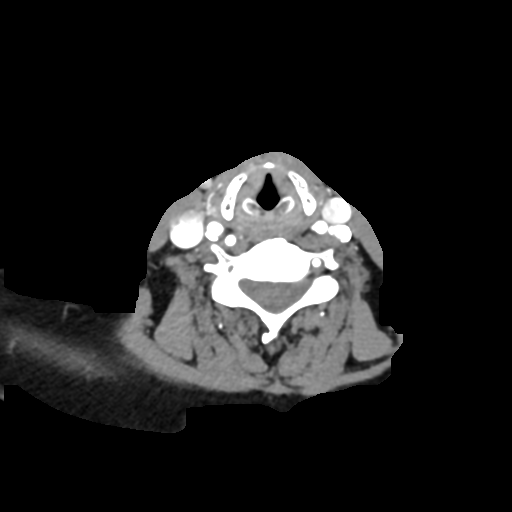
[im 195/293  bone]
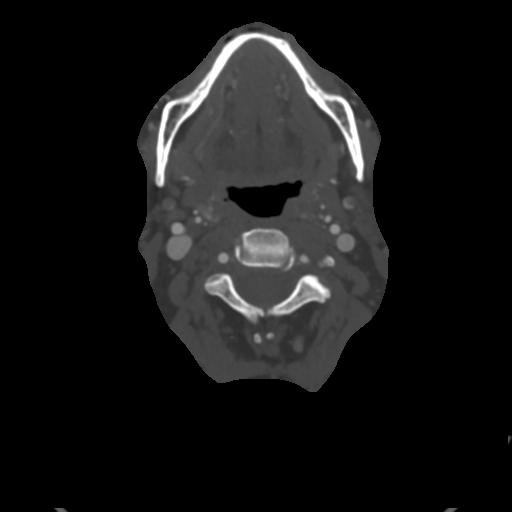
[im 244/293  soft-tissue]
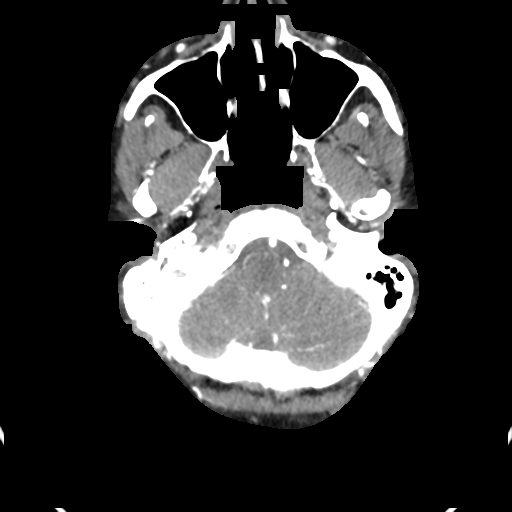

[Series 10: sag thin mips cta neck 1.00 sag · sagittal · 0.40mm/px · 1 of 207 slices shown]
[im 104/207  soft-tissue]
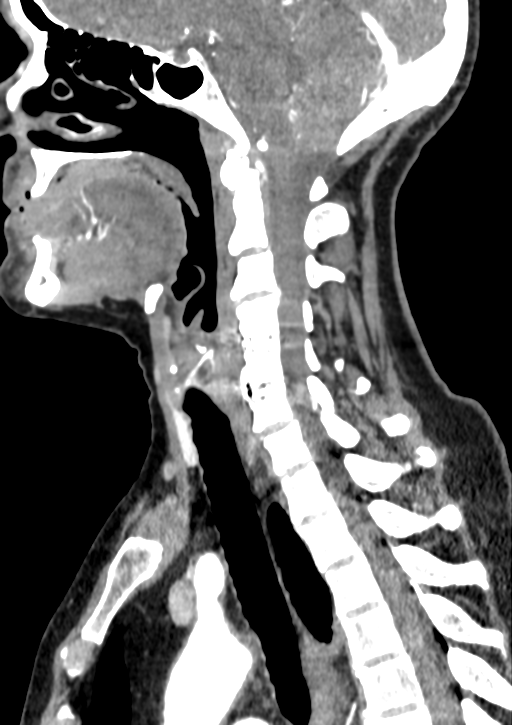

[8 of 33 positions shown; findings below may reference images not displayed]

FINDINGS: Aortic arch: Calcific and noncalcific atherosclerosis of the aorta
and branch vessels.

Right carotid system: Mixed calcific and noncalcific atherosclerosis
of the internal carotid artery origin at the carotid bifurcation and
the proximal internal carotid artery. There is approximately 20%
stenosis at the bifurcation and approximately 40% stenosis of the
proximal ICA.

Left carotid system: Clean [place] predominantly noncalcific
atherosclerosis of the common carotid artery with up to 40% stenosis
of the distal common carotid artery. Mixed calcific and noncalcific
atherosclerosis at the carotid bifurcation with approximately 70%
stenosis of the proximal ICA.

Vertebral arteries: Right dominant. No significant (greater than
50%) stenosis of the right vertebral artery. Severe stenosis of the
proximal left vertebral artery. r

Skeleton: C4-C5 and C5-C6 interbody fusion with osseous fusion
across the disc.

Other neck: No acute abnormality.

Upper chest: Centrilobular and paraseptal emphysema. No
consolidation.
IMPRESSION: 1. Approximately 70% stenosis of the left proximal internal carotid
artery and 40% stenosis of the left common carotid artery.
2. Severe stenosis of the proximal nondominant left vertebral artery
3. Approximate 40% stenosis of the right proximal internal carotid
artery.
4. Centrilobular and paraseptal emphysema.

## 2022-04-27 ENCOUNTER — Other Ambulatory Visit (INDEPENDENT_AMBULATORY_CARE_PROVIDER_SITE_OTHER): Payer: Self-pay | Admitting: Nurse Practitioner

## 2022-06-21 DIAGNOSIS — J302 Other seasonal allergic rhinitis: Secondary | ICD-10-CM | POA: Diagnosis not present

## 2022-06-21 DIAGNOSIS — E785 Hyperlipidemia, unspecified: Secondary | ICD-10-CM | POA: Diagnosis not present

## 2022-06-21 DIAGNOSIS — K219 Gastro-esophageal reflux disease without esophagitis: Secondary | ICD-10-CM | POA: Diagnosis not present

## 2022-06-21 DIAGNOSIS — K580 Irritable bowel syndrome with diarrhea: Secondary | ICD-10-CM | POA: Diagnosis not present

## 2022-06-21 DIAGNOSIS — R7303 Prediabetes: Secondary | ICD-10-CM | POA: Diagnosis not present

## 2022-06-21 DIAGNOSIS — I6522 Occlusion and stenosis of left carotid artery: Secondary | ICD-10-CM | POA: Diagnosis not present

## 2022-06-21 DIAGNOSIS — I10 Essential (primary) hypertension: Secondary | ICD-10-CM | POA: Diagnosis not present

## 2022-07-20 DIAGNOSIS — F411 Generalized anxiety disorder: Secondary | ICD-10-CM | POA: Diagnosis not present

## 2022-07-20 DIAGNOSIS — I10 Essential (primary) hypertension: Secondary | ICD-10-CM | POA: Diagnosis not present

## 2022-07-20 DIAGNOSIS — R69 Illness, unspecified: Secondary | ICD-10-CM | POA: Diagnosis not present

## 2022-07-31 ENCOUNTER — Other Ambulatory Visit (INDEPENDENT_AMBULATORY_CARE_PROVIDER_SITE_OTHER): Payer: Self-pay | Admitting: Nurse Practitioner

## 2022-08-14 DIAGNOSIS — F319 Bipolar disorder, unspecified: Secondary | ICD-10-CM | POA: Diagnosis not present

## 2022-08-14 DIAGNOSIS — I10 Essential (primary) hypertension: Secondary | ICD-10-CM | POA: Diagnosis not present

## 2022-08-14 DIAGNOSIS — F332 Major depressive disorder, recurrent severe without psychotic features: Secondary | ICD-10-CM | POA: Diagnosis not present

## 2022-09-06 ENCOUNTER — Ambulatory Visit (INDEPENDENT_AMBULATORY_CARE_PROVIDER_SITE_OTHER): Payer: BLUE CROSS/BLUE SHIELD | Admitting: Nurse Practitioner

## 2022-09-06 ENCOUNTER — Encounter (INDEPENDENT_AMBULATORY_CARE_PROVIDER_SITE_OTHER): Payer: BLUE CROSS/BLUE SHIELD

## 2022-09-15 ENCOUNTER — Emergency Department: Payer: 59

## 2022-09-15 ENCOUNTER — Other Ambulatory Visit: Payer: Self-pay

## 2022-09-15 ENCOUNTER — Encounter: Payer: Self-pay | Admitting: Intensive Care

## 2022-09-15 ENCOUNTER — Emergency Department
Admission: EM | Admit: 2022-09-15 | Discharge: 2022-09-15 | Disposition: A | Discharge status: Home / Self Care | Payer: 59 | Attending: Emergency Medicine | Admitting: Emergency Medicine

## 2022-09-15 DIAGNOSIS — J069 Acute upper respiratory infection, unspecified: Secondary | ICD-10-CM | POA: Diagnosis not present

## 2022-09-15 DIAGNOSIS — R062 Wheezing: Secondary | ICD-10-CM | POA: Diagnosis not present

## 2022-09-15 DIAGNOSIS — B9789 Other viral agents as the cause of diseases classified elsewhere: Secondary | ICD-10-CM | POA: Diagnosis not present

## 2022-09-15 DIAGNOSIS — Z1152 Encounter for screening for COVID-19: Secondary | ICD-10-CM | POA: Insufficient documentation

## 2022-09-15 DIAGNOSIS — F17210 Nicotine dependence, cigarettes, uncomplicated: Secondary | ICD-10-CM | POA: Diagnosis not present

## 2022-09-15 DIAGNOSIS — R059 Cough, unspecified: Secondary | ICD-10-CM | POA: Diagnosis not present

## 2022-09-15 LAB — RESP PANEL BY RT-PCR (RSV, FLU A&B, COVID)  RVPGX2
Influenza A by PCR: NEGATIVE
Influenza B by PCR: NEGATIVE
Resp Syncytial Virus by PCR: NEGATIVE
SARS Coronavirus 2 by RT PCR: NEGATIVE

## 2022-09-15 MED ORDER — PREDNISONE 10 MG PO TABS: ORAL_TABLET | ORAL | 0 refills | Status: AC

## 2022-09-15 NOTE — ED Provider Notes (Signed)
[facility] Provider Note    Event Date/Time   First MD Initiated Contact with Patient 09/15/22 [phone]     (approximate)   History   Cough   HPI  Katrina Holmes is a 67 y.o. female   presents to the ED with complaint of respiratory symptoms that began 5 days ago.  Patient reports intermittent fever subjectively.  Patient is a smoker but states she is trying to cut back from 1 pack a day.  No nausea, vomiting or diarrhea.  She is unaware of any known sick exposures.  Patient does have an albuterol inhaler that she has been using but has noticed some wheezing.  Denies history of hypertension, carotid stenosis, degenerative disc disease, depression, bipolar 1 disorder, PTSD, polysubstance abuse.      Physical Exam   Triage Vital Signs: ED Triage Vitals  Enc Vitals Group     BP 09/15/22 0753 137/75     Pulse Rate 09/15/22 0753 94     Resp 09/15/22 0753 18     Temp 09/15/22 0753 99.8 F (37.7 C)     Temp Source 09/15/22 0753 Oral     SpO2 09/15/22 0753 94 %     Weight 09/15/22 0751 135 lb (61.2 kg)     Height 09/15/22 0751 5\' 6"  (1.676 m)     Head Circumference --      Peak Flow --      Pain Score 09/15/22 0751 6     Pain Loc --      Pain Edu? --      Excl. in [place]? --     Most recent vital signs: Vitals:   09/15/22 0753  BP: 137/75  Pulse: 94  Resp: 18  Temp: 99.8 F (37.7 C)  SpO2: 94%     General: Awake, no distress.  Alert, talkative, able to speak in complete sentences without any difficulty. CV:  Good peripheral perfusion.  Heart regular rate and rhythm. Resp:  Normal effort.  Lungs with expiratory wheeze bilaterally.  No accessory muscles being used. Abd:  No distention.  Other:  Minimal nasal congestion.   ED Results / Procedures / Treatments   Labs (all labs ordered are listed, but only abnormal results are displayed) Labs Reviewed  RESP PANEL BY RT-PCR (RSV, FLU A&B, COVID)  [id number]     RADIOLOGY Chest x-ray images  were reviewed and interpreted by myself independent of the radiologist and no infiltrate was noted.  Official radiology report is no active cardiopulmonary disease.    PROCEDURES:  Critical Care performed:   Procedures   MEDICATIONS ORDERED IN ED: Medications - No data to display   IMPRESSION / MDM / [place] / ED COURSE  I reviewed the triage vital signs and the nursing notes.   Differential diagnosis includes, but is not limited to, COVID, influenza, RSV, pneumonia, bronchitis,  67 year old female presents to the ED with complaint of cough, low-grade temp and wheezing.  Patient is a smoker and smokes 1 pack cigarettes per day but has been trying to cut back.  She also has an inhaler that she has been using but denies history of asthma which makes me wonder if she has COPD that she is not aware of.  Chest x-ray was clear.  Patient was made aware that her respiratory panel was negative for COVID, influenza and RSV.  A prescription for prednisone was sent to the pharmacy and she is instructed to continue using her albuterol inhaler.  She has to follow-up with her PCP or return to the emergency department over the holiday weekend if there is any severe worsening of her symptoms.      Patient's presentation is most consistent with acute complicated illness / injury requiring diagnostic workup.  FINAL CLINICAL IMPRESSION(S) / ED DIAGNOSES   Final diagnoses:  Viral URI with cough     Rx / DC Orders   ED Discharge Orders          Ordered    predniSONE (DELTASONE) 10 MG tablet        09/15/22 [id number]             Note:  This document was prepared using Dragon voice recognition software and may include unintentional dictation errors.   Johnn Hai, PA-C 09/15/22 1113    Naaman Plummer, MD 09/15/22 [phone]

## 2022-09-15 NOTE — ED Notes (Signed)
Pt given discharge instructions, pt voiced understanding of instructions. Pt unable to sign due to pen pad not working. 

## 2022-09-15 NOTE — ED Triage Notes (Signed)
Patient c/o cough and upper respiratory symptoms since Sunday. Reports intermittent fevers

## 2022-09-15 NOTE — Discharge Instructions (Signed)
Follow-up with your primary care fighter if any continued problems or concerns.  A prescription for prednisone was sent to the pharmacy for you to begin taking today and tapering down over the next 6 days.  This medication will help with wheezing.  Continue using your albuterol inhaler as needed for wheezing as well or if you have any shortness of breath or persistent cough.  Increase fluids to stay hydrated.

## 2022-11-01 ENCOUNTER — Other Ambulatory Visit (INDEPENDENT_AMBULATORY_CARE_PROVIDER_SITE_OTHER): Payer: Self-pay | Admitting: Nurse Practitioner

## 2022-11-20 ENCOUNTER — Other Ambulatory Visit (INDEPENDENT_AMBULATORY_CARE_PROVIDER_SITE_OTHER): Payer: Self-pay | Admitting: Nurse Practitioner

## 2022-11-20 DIAGNOSIS — F1211 Cannabis abuse, in remission: Secondary | ICD-10-CM | POA: Diagnosis not present

## 2022-11-20 DIAGNOSIS — F5105 Insomnia due to other mental disorder: Secondary | ICD-10-CM | POA: Diagnosis not present

## 2022-11-20 DIAGNOSIS — I6523 Occlusion and stenosis of bilateral carotid arteries: Secondary | ICD-10-CM

## 2022-11-20 DIAGNOSIS — F411 Generalized anxiety disorder: Secondary | ICD-10-CM | POA: Diagnosis not present

## 2022-11-20 DIAGNOSIS — F4312 Post-traumatic stress disorder, chronic: Secondary | ICD-10-CM | POA: Diagnosis not present

## 2022-11-20 DIAGNOSIS — F39 Unspecified mood [affective] disorder: Secondary | ICD-10-CM | POA: Diagnosis not present

## 2022-11-20 DIAGNOSIS — F1321 Sedative, hypnotic or anxiolytic dependence, in remission: Secondary | ICD-10-CM | POA: Diagnosis not present

## 2022-11-20 DIAGNOSIS — F1111 Opioid abuse, in remission: Secondary | ICD-10-CM | POA: Diagnosis not present

## 2022-11-20 DIAGNOSIS — F1021 Alcohol dependence, in remission: Secondary | ICD-10-CM | POA: Diagnosis not present

## 2022-11-22 ENCOUNTER — Ambulatory Visit (INDEPENDENT_AMBULATORY_CARE_PROVIDER_SITE_OTHER): Payer: PPO

## 2022-11-22 ENCOUNTER — Encounter (INDEPENDENT_AMBULATORY_CARE_PROVIDER_SITE_OTHER): Payer: Self-pay | Admitting: Nurse Practitioner

## 2022-11-22 ENCOUNTER — Ambulatory Visit (INDEPENDENT_AMBULATORY_CARE_PROVIDER_SITE_OTHER): Payer: PPO | Admitting: Nurse Practitioner

## 2022-11-22 VITALS — BP 134/66 | HR 75 | Resp 18 | Ht 66.0 in | Wt 127.2 lb

## 2022-11-22 DIAGNOSIS — I6523 Occlusion and stenosis of bilateral carotid arteries: Secondary | ICD-10-CM

## 2022-11-22 DIAGNOSIS — F17200 Nicotine dependence, unspecified, uncomplicated: Secondary | ICD-10-CM | POA: Diagnosis not present

## 2022-11-22 DIAGNOSIS — E782 Mixed hyperlipidemia: Secondary | ICD-10-CM

## 2022-11-22 DIAGNOSIS — I10 Essential (primary) hypertension: Secondary | ICD-10-CM | POA: Diagnosis not present

## 2022-11-22 NOTE — Progress Notes (Signed)
Subjective:    Patient ID: Katrina Holmes, female    DOB: [birth year], 67 y.o.   MRN: [id number] Chief Complaint  Patient presents with   Follow-up    2wks. [facility]. post carotid stent. carotid.- pt states that she had the stent and has not been able to get back in due to insurance reasons**    The patient is seen for follow up evaluation of carotid stenosis status post left carotid stent on 12/01/2020.  This is the patient's first follow-up.  The patient was unable to follow-up after her intervention due to issues with insurance.  She notes there were no post operative problems or complications related to the surgery.  The patient denies neck or incisional pain.  The patient denies interval amaurosis fugax. There is no recent history of TIA symptoms or focal motor deficits. There is no prior documented CVA.  The patient denies headache.  The patient is taking 75 mg Plavix daily  No recent shortening of the patient's walking distance or new symptoms consistent with claudication.  No history of rest pain symptoms. No new ulcers or wounds of the lower extremities have occurred.  There is no history of DVT, PE or superficial thrombophlebitis. No recent episodes of angina or shortness of breath documented.   Prior to intervention the patient had a greater than 90% stenosis of the left ICA.  Today the patient has a 40 to 59% stenosis of the left CCA/ICA and 40 to 59% stenosis of the right ICA.  Bilateral vertebral arteries have antegrade flow with normal flow hemodynamics in the bilateral subclavian arteries.     Review of Systems  All other systems reviewed and are negative.      Objective:   Physical Exam Vitals reviewed.  HENT:     Head: Normocephalic.  Neck:     Vascular: No carotid bruit.  Cardiovascular:     Rate and Rhythm: Normal rate and regular rhythm.     Pulses:          Radial pulses are 2+ on the right side and 2+ on the left side.       Dorsalis pedis pulses are  2+ on the right side and 2+ on the left side.       Posterior tibial pulses are 2+ on the right side and 2+ on the left side.  Pulmonary:     Effort: Pulmonary effort is normal.  Skin:    General: Skin is warm and dry.  Neurological:     Mental Status: She is alert and oriented to person, place, and time.  Psychiatric:        Mood and Affect: Mood normal.        Behavior: Behavior normal.        Thought Content: Thought content normal.        Judgment: Judgment normal.     BP 134/66 (BP Location: Left Arm)   Pulse 75   Resp 18   Ht 5\' 6"  (1.676 m)   Wt 127 lb 3.2 oz (57.7 kg)   BMI 20.53 kg/m   Past Medical History:  Diagnosis Date   Allergy    Anxiety    Degenerative disc disease, lumbar    Depression    Hyperlipidemia    Hypertension    IBS (irritable bowel syndrome) 15 years   PTSD (post-traumatic stress disorder)    Sciatica    Sciatica    Suicide attempt [facility])     Social History  Socioeconomic History   Marital status: Married    Spouse name: robert   Number of children: 4   Years of education: Not on file   Highest education level: Not on file  Occupational History   Not on file  Tobacco Use   Smoking status: Every Day    Packs/day: 1    Types: Cigarettes   Smokeless tobacco: Never  Vaping Use   Vaping Use: Never used  Substance and Sexual Activity   Alcohol use: No   Drug use: No   Sexual activity: Not on file  Other Topics Concern   Not on file  Social History Narrative   Not on file   Social Determinants of Health   Financial Resource Strain: Low Risk  (01/27/2019)   Overall Financial Resource Strain (CARDIA)    Difficulty of Paying Living Expenses: Not hard at all  Food Insecurity: No Food Insecurity (01/27/2019)   Hunger Vital Sign    Worried About Running Out of Food in the Last Year: Never true    Ran Out of Food in the Last Year: Never true  Transportation Needs: No Transportation Needs (01/27/2019)   PRAPARE - Therapist, art (Medical): No    Lack of Transportation (Non-Medical): No  Physical Activity: Inactive (01/27/2019)   Exercise Vital Sign    Days of Exercise per Week: 0 days    Minutes of Exercise per Session: 0 min  Stress: Not on file  Social Connections: Unknown (01/27/2019)   Social Connection and Isolation Panel [NHANES]    Frequency of Communication with Friends and Family: Not on file    Frequency of Social Gatherings with Friends and Family: Not on file    Attends Religious Services: Never    Active Member of Clubs or Organizations: No    Attends Banker Meetings: Never    Marital Status: Married  Catering manager Violence: Not At Risk (01/27/2019)   Humiliation, Afraid, Rape, and Kick questionnaire    Fear of Current or Ex-Partner: No    Emotionally Abused: No    Physically Abused: No    Sexually Abused: No    Past Surgical History:  Procedure Laterality Date   ABDOMINAL HYSTERECTOMY     CAROTID PTA/STENT INTERVENTION Left 12/01/2020   Procedure: CAROTID PTA/STENT INTERVENTION;  Surgeon: Gilda Crease Latina Craver, MD;  Location: [facility] INVASIVE CV [facility];  Service: Cardiovascular;  Laterality: Left;   MANDIBLE SURGERY     NECK SURGERY  2006   SHOULDER ARTHROSCOPY WITH ROTATOR CUFF REPAIR Left    3 surgeries (2006-2007-2008)    Family History  Problem Relation Age of Onset   Heart disease Mother    Stroke Father    Alcohol abuse Father    Alcohol abuse Sister    Alcohol abuse Brother    Alcohol abuse Sister    Alcohol abuse Sister     Allergies  Allergen Reactions   Lisinopril Nausea Only and Swelling    Dehydration Tongue swelling Dehydration Tongue swelling    Peanut Oil Other (See Comments)    Admitted for anaphylaxis on 01/11/17 after reporting exposure to peanut butter per outside records. Admitted for anaphylaxis on 01/11/17 after reporting exposure to peanut butter per outside records. Admitted for anaphylaxis on 01/11/17 after reporting  exposure to peanut butter per outside records.    Gabapentin Other (See Comments)    Bloating  Bloating   Nsaids Nausea And Vomiting   Tramadol Nausea And Vomiting   Trazodone  And Nefazodone Diarrhea    And hallucinations    Penicillins Rash    .Has patient had a PCN reaction causing immediate rash, facial/tongue/throat swelling, SOB or lightheadedness with hypotension: Unknown Has patient had a PCN reaction causing severe rash involving mucus membranes or skin necrosis: Unknown Has patient had a PCN reaction that required hospitalization: Unknown Has patient had a PCN reaction occurring within the last 10 years: Unknown If all of the above answers are "NO", then may proceed with Cephalosporin use.        Latest Ref Rng & Units 12/02/2020    5:57 AM 03/09/2019    3:42 PM 07/14/2018    1:51 PM  CBC  WBC 4.0 - 10.5 K/uL 7.3  10.7  9.6   Hemoglobin 12.0 - 15.0 g/dL 16.1  09.6  04.5   Hematocrit 36.0 - 46.0 % 34.4  39.1  41.3   Platelets 150 - 400 K/uL 273  373  426       CMP     Component Value Date/Time   NA 141 12/02/2020 0557   NA 140 10/03/2014 1342   K 4.4 12/02/2020 0557   K 3.7 10/03/2014 1342   CL 111 12/02/2020 0557   CL 105 10/03/2014 1342   CO2 27 12/02/2020 0557   CO2 25 10/03/2014 1342   GLUCOSE 103 (H) 12/02/2020 0557   GLUCOSE 106 (H) 10/03/2014 1342   BUN 8 12/02/2020 0557   BUN 13 10/03/2014 1342   CREATININE 0.49 12/02/2020 0557   CREATININE 0.65 10/03/2014 1342   CALCIUM 8.7 (L) 12/02/2020 0557   CALCIUM 9.5 10/03/2014 1342   PROT 8.1 03/09/2019 1542   ALBUMIN 4.5 03/09/2019 1542   AST 24 03/09/2019 1542   ALT 13 03/09/2019 1542   ALKPHOS 59 03/09/2019 1542   BILITOT 0.5 03/09/2019 1542   GFRNONAA >60 12/02/2020 0557   GFRNONAA >60 10/03/2014 1342     No results found.     Assessment & Plan:   1. Bilateral carotid artery stenosis Recommend:  The patient is s/p successful left carotid stent  Duplex ultrasound  shows 40-59%  stenosis  bilaterally   Continue antiplatelet therapy as prescribed Continue management of CAD, HTN and Hyperlipidemia Healthy heart diet,  encouraged exercise at least 4 times per week  The patient's NIHSS score is as follows: 0 Mild: 1 - 5 Mild to Moderately Severe: 5 - 14 Severe: 15 - 24 Very Severe: >25  Follow up in 12 months with duplex ultrasound and physical exam   2. Primary hypertension Continue antihypertensive medications as already ordered, these medications have been reviewed and there are no changes at this time.  3. Mixed hyperlipidemia Currently patient not on statin.  Notes that she has an upcoming visit with her PCP and will be attempting to obtain a nonstatin alternative to help with hyperlipidemia.  4. Tobacco use disorder Smoking cessation was discussed, 3-10 minutes spent on this topic specificallySmoking cessation was discussed, 3-10 minutes spent on this topic specifically   Current Outpatient Medications on File Prior to Visit  Medication Sig Dispense Refill   acetaminophen (TYLENOL) 500 MG tablet Take 1,000 mg by mouth every 4 (four) hours as needed for moderate pain or fever.     albuterol (VENTOLIN HFA) 108 (90 Base) MCG/ACT inhaler Inhale 2 puffs into the lungs every 4 (four) hours as needed for wheezing.     amLODipine (NORVASC) 5 MG tablet Take 5 mg by mouth daily.     buprenorphine-naloxone (SUBOXONE)  8-2 mg SUBL SL tablet Place 1 tablet under the tongue 2 (two) times daily.     citalopram (CELEXA) 20 MG tablet Take 20 mg by mouth every morning.     clopidogrel (PLAVIX) 75 MG tablet TAKE 1 TABLET BY MOUTH EVERY DAY 30 tablet 2   cyclobenzaprine (FLEXERIL) 10 MG tablet Take 10 mg by mouth daily as needed for muscle spasms.     dicyclomine (BENTYL) 20 MG tablet Take 20 mg by mouth as needed.     escitalopram (LEXAPRO) 20 MG tablet Take 20 mg by mouth at bedtime.     fluticasone (FLONASE) 50 MCG/ACT nasal spray Place 1 spray into both nostrils daily as needed  for allergies.  1   lansoprazole (PREVACID) 15 MG capsule Take 30 mg by mouth daily at 12 noon.     loratadine (CLARITIN) 10 MG tablet Take 10 mg by mouth daily.     Multiple Vitamins-Minerals (MULTIVITAMIN WITH MINERALS) tablet Take 1 tablet by mouth daily.     pantoprazole (PROTONIX) 40 MG tablet Take 1 tablet (40 mg total) by mouth daily. 30 tablet 11   pravastatin (PRAVACHOL) 20 MG tablet Take 20 mg by mouth at bedtime.     QUEtiapine (SEROQUEL) 25 MG tablet Take 25 mg by mouth at bedtime.     predniSONE (DELTASONE) 10 MG tablet Take 6 tablets  today, on day 2 take 5 tablets, day 3 take 4 tablets, day 4 take 3 tablets, day 5 take  2 tablets and 1 tablet the last day (Patient not taking: Reported on 11/22/2022) 21 tablet 0   No current facility-administered medications on file prior to visit.    There are no Patient Instructions on file for this visit. No follow-ups on file.   Georgiana Spinner, NP

## 2022-11-27 DIAGNOSIS — F1211 Cannabis abuse, in remission: Secondary | ICD-10-CM | POA: Diagnosis not present

## 2022-11-27 DIAGNOSIS — F411 Generalized anxiety disorder: Secondary | ICD-10-CM | POA: Diagnosis not present

## 2022-11-27 DIAGNOSIS — F1111 Opioid abuse, in remission: Secondary | ICD-10-CM | POA: Diagnosis not present

## 2022-11-27 DIAGNOSIS — F39 Unspecified mood [affective] disorder: Secondary | ICD-10-CM | POA: Diagnosis not present

## 2022-12-03 DIAGNOSIS — F1321 Sedative, hypnotic or anxiolytic dependence, in remission: Secondary | ICD-10-CM | POA: Diagnosis not present

## 2022-12-03 DIAGNOSIS — F4312 Post-traumatic stress disorder, chronic: Secondary | ICD-10-CM | POA: Diagnosis not present

## 2022-12-03 DIAGNOSIS — F1111 Opioid abuse, in remission: Secondary | ICD-10-CM | POA: Diagnosis not present

## 2022-12-03 DIAGNOSIS — F1021 Alcohol dependence, in remission: Secondary | ICD-10-CM | POA: Diagnosis not present

## 2022-12-03 DIAGNOSIS — F1211 Cannabis abuse, in remission: Secondary | ICD-10-CM | POA: Diagnosis not present

## 2022-12-03 DIAGNOSIS — F411 Generalized anxiety disorder: Secondary | ICD-10-CM | POA: Diagnosis not present

## 2022-12-03 DIAGNOSIS — F39 Unspecified mood [affective] disorder: Secondary | ICD-10-CM | POA: Diagnosis not present

## 2022-12-03 DIAGNOSIS — F5105 Insomnia due to other mental disorder: Secondary | ICD-10-CM | POA: Diagnosis not present

## 2022-12-08 DIAGNOSIS — F39 Unspecified mood [affective] disorder: Secondary | ICD-10-CM | POA: Diagnosis not present

## 2022-12-08 DIAGNOSIS — F1111 Opioid abuse, in remission: Secondary | ICD-10-CM | POA: Diagnosis not present

## 2022-12-08 DIAGNOSIS — F411 Generalized anxiety disorder: Secondary | ICD-10-CM | POA: Diagnosis not present

## 2022-12-08 DIAGNOSIS — F1321 Sedative, hypnotic or anxiolytic dependence, in remission: Secondary | ICD-10-CM | POA: Diagnosis not present

## 2022-12-08 DIAGNOSIS — F5105 Insomnia due to other mental disorder: Secondary | ICD-10-CM | POA: Diagnosis not present

## 2022-12-08 DIAGNOSIS — F1211 Cannabis abuse, in remission: Secondary | ICD-10-CM | POA: Diagnosis not present

## 2022-12-08 DIAGNOSIS — F1021 Alcohol dependence, in remission: Secondary | ICD-10-CM | POA: Diagnosis not present

## 2022-12-08 DIAGNOSIS — F4312 Post-traumatic stress disorder, chronic: Secondary | ICD-10-CM | POA: Diagnosis not present

## 2022-12-13 DIAGNOSIS — R7309 Other abnormal glucose: Secondary | ICD-10-CM | POA: Diagnosis not present

## 2022-12-13 DIAGNOSIS — Z0000 Encounter for general adult medical examination without abnormal findings: Secondary | ICD-10-CM | POA: Diagnosis not present

## 2022-12-13 DIAGNOSIS — I10 Essential (primary) hypertension: Secondary | ICD-10-CM | POA: Diagnosis not present

## 2022-12-20 DIAGNOSIS — F411 Generalized anxiety disorder: Secondary | ICD-10-CM | POA: Diagnosis not present

## 2022-12-20 DIAGNOSIS — F332 Major depressive disorder, recurrent severe without psychotic features: Secondary | ICD-10-CM | POA: Diagnosis not present

## 2022-12-20 DIAGNOSIS — Z0000 Encounter for general adult medical examination without abnormal findings: Secondary | ICD-10-CM | POA: Diagnosis not present

## 2022-12-20 DIAGNOSIS — K219 Gastro-esophageal reflux disease without esophagitis: Secondary | ICD-10-CM | POA: Diagnosis not present

## 2022-12-20 DIAGNOSIS — I6522 Occlusion and stenosis of left carotid artery: Secondary | ICD-10-CM | POA: Diagnosis not present

## 2022-12-20 DIAGNOSIS — F17210 Nicotine dependence, cigarettes, uncomplicated: Secondary | ICD-10-CM | POA: Diagnosis not present

## 2022-12-20 DIAGNOSIS — E785 Hyperlipidemia, unspecified: Secondary | ICD-10-CM | POA: Diagnosis not present

## 2022-12-20 DIAGNOSIS — I10 Essential (primary) hypertension: Secondary | ICD-10-CM | POA: Diagnosis not present

## 2022-12-20 DIAGNOSIS — Z13820 Encounter for screening for osteoporosis: Secondary | ICD-10-CM | POA: Diagnosis not present

## 2022-12-20 DIAGNOSIS — F319 Bipolar disorder, unspecified: Secondary | ICD-10-CM | POA: Diagnosis not present

## 2022-12-20 DIAGNOSIS — M5136 Other intervertebral disc degeneration, lumbar region: Secondary | ICD-10-CM | POA: Diagnosis not present

## 2022-12-25 ENCOUNTER — Other Ambulatory Visit (INDEPENDENT_AMBULATORY_CARE_PROVIDER_SITE_OTHER): Payer: Self-pay | Admitting: Nurse Practitioner

## 2022-12-25 ENCOUNTER — Other Ambulatory Visit: Payer: Self-pay | Admitting: Family Medicine

## 2022-12-25 DIAGNOSIS — F17210 Nicotine dependence, cigarettes, uncomplicated: Secondary | ICD-10-CM

## 2022-12-25 DIAGNOSIS — Z0000 Encounter for general adult medical examination without abnormal findings: Secondary | ICD-10-CM

## 2023-01-03 ENCOUNTER — Ambulatory Visit
Admission: RE | Admit: 2023-01-03 | Discharge: 2023-01-03 | Disposition: A | Discharge status: Home / Self Care | Payer: PPO | Source: Ambulatory Visit | Attending: Family Medicine | Admitting: Family Medicine

## 2023-01-03 DIAGNOSIS — F17210 Nicotine dependence, cigarettes, uncomplicated: Secondary | ICD-10-CM | POA: Insufficient documentation

## 2023-01-03 DIAGNOSIS — Z0000 Encounter for general adult medical examination without abnormal findings: Secondary | ICD-10-CM | POA: Diagnosis not present

## 2023-01-12 DIAGNOSIS — M8588 Other specified disorders of bone density and structure, other site: Secondary | ICD-10-CM | POA: Diagnosis not present

## 2023-02-05 DIAGNOSIS — F411 Generalized anxiety disorder: Secondary | ICD-10-CM | POA: Diagnosis not present

## 2023-02-05 DIAGNOSIS — F1111 Opioid abuse, in remission: Secondary | ICD-10-CM | POA: Diagnosis not present

## 2023-02-05 DIAGNOSIS — F39 Unspecified mood [affective] disorder: Secondary | ICD-10-CM | POA: Diagnosis not present

## 2023-02-05 DIAGNOSIS — F1021 Alcohol dependence, in remission: Secondary | ICD-10-CM | POA: Diagnosis not present

## 2023-02-05 DIAGNOSIS — F1321 Sedative, hypnotic or anxiolytic dependence, in remission: Secondary | ICD-10-CM | POA: Diagnosis not present

## 2023-02-05 DIAGNOSIS — F4312 Post-traumatic stress disorder, chronic: Secondary | ICD-10-CM | POA: Diagnosis not present

## 2023-02-05 DIAGNOSIS — F1211 Cannabis abuse, in remission: Secondary | ICD-10-CM | POA: Diagnosis not present

## 2023-02-05 DIAGNOSIS — F5105 Insomnia due to other mental disorder: Secondary | ICD-10-CM | POA: Diagnosis not present

## 2023-03-26 DIAGNOSIS — E785 Hyperlipidemia, unspecified: Secondary | ICD-10-CM | POA: Diagnosis not present

## 2023-03-26 DIAGNOSIS — I10 Essential (primary) hypertension: Secondary | ICD-10-CM | POA: Diagnosis not present

## 2023-04-04 ENCOUNTER — Encounter: Payer: Self-pay | Admitting: Psychiatry

## 2023-04-04 ENCOUNTER — Ambulatory Visit (INDEPENDENT_AMBULATORY_CARE_PROVIDER_SITE_OTHER): Payer: PPO | Admitting: Psychiatry

## 2023-04-04 VITALS — BP 184/82 | HR 74 | Ht 67.0 in | Wt 144.0 lb

## 2023-04-04 DIAGNOSIS — F411 Generalized anxiety disorder: Secondary | ICD-10-CM | POA: Diagnosis not present

## 2023-04-04 DIAGNOSIS — Z9189 Other specified personal risk factors, not elsewhere classified: Secondary | ICD-10-CM | POA: Diagnosis not present

## 2023-04-04 DIAGNOSIS — F4310 Post-traumatic stress disorder, unspecified: Secondary | ICD-10-CM | POA: Diagnosis not present

## 2023-04-04 DIAGNOSIS — F1221 Cannabis dependence, in remission: Secondary | ICD-10-CM

## 2023-04-04 DIAGNOSIS — F3162 Bipolar disorder, current episode mixed, moderate: Secondary | ICD-10-CM

## 2023-04-04 DIAGNOSIS — F1021 Alcohol dependence, in remission: Secondary | ICD-10-CM

## 2023-04-04 MED ORDER — PROPRANOLOL HCL 10 MG PO TABS
10.0000 mg | ORAL_TABLET | Freq: Two times a day (BID) | ORAL | 1 refills | Status: DC | PRN
Start: 2023-04-04 — End: 2023-04-27

## 2023-04-04 MED ORDER — CITALOPRAM HYDROBROMIDE 10 MG PO TABS
5.0000 mg | ORAL_TABLET | Freq: Every day | ORAL | 0 refills | Status: DC
Start: 2023-04-04 — End: 2023-05-23

## 2023-04-04 MED ORDER — MIRTAZAPINE 15 MG PO TABS
22.5000 mg | ORAL_TABLET | Freq: Every day | ORAL | 1 refills | Status: DC
Start: 2023-04-04 — End: 2023-05-25
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
## 2023-04-04 NOTE — Patient Instructions (Addendum)
Please call for EKG - [phone]   Propranolol Tablets What is this medication? PROPRANOLOL (proe PRAN oh lole) treats many conditions such as high blood pressure, tremors, and a type of arrhythmia known as AFib (atrial fibrillation). It works by lowering your blood pressure and heart rate, making it easier for your heart to pump blood to the rest of your body. It may be used to prevent migraine headaches. It works by relaxing the blood vessels in the brain that cause migraines. It belongs to a group of medications called beta blockers. This medicine may be used for other purposes; ask your health care provider or pharmacist if you have questions. COMMON BRAND NAME(S): Inderal What should I tell my care team before I take this medication? They need to know if you have any of these conditions: Diabetes Having surgery Heart or blood vessel conditions, such as slow heartbeat, heart failure, heart block Kidney disease Liver disease Lung or breathing disease, such as asthma or COPD Myasthenia gravis Pheochromocytoma Thyroid disease An unusual or allergic reaction to propranolol, other medications, foods, dyes, or preservatives Pregnant or trying to get pregnant Breastfeeding How should I use this medication? Take this medication by mouth. Take it as directed on the prescription label at the same time every day. Keep taking it unless your care team tells you to stop. Talk to your care team about the use of this medication in children. Special care may be needed. Overdosage: If you think you have taken too much of this medicine contact a poison control center or emergency room at once. NOTE: This medicine is only for you. Do not share this medicine with others. What if I miss a dose? If you miss a dose, take it as soon as you can. If it is almost time for your next dose, take only that dose. Do not take double or extra doses. What may interact with this medication? Do not take this  medication with any of the following: Thioridazine This medication may also interact with the following: Certain medications for blood pressure, heart disease, irregular heartbeat Epinephrine NSAIDs, medications for pain and inflammation, such as ibuprofen or naproxen Warfarin Other medications may affect the way this medication works. Talk with your care team about all of the medications you take. They may suggest changes to your treatment plan to lower the risk of side effects and to make sure your medications work as intended. This list may not describe all possible interactions. Give your health care provider a list of all the medicines, herbs, non-prescription drugs, or dietary supplements you use. Also tell them if you smoke, drink alcohol, or use illegal drugs. Some items may interact with your medicine. What should I watch for while using this medication? Visit your care team for regular checks on your progress. Check your blood pressure as directed. Know what your blood pressure should be and when to contact your care team. This medication may affect your coordination, reaction time, or judgment. Do not drive or operate machinery until you know how this medication affects you. Sit up or stand slowly to reduce the risk of dizzy or fainting spells. Drinking alcohol with this medication can increase the risk of these side effects. Do not suddenly stop taking this medication. This may increase your risk of side effects, such as chest pain and heart attack. If you no longer need to take this medication, your care team will lower the dose slowly over time to decrease the risk of side effects.  If you are going to need surgery or a procedure, tell your care team that you are using this medication. This medication may affect blood glucose levels. It can also mask the symptoms of low blood sugar, such as a rapid heartbeat and tremors. If you have diabetes, it is important to check your blood sugar often  while you are taking this medication. Do not treat yourself for coughs, colds, or pain while you are using this medication without asking your care team for advice. Some medications may increase your blood pressure. What side effects may I notice from receiving this medication? Side effects that you should report to your care team as soon as possible: Allergic reactions--skin rash, itching, hives, swelling of the face, lips, tongue, or throat Heart failure--shortness of breath, swelling of the ankles, feet, or hands, sudden weight gain, unusual weakness or fatigue Low blood pressure--dizziness, feeling faint or lightheaded, blurry vision Raynaud's--cool, numb, or painful fingers or toes that may change color from pale, to blue, to red Redness, blistering, peeling, or loosening of the skin, including inside the mouth Slow heartbeat--dizziness, feeling faint or lightheaded, confusion, trouble breathing, unusual weakness or fatigue Worsening mood, feelings of depression Side effects that usually do not require medical attention (report to your care team if they continue or are bothersome): Change in sex drive or performance Diarrhea Dizziness Fatigue Headache This list may not describe all possible side effects. Call your doctor for medical advice about side effects. You may report side effects to FDA at 1-800-FDA-1088. Where should I keep my medication? Keep out of the reach of children and pets. Store at room temperature between 20 and 25 degrees C (68 and 77 degrees F). Protect from light. Throw away any unused medication after the expiration date. NOTE: This sheet is a summary. It may not cover all possible information. If you have questions about this medicine, talk to your doctor, pharmacist, or health care provider.  2024 Elsevier/Gold Standard (2022-06-05 00:00:00)

## 2023-04-11 DIAGNOSIS — K219 Gastro-esophageal reflux disease without esophagitis: Secondary | ICD-10-CM | POA: Diagnosis not present

## 2023-04-11 DIAGNOSIS — M5442 Lumbago with sciatica, left side: Secondary | ICD-10-CM | POA: Diagnosis not present

## 2023-04-11 DIAGNOSIS — E785 Hyperlipidemia, unspecified: Secondary | ICD-10-CM | POA: Diagnosis not present

## 2023-04-11 DIAGNOSIS — R7303 Prediabetes: Secondary | ICD-10-CM | POA: Diagnosis not present

## 2023-04-11 DIAGNOSIS — I700 Atherosclerosis of aorta: Secondary | ICD-10-CM | POA: Insufficient documentation

## 2023-04-11 DIAGNOSIS — G8929 Other chronic pain: Secondary | ICD-10-CM | POA: Diagnosis not present

## 2023-04-11 DIAGNOSIS — I6522 Occlusion and stenosis of left carotid artery: Secondary | ICD-10-CM | POA: Diagnosis not present

## 2023-04-11 DIAGNOSIS — Z79899 Other long term (current) drug therapy: Secondary | ICD-10-CM | POA: Diagnosis not present

## 2023-04-11 DIAGNOSIS — F332 Major depressive disorder, recurrent severe without psychotic features: Secondary | ICD-10-CM | POA: Diagnosis not present

## 2023-04-11 DIAGNOSIS — I10 Essential (primary) hypertension: Secondary | ICD-10-CM | POA: Diagnosis not present

## 2023-04-11 DIAGNOSIS — F319 Bipolar disorder, unspecified: Secondary | ICD-10-CM | POA: Diagnosis not present

## 2023-04-26 ENCOUNTER — Other Ambulatory Visit: Payer: Self-pay | Admitting: Psychiatry

## 2023-04-26 DIAGNOSIS — F411 Generalized anxiety disorder: Secondary | ICD-10-CM

## 2023-05-23 ENCOUNTER — Encounter: Payer: Self-pay | Admitting: Psychiatry

## 2023-05-23 ENCOUNTER — Ambulatory Visit: Payer: PPO | Admitting: Psychiatry

## 2023-05-23 VITALS — BP 134/80 | HR 76 | Temp 96.9°F | Ht 67.0 in | Wt 154.6 lb

## 2023-05-23 DIAGNOSIS — F1021 Alcohol dependence, in remission: Secondary | ICD-10-CM

## 2023-05-23 DIAGNOSIS — F3162 Bipolar disorder, current episode mixed, moderate: Secondary | ICD-10-CM | POA: Diagnosis not present

## 2023-05-23 DIAGNOSIS — F411 Generalized anxiety disorder: Secondary | ICD-10-CM

## 2023-05-23 DIAGNOSIS — F4310 Post-traumatic stress disorder, unspecified: Secondary | ICD-10-CM

## 2023-05-23 DIAGNOSIS — F1221 Cannabis dependence, in remission: Secondary | ICD-10-CM | POA: Diagnosis not present

## 2023-05-23 DIAGNOSIS — Z9189 Other specified personal risk factors, not elsewhere classified: Secondary | ICD-10-CM

## 2023-05-23 MED ORDER — ARIPIPRAZOLE 15 MG PO TABS: 7.5000 mg | ORAL_TABLET | Freq: Every day | ORAL | 1 refills | Status: DC

## 2023-05-23 NOTE — Progress Notes (Signed)
BH MD OP Progress Note  05/23/2023 12:10 PM Katrina Holmes  MRN:  [id number]  Chief Complaint:  Chief Complaint  Patient presents with   Follow-up   Depression   Anxiety   Sleep problems   Medication Refill   HPI: Katrina Holmes is a 67 year old Caucasian female, married, lives in [place], has a history of bipolar disorder, PTSD, GAD, hypertension, IBS, gastroesophageal reflux disease was evaluated in office today.  The patient presents with complaints of disrupted sleep and mood swings. Despite falling asleep without difficulty, she reports frequent awakenings, typically around 2 AM, resulting in approximately five hours of sleep per night. This pattern has been consistent. She also reports taking naps approximately four times a week, lasting about an hour each time.  Patient does report snoring at night.  She feels tired during the day.  She is currently taking the mirtazapine which was prescribed for sleep and mood which has helped to some extent.  Denies side effects.  She is resistant to referral for sleep study.  In addition to sleep disturbances, the patient experiences mood swings, describing her mood as fluctuating 'up and down.' Despite these challenges, she denies any current use of alcohol or cannabis, both of which she has a history of use now in remission.  Her current medication regimen includes mirtazapine 22.5 mg, Abilify 5 mg, and she has recently tapered off Celexa. She reports no new medications and adherence to her current regimen.  The patient maintains an active lifestyle, including daily walks when weather permits and stretching exercises during colder months. She also engages in cognitive activities such as playing Scrabble.   Patient today appeared to be alert, oriented to person place time and situation.  3 word memory immediate 3 out of 3.  After 5 minutes 3 out of 3.  Patient was able to spell the word 'WORLD' forward and backward.  Attention and focus seem  to be good.    Visit Diagnosis:    ICD-10-CM   1. Bipolar 1 disorder, mixed, moderate (HCC)  F31.62     2. PTSD (post-traumatic stress disorder)  F43.10 ARIPiprazole (ABILIFY) 15 MG tablet    3. GAD (generalized anxiety disorder)  F41.1     4. Alcohol use disorder, moderate, in sustained remission (HCC)  F10.21     5. Cannabis use disorder, severe, in sustained remission (HCC)  F12.21     6. At risk for prolonged QT interval syndrome  Z91.89       Past Psychiatric History: I have reviewed past psychiatric history from progress note on 04/04/2023.  Past trials of medications like BuSpar-side effects, Prozac-tardive dyskinesia, Zyprexa, clonazepam, Celexa-GI side effects, Lexapro-GI side effects, lithium-side effect.  Past Medical History:  Past Medical History:  Diagnosis Date   Allergy    Anxiety    Degenerative disc disease, lumbar    Depression    Hyperlipidemia    Hypertension    IBS (irritable bowel syndrome) 15 years   PTSD (post-traumatic stress disorder)    Sciatica    Sciatica    Suicide attempt [facility])     Past Surgical History:  Procedure Laterality Date   ABDOMINAL HYSTERECTOMY     CAROTID PTA/STENT INTERVENTION Left 12/01/2020   Procedure: CAROTID PTA/STENT INTERVENTION;  Surgeon: Renford Dills, MD;  Location: [facility] INVASIVE CV LAB;  Service: Cardiovascular;  Laterality: Left;   MANDIBLE SURGERY     NECK SURGERY  2006   SHOULDER ARTHROSCOPY WITH ROTATOR CUFF REPAIR Left  3 surgeries (2006-2007-2008)    Family Psychiatric History: Reviewed family psychiatric history from progress note on 04/04/2023.  Family History:  Family History  Problem Relation Age of Onset   Drug abuse Mother    Heart disease Mother    Mental illness Mother    Stroke Father    Alcohol abuse Father    Alcohol abuse Sister    Alcohol abuse Sister    Alcohol abuse Sister    Alcohol abuse Brother     Social History: Reviewed social history from progress note on  04/04/2023. Social History   Socioeconomic History   Marital status: Married    Spouse name: robert   Number of children: 4   Years of education: Not on file   Highest education level: Not on file  Occupational History    Comment: Disability  Tobacco Use   Smoking status: Former    Current packs/day: 1.00    Types: Cigarettes   Smokeless tobacco: Never  Vaping Use   Vaping status: Never Used  Substance and Sexual Activity   Alcohol use: No   Drug use: No   Sexual activity: Not on file  Other Topics Concern   Not on file  Social History Narrative   Not on file   Social Determinants of Health   Financial Resource Strain: Low Risk  (12/20/2022)   Received from [facility], [facility]   Overall Financial Resource Strain (CARDIA)    Difficulty of Paying Living Expenses: Not hard at all  Food Insecurity: No Food Insecurity (12/20/2022)   Received from [facility], [facility]   Hunger Vital Sign    Worried About Running Out of Food in the Last Year: Never true    Ran Out of Food in the Last Year: Never true  Transportation Needs: No Transportation Needs (12/20/2022)   Received from [facility], [facility]   [facility] - Transportation    In the past 12 months, has lack of transportation kept you from medical appointments or from getting medications?: No    Lack of Transportation (Non-Medical): No  Physical Activity: Inactive (01/27/2019)   Exercise Vital Sign    Days of Exercise per Week: 0 days    Minutes of Exercise per Session: 0 min  Stress: Not on file  Social Connections: Unknown (01/27/2019)   Social Connection and Isolation Panel [NHANES]    Frequency of Communication with Friends and Family: Not on file    Frequency of Social Gatherings with Friends and Family: Not on file    Attends Religious Services: Never    Database administrator or Organizations: No     Attends Banker Meetings: Never    Marital Status: Married    Allergies:  Allergies  Allergen Reactions   Lisinopril Nausea Only and Swelling    Dehydration Tongue swelling Dehydration Tongue swelling    Peanut Oil Other (See Comments)    Admitted for anaphylaxis on 01/11/17 after reporting exposure to peanut butter per outside records. Admitted for anaphylaxis on 01/11/17 after reporting exposure to peanut butter per outside records. Admitted for anaphylaxis on 01/11/17 after reporting exposure to peanut butter per outside records.    Gabapentin Other (See Comments)    Bloating  Bloating   Nsaids Nausea And Vomiting   Tramadol Nausea And Vomiting   Trazodone And Nefazodone Diarrhea    And hallucinations    Penicillins Rash    .Has  patient had a PCN reaction causing immediate rash, facial/tongue/throat swelling, SOB or lightheadedness with hypotension: Unknown Has patient had a PCN reaction causing severe rash involving mucus membranes or skin necrosis: Unknown Has patient had a PCN reaction that required hospitalization: Unknown Has patient had a PCN reaction occurring within the last 10 years: Unknown If all of the above answers are "NO", then may proceed with Cephalosporin use.     Metabolic Disorder Labs: Lab Results  Component Value Date   HGBA1C 5.5 01/13/2017   MPG 111 01/13/2017   No results found for: "PROLACTIN" Lab Results  Component Value Date   CHOL 237 (H) 01/13/2017   TRIG 269 (H) 01/13/2017   HDL 52 01/13/2017   CHOLHDL 4.6 01/13/2017   VLDL 54 (H) 01/13/2017   LDLCALC 131 (H) 01/13/2017   Lab Results  Component Value Date   TSH 3.540 01/13/2017    Therapeutic Level Labs: No results found for: "LITHIUM" No results found for: "VALPROATE" No results found for: "CBMZ"  Current Medications: Current Outpatient Medications  Medication Sig Dispense Refill   acetaminophen (TYLENOL) 500 MG tablet Take 1,000 mg by mouth every 4 (four)  hours as needed for moderate pain or fever.     amLODipine (NORVASC) 5 MG tablet Take 5 mg by mouth daily.     ARIPiprazole (ABILIFY) 15 MG tablet Take 0.5 tablets (7.5 mg total) by mouth daily. 15 tablet 1   clopidogrel (PLAVIX) 75 MG tablet TAKE 1 TABLET BY MOUTH EVERY DAY 90 tablet 1   cyanocobalamin (VITAMIN B12) 1000 MCG/ML injection once.     dicyclomine (BENTYL) 20 MG tablet Take 20 mg by mouth as needed.     fluticasone (FLONASE) 50 MCG/ACT nasal spray Place 1 spray into both nostrils daily as needed for allergies.  1   loratadine (CLARITIN) 10 MG tablet Take 10 mg by mouth daily.     mirtazapine (REMERON) 15 MG tablet Take 1.5 tablets (22.5 mg total) by mouth at bedtime. 45 tablet 1   Multiple Vitamins-Minerals (MULTIVITAMIN WITH MINERALS) tablet Take 1 tablet by mouth daily.     omeprazole (PRILOSEC) 40 MG capsule Take 40 mg by mouth daily.     predniSONE (DELTASONE) 10 MG tablet Take 6 tablets  today, on day 2 take 5 tablets, day 3 take 4 tablets, day 4 take 3 tablets, day 5 take  2 tablets and 1 tablet the last day 21 tablet 0   propranolol (INDERAL) 10 MG tablet TAKE 1 TABLET (10 MG TOTAL) BY MOUTH 2 (TWO) TIMES DAILY AS NEEDED. FOR ANXIETY AS NEEDED 180 tablet 0   REPATHA SURECLICK 140 MG/ML SOAJ every 14 (fourteen) days.     rosuvastatin (CRESTOR) 10 MG tablet Take 10 mg by mouth 2 (two) times a week.     No current facility-administered medications for this visit.     Musculoskeletal: Strength & Muscle Tone: within normal limits Gait & Station: normal Patient leans: N/A  Psychiatric Specialty Exam: Review of Systems  Psychiatric/Behavioral:  Positive for sleep disturbance.        Mood swings    Blood pressure 134/80, pulse 76, temperature (!) 96.9 F (36.1 C), temperature source Skin, height 5\' 7"  (1.702 m), weight 154 lb 9.6 oz (70.1 kg).Body mass index is 24.21 kg/m.  General Appearance: Fairly Groomed  Eye Contact:  Fair  Speech:  Clear and Coherent  Volume:   Normal  Mood:   Mood swings  Affect:  Appropriate  Thought Process:  Goal Directed  and Descriptions of Associations: Intact  Orientation:  Full (Time, Place, and Person)  Thought Content: Logical   Suicidal Thoughts:  No  Homicidal Thoughts:  No  Memory:  Immediate;   Fair Recent;   Fair Remote;   Fair  Judgement:  Fair  Insight:  Fair  Psychomotor Activity:  Normal  Concentration:  Concentration: Fair and Attention Span: Fair  Recall:  [employer] of Knowledge: Fair  Language: Fair  Akathisia:  No  Handed:  Right  AIMS (if indicated): done  Assets:  Communication Skills Desire for Improvement Housing Intimacy Social Support Talents/Skills Transportation  ADL's:  Intact  Cognition: WNL  Sleep:  Poor   Screenings: Geneticist, molecular Office Visit from 05/23/2023 in [facility] Office Visit from 04/04/2023 in [facility] Admission (Discharged) from 01/12/2017 in [facility]  AIMS Total Score 0 0 0      AUDIT    Flowsheet Row Admission (Discharged) from 01/12/2017 in [facility] BE[facility]INE  Alcohol Use Disorder Identification Test Final Score (AUDIT) 0      GAD-7    Flowsheet Row Office Visit from 05/23/2023 in [facility] Office Visit from 04/04/2023 in [facility]  Total GAD-7 Score 14 21      PHQ2-9    Flowsheet Row Office Visit from 05/23/2023 in [facility] Office Visit from 04/04/2023 in [facility] Office Visit from 04/24/2019 in [facility] Visit from 11/11/2015 in [facility] Visit from 09/16/2015 in [facility] Total Score 0 2 0 0 0  PHQ-9 Total Score -- 6 -- -- --      Flowsheet Row Office Visit from 05/23/2023 in [facility] Office Visit from 04/04/2023 in [facility] ED from 09/15/2022 in [facility] Emergency Department at [facility]  C-SSRS RISK CATEGORY Moderate Risk No Risk No Risk        Assessment and Plan: Katrina Holmes is a 67 year old Caucasian female, married, currently on disability, lives in [place], has a history of bipolar disorder, PTSD, GAD was evaluated in office today.  Patient is currently struggling with mood swings as well as sleep issues, will benefit from the following plan.    Bipolar Disorder-unstable Reports mood swings and intermittent depressive symptoms. Currently on Abilify 5 mg and mirtazapine 22.5 mg. No reported side effects from Abilify. Discussed increasing Abilify dose to 7.5 mg to manage mood swings. Explained potential side effects such as muscle spasms, rigidity, stiffness, or tremors. - Increase Abilify dose to 7.5 mg (prescribe 15 mg tablets to be cut in half) - Monitor for side effects such as muscle spasms, rigidity, stiffness, or tremors - Reviewed and discussed EKG brought in, dated 04/11/2023-normal sinus rhythm, left atrial enlargement, QTc 408.  Generalized Anxiety Disorder-improving Reports ongoing anxiety symptoms. Currently managed with mirtazapine 22.5 mg and Abilify 5 mg. Discussed monitoring for changes in anxiety symptoms with increased Abilify dose. - Continue current medications - Monitor for changes in anxiety symptoms with increased Abilify dose  PTSD - improving  - Continue Abilify, dosage increased as noted above, mirtazapine 22.5 mg p.o. daily.  Insomnia-unstable Chronic difficulty staying asleep, waking up at 2-3 AM  nightly despite taking mirtazapine 22.5 mg at 8:30 PM. Reports feeling tired during  the day and taking naps four times a week for about an hour. Nocturnal snoring reported by partner, but no gasping, choking, or daytime somnolence.  Does report extreme fatigue during the day.  Prefers to avoid a sleep study at this time. Discussed risks and benefits of sleep hygiene measures and melatonin use. Advised to cut back on nap duration to 40 minutes and work on sleep hygiene, including limiting naps and staying active during the day. If sleep hygiene measures are ineffective after 4-8 weeks, consider melatonin or melatonin-magnesium combination. - Advise to cut back on nap duration to 40 minutes - Encourage sleep hygiene, including limiting naps and staying active during the day - Consider melatonin or melatonin-magnesium combination if sleep hygiene measures are ineffective after 4-8 weeks    Engages in regular physical activity, including daily walks. Reports no use of tobacco, alcohol, or cannabis. Takes B12 supplements. Reports good dental health. Discussed importance of continued physical activity and brain exercises. Recommended considering indoor walking options during cold weather, such as mall walking, or investing in a treadmill or stationary bike for home use. - Encourage continued physical activity, including walking and stretching - Recommend considering indoor walking options during cold weather, such as mall walking - Suggest investing in a treadmill or stationary bike for home use - Encourage brain exercises such as reading, puzzles, and social activities like Scrabble  Follow-up - Schedule follow-up appointment in four weeks.    Collaboration of Care: Collaboration of Care: Patient refused AEB patient declines referral for sleep study.  Patient/Guardian was advised Release of Information must be obtained prior to any record release in order to collaborate their care with an outside provider. Patient/Guardian was advised if they have not already done so to contact the  registration department to sign all necessary forms in order for [place] to release information regarding their care.   Consent: Patient/Guardian gives verbal consent for treatment and assignment of benefits for services provided during this visit. Patient/Guardian expressed understanding and agreed to proceed.   This note was generated in part or whole with voice recognition software. Voice recognition is usually quite accurate but there are transcription errors that can and very often do occur. I apologize for any typographical errors that were not detected and corrected.     Jomarie Longs, MD 05/25/2023, 7:19 AM

## 2023-05-25 MED ORDER — MIRTAZAPINE 15 MG PO TABS: 22.5000 mg | ORAL_TABLET | Freq: Every day | ORAL | 1 refills | Status: DC

## 2023-05-28 ENCOUNTER — Other Ambulatory Visit (INDEPENDENT_AMBULATORY_CARE_PROVIDER_SITE_OTHER): Payer: Self-pay | Admitting: Nurse Practitioner

## 2023-06-17 ENCOUNTER — Other Ambulatory Visit: Payer: Self-pay | Admitting: Psychiatry

## 2023-06-17 DIAGNOSIS — F4310 Post-traumatic stress disorder, unspecified: Secondary | ICD-10-CM

## 2023-06-17 DIAGNOSIS — F411 Generalized anxiety disorder: Secondary | ICD-10-CM

## 2023-07-16 ENCOUNTER — Ambulatory Visit: Payer: PPO | Admitting: Psychiatry

## 2023-07-21 ENCOUNTER — Other Ambulatory Visit: Payer: Self-pay | Admitting: Psychiatry

## 2023-07-21 DIAGNOSIS — F411 Generalized anxiety disorder: Secondary | ICD-10-CM

## 2023-08-02 ENCOUNTER — Encounter: Payer: Self-pay | Admitting: Psychiatry

## 2023-08-02 ENCOUNTER — Ambulatory Visit (INDEPENDENT_AMBULATORY_CARE_PROVIDER_SITE_OTHER): Payer: No Typology Code available for payment source | Admitting: Psychiatry

## 2023-08-02 VITALS — BP 158/82 | HR 92 | Temp 97.3°F | Ht 67.0 in | Wt 156.8 lb

## 2023-08-02 DIAGNOSIS — F1021 Alcohol dependence, in remission: Secondary | ICD-10-CM | POA: Diagnosis not present

## 2023-08-02 DIAGNOSIS — F3178 Bipolar disorder, in full remission, most recent episode mixed: Secondary | ICD-10-CM

## 2023-08-02 DIAGNOSIS — F4310 Post-traumatic stress disorder, unspecified: Secondary | ICD-10-CM | POA: Diagnosis not present

## 2023-08-02 DIAGNOSIS — F1221 Cannabis dependence, in remission: Secondary | ICD-10-CM | POA: Diagnosis not present

## 2023-08-02 DIAGNOSIS — F411 Generalized anxiety disorder: Secondary | ICD-10-CM | POA: Diagnosis not present

## 2023-08-02 NOTE — Progress Notes (Unsigned)
BH MD OP Progress Note  08/02/2023 2:18 PM Katrina Holmes  MRN:  [id number]  Chief Complaint:  Chief Complaint  Patient presents with   Follow-up   Depression   Anxiety   Medication Refill   HPI: Katrina Holmes is a 68 year old Caucasian female, married, lives in [place], has a history of bipolar disorder, PTSD, GAD, alcohol and cannabis use disorder in remission presented for follow-up appointment.  Patient is currently compliant on the higher dosage of Abilify increased to 7.5 mg on 05/23/2023.  She is also compliant on the mirtazapine.  Patient reports since increased dosage of Abilify she has noticed an improvement in her mood.  She denies any significant depression symptoms or mood swings at this time.  Does have anxiety although she is managing it better than before.  She denies any side effects to Abilify.  She however reports since being on the mirtazapine she has noticed an increase in weight gain may have gained 30 pounds in the past several months altogether.  She initially thought it may have been due to her smoking cessation she had stopped smoking 6 months ago.  She reports she however continues to be not able to lose weight and that has been concerning to her.  She wonders if she should get off of the mirtazapine.  She however is worried about getting off of it since it helps with sleep.  She is currently exercising, staying active, going for walks.  She is managing her diet currently on a low-calorie weight.  She drinks plenty of fluids.  Denies any suicidality, homicidality or perceptual disturbances.  Reports good support system from her spouse.  Patient otherwise denies any concerns.  Visit Diagnosis:    ICD-10-CM   1. Bipolar disorder, in full remission, most recent episode mixed (HCC)  F31.78     2. PTSD (post-traumatic stress disorder)  F43.10     3. GAD (generalized anxiety disorder)  F41.1     4. Alcohol use disorder, moderate, in sustained remission  (HCC)  F10.21     5. Cannabis use disorder, severe, in sustained remission (HCC)  F12.21       Past Psychiatric History: I have reviewed past psychiatric history from progress note on 04/04/2023.  Past trials of medications like BuSpar-side effects, Prozac-tardive dyskinesia, Zyprexa, clonazepam, Celexa-GI side effects, Lexapro-GI side effects, lithium-side effects.  Past Medical History:  Past Medical History:  Diagnosis Date   Allergy    Anxiety    Degenerative disc disease, lumbar    Depression    Hyperlipidemia    Hypertension    IBS (irritable bowel syndrome) 15 years   PTSD (post-traumatic stress disorder)    Sciatica    Sciatica    Suicide attempt [facility])     Past Surgical History:  Procedure Laterality Date   ABDOMINAL HYSTERECTOMY     CAROTID PTA/STENT INTERVENTION Left 12/01/2020   Procedure: CAROTID PTA/STENT INTERVENTION;  Surgeon: Renford Dills, MD;  Location: [facility] INVASIVE CV LAB;  Service: Cardiovascular;  Laterality: Left;   MANDIBLE SURGERY     NECK SURGERY  2006   SHOULDER ARTHROSCOPY WITH ROTATOR CUFF REPAIR Left    3 surgeries (2006-2007-2008)    Family Psychiatric History: I have reviewed family psychiatric history from progress note on 04/04/2023.  Family History:  Family History  Problem Relation Age of Onset   Drug abuse Mother    Heart disease Mother    Mental illness Mother    Stroke Father  Alcohol abuse Father    Alcohol abuse Sister    Alcohol abuse Sister    Alcohol abuse Sister    Alcohol abuse Brother     Social History: I have reviewed social history from progress note on 04/04/2023. Social History   Socioeconomic History   Marital status: Married    Spouse name: robert   Number of children: 4   Years of education: Not on file   Highest education level: Not on file  Occupational History    Comment: Disability  Tobacco Use   Smoking status: Former    Current packs/day: 1.00    Types: Cigarettes   Smokeless tobacco:  Never  Vaping Use   Vaping status: Never Used  Substance and Sexual Activity   Alcohol use: No   Drug use: No   Sexual activity: Not on file  Other Topics Concern   Not on file  Social History Narrative   Not on file   Social Drivers of Health   Financial Resource Strain: Low Risk  (12/20/2022)   Received from [facility], [facility]   Overall Financial Resource Strain (CARDIA)    Difficulty of Paying Living Expenses: Not hard at all  Food Insecurity: No Food Insecurity (12/20/2022)   Received from [facility], [facility]   Hunger Vital Sign    Worried About Running Out of Food in the Last Year: Never true    Ran Out of Food in the Last Year: Never true  Transportation Needs: No Transportation Needs (12/20/2022)   Received from [facility], [facility]   [facility] - Transportation    In the past 12 months, has lack of transportation kept you from medical appointments or from getting medications?: No    Lack of Transportation (Non-Medical): No  Physical Activity: Inactive (01/27/2019)   Exercise Vital Sign    Days of Exercise per Week: 0 days    Minutes of Exercise per Session: 0 min  Stress: Not on file  Social Connections: Unknown (01/27/2019)   Social Connection and Isolation Panel [NHANES]    Frequency of Communication with Friends and Family: Not on file    Frequency of Social Gatherings with Friends and Family: Not on file    Attends Religious Services: Never    Database administrator or Organizations: No    Attends Banker Meetings: Never    Marital Status: Married    Allergies:  Allergies  Allergen Reactions   Lisinopril Nausea Only and Swelling    Dehydration Tongue swelling Dehydration Tongue swelling    Peanut Oil Other (See Comments)    Admitted for anaphylaxis on 01/11/17 after reporting exposure to peanut butter per outside  records. Admitted for anaphylaxis on 01/11/17 after reporting exposure to peanut butter per outside records. Admitted for anaphylaxis on 01/11/17 after reporting exposure to peanut butter per outside records.    Gabapentin Other (See Comments)    Bloating  Bloating   Nsaids Nausea And Vomiting   Tramadol Nausea And Vomiting   Trazodone And Nefazodone Diarrhea    And hallucinations    Penicillins Rash    .Has patient had a PCN reaction causing immediate rash, facial/tongue/throat swelling, SOB or lightheadedness with hypotension: Unknown Has patient had a PCN reaction causing severe rash involving mucus membranes or skin necrosis: Unknown Has patient had a PCN reaction that required hospitalization: Unknown Has patient had a PCN reaction occurring within the last 10  years: Unknown If all of the above answers are "NO", then may proceed with Cephalosporin use.     Metabolic Disorder Labs: Lab Results  Component Value Date   HGBA1C 5.5 01/13/2017   MPG 111 01/13/2017   No results found for: "PROLACTIN" Lab Results  Component Value Date   CHOL 237 (H) 01/13/2017   TRIG 269 (H) 01/13/2017   HDL 52 01/13/2017   CHOLHDL 4.6 01/13/2017   VLDL 54 (H) 01/13/2017   LDLCALC 131 (H) 01/13/2017   Lab Results  Component Value Date   TSH 3.540 01/13/2017    Therapeutic Level Labs: No results found for: "LITHIUM" No results found for: "VALPROATE" No results found for: "CBMZ"  Current Medications: Current Outpatient Medications  Medication Sig Dispense Refill   acetaminophen (TYLENOL) 500 MG tablet Take 1,000 mg by mouth every 4 (four) hours as needed for moderate pain or fever.     amLODipine (NORVASC) 5 MG tablet Take 5 mg by mouth daily.     ARIPiprazole (ABILIFY) 15 MG tablet TAKE 0.5 TABLET BY MOUTH DAILY. 45 tablet 1   clopidogrel (PLAVIX) 75 MG tablet TAKE 1 TABLET BY MOUTH EVERY DAY 90 tablet 1   cyanocobalamin (VITAMIN B12) 1000 MCG/ML injection once.     dicyclomine  (BENTYL) 20 MG tablet Take 20 mg by mouth as needed.     fluticasone (FLONASE) 50 MCG/ACT nasal spray Place 1 spray into both nostrils daily as needed for allergies.  1   loratadine (CLARITIN) 10 MG tablet Take 10 mg by mouth daily.     mirtazapine (REMERON) 15 MG tablet TAKE 1.5 TABLETS (22.5 MG TOTAL) BY MOUTH AT BEDTIME. 135 tablet 1   Multiple Vitamins-Minerals (MULTIVITAMIN WITH MINERALS) tablet Take 1 tablet by mouth daily.     omeprazole (PRILOSEC) 40 MG capsule Take 40 mg by mouth daily.     predniSONE (DELTASONE) 10 MG tablet Take 6 tablets  today, on day 2 take 5 tablets, day 3 take 4 tablets, day 4 take 3 tablets, day 5 take  2 tablets and 1 tablet the last day 21 tablet 0   propranolol (INDERAL) 10 MG tablet TAKE 1 TABLET (10 MG TOTAL) BY MOUTH 2 (TWO) TIMES DAILY AS NEEDED. FOR ANXIETY AS NEEDED 180 tablet 0   REPATHA SURECLICK 140 MG/ML SOAJ every 14 (fourteen) days.     rosuvastatin (CRESTOR) 10 MG tablet Take 10 mg by mouth 2 (two) times a week.     No current facility-administered medications for this visit.     Musculoskeletal: Strength & Muscle Tone: within normal limits Gait & Station: normal Patient leans: N/A  Psychiatric Specialty Exam: Review of Systems  Psychiatric/Behavioral:  The patient is nervous/anxious.     Blood pressure (!) 158/82, pulse 92, temperature (!) 97.3 F (36.3 C), temperature source Skin, height 5\' 7"  (1.702 m), weight 156 lb 12.8 oz (71.1 kg).Body mass index is 24.56 kg/m.  General Appearance: Casual  Eye Contact:  Fair  Speech:  Clear and Coherent  Volume:  Normal  Mood:  Anxious  Affect:  Appropriate  Thought Process:  Goal Directed and Descriptions of Associations: Intact  Orientation:  Full (Time, Place, and Person)  Thought Content: Logical   Suicidal Thoughts:  No  Homicidal Thoughts:  No  Memory:  Immediate;   Fair Recent;   Fair Remote;   Fair  Judgement:  Fair  Insight:  Fair  Psychomotor Activity:  Normal   Concentration:  Concentration: Fair and Attention Span: Fair  Recall:  Katrina Holmes of Knowledge: Fair  Language: Fair  Akathisia:  No  Handed:  Right  AIMS (if indicated): done  Assets:  Communication Skills Desire for Improvement Housing Intimacy Social Support  ADL's:  Intact  Cognition: WNL  Sleep:  Fair   Screenings: AIMS    Flowsheet Row Office Visit from 08/02/2023 in [facility] Office Visit from 05/23/2023 in [facility] Office Visit from 04/04/2023 [facility] Admission (Discharged) from 01/12/2017 in [facility]  AIMS Total Score 0 0 0 0      AUDIT    Flowsheet Row Admission (Discharged) from 01/12/2017 in [facility]  Alcohol Use Disorder Identification Test Final Score (AUDIT) 0      GAD-7    Flowsheet Row Office Visit from 08/02/2023 in [facility] Office Visit from 05/23/2023 in [facility] Office Visit from 04/04/2023 in [facility]  Total GAD-7 Score 6 14 21       PHQ2-9    Flowsheet Row Office Visit from 08/02/2023 in [facility] Office Visit from 05/23/2023 in [facility] Office Visit from 04/04/2023 in [facility] Office Visit from 04/24/2019 in [facility] Visit from 11/11/2015 in [facility] Total Score 0 0 2 0 0  PHQ-9 Total Score -- -- 6 -- --      Flowsheet Row Office Visit from 08/02/2023 in [facility] Office Visit from 05/23/2023 in [facility] Office Visit from 04/04/2023 in [facility]  C-SSRS RISK CATEGORY No Risk Moderate Risk No Risk        Assessment and Plan: Katrina Holmes is a 68 year old Caucasian female, married, currently on disability, lives in [place], has a history of bipolar disorder, PTSD, GAD was evaluated in office today.  Patient is currently improving although concerns about weight gain side effects of mirtazapine, discussed assessment and plan as noted below.  Bipolar or in remission Patient currently reports mood symptoms as stable on the current medication regimen. - Continue Abilify 7.5 mg daily - Monitor for weight gain side effects.  Patient advised to stay active exercise and watch diet. - AIMS - 0  PTSD-improving Currently reports anxiety and mood symptoms as improving. - Continue Mirtazapine 22.5 mg at bedtime. - Will consider changing Mirtazapine to another psychotropic medication if she continues to have weight gain side effects.Currently BMI within goal. - Continue Propranolol 10 mg twice a day as needed.  Insomnia-stable Currently reports sleep as improved on the current medication regimen.  History of nocturnal snoring reported by partner.  Does report history of fatigue although currently improving. - Continue Mirtazapine 22.5 mg at bedtime - Continue sleep hygiene techniques. - Patient declined referral for sleep study.  Patient with elevated blood pressure reading in session today reports she is in pain of her back which is likely contributing to elevated blood pressure.  She declines repeating blood pressure today in clinic, would like to monitor it at home and agrees to reach out to primary care provider if needed.  Follow-up in clinic in 3 months or sooner if needed.   Consent: Patient/Guardian gives verbal consent for treatment  and assignment of benefits for services provided during this visit. Patient/Guardian  expressed understanding and agreed to proceed.   This note was generated in part or whole with voice recognition software. Voice recognition is usually quite accurate but there are transcription errors that can and very often do occur. I apologize for any typographical errors that were not detected and corrected.    Jomarie Longs, MD 08/03/2023, 1:30 PM

## 2023-08-09 ENCOUNTER — Ambulatory Visit: Payer: Self-pay | Admitting: Psychiatry

## 2023-08-15 DIAGNOSIS — E785 Hyperlipidemia, unspecified: Secondary | ICD-10-CM | POA: Diagnosis not present

## 2023-08-22 DIAGNOSIS — I10 Essential (primary) hypertension: Secondary | ICD-10-CM | POA: Diagnosis not present

## 2023-08-22 DIAGNOSIS — F319 Bipolar disorder, unspecified: Secondary | ICD-10-CM | POA: Diagnosis not present

## 2023-08-22 DIAGNOSIS — I6522 Occlusion and stenosis of left carotid artery: Secondary | ICD-10-CM | POA: Diagnosis not present

## 2023-08-22 DIAGNOSIS — M5442 Lumbago with sciatica, left side: Secondary | ICD-10-CM | POA: Diagnosis not present

## 2023-08-22 DIAGNOSIS — R7303 Prediabetes: Secondary | ICD-10-CM | POA: Diagnosis not present

## 2023-08-22 DIAGNOSIS — E785 Hyperlipidemia, unspecified: Secondary | ICD-10-CM | POA: Diagnosis not present

## 2023-08-22 DIAGNOSIS — G8929 Other chronic pain: Secondary | ICD-10-CM | POA: Diagnosis not present

## 2023-08-22 DIAGNOSIS — K219 Gastro-esophageal reflux disease without esophagitis: Secondary | ICD-10-CM | POA: Diagnosis not present

## 2023-08-22 DIAGNOSIS — I700 Atherosclerosis of aorta: Secondary | ICD-10-CM | POA: Diagnosis not present

## 2023-08-23 DIAGNOSIS — H524 Presbyopia: Secondary | ICD-10-CM | POA: Diagnosis not present

## 2023-08-23 DIAGNOSIS — H2513 Age-related nuclear cataract, bilateral: Secondary | ICD-10-CM | POA: Diagnosis not present

## 2023-08-23 DIAGNOSIS — H04123 Dry eye syndrome of bilateral lacrimal glands: Secondary | ICD-10-CM | POA: Diagnosis not present

## 2023-10-12 ENCOUNTER — Telehealth: Payer: Self-pay | Admitting: Psychiatry

## 2023-10-12 DIAGNOSIS — F411 Generalized anxiety disorder: Secondary | ICD-10-CM

## 2023-10-12 MED ORDER — HYDROXYZINE HCL 25 MG PO TABS: 25.0000 mg | ORAL_TABLET | Freq: Three times a day (TID) | ORAL | 0 refills | Status: DC | PRN

## 2023-10-12 NOTE — Telephone Encounter (Signed)
 Contacted patient to discuss her concern about medication.  She reports she stopped taking the Abilify  a month ago since she was having muscle rigidity and tremors.  She is currently extremely anxious and would like to start a medication to help with the same.  Discussed starting hydroxyzine  25 mg 3 times a day as needed.  Patient is scheduled to see this provider in 2 weeks from now.  Will have staff place this patient on a priority list to call if we have any no shows or cancellations in the near future to make further medication changes.

## 2023-10-12 NOTE — Telephone Encounter (Signed)
 pt left a message that she "stopped taking her anxiety medication on 3-22 because her muscles were getting stiff and now she is having high anxiety and she wants something else."

## 2023-10-23 ENCOUNTER — Other Ambulatory Visit: Payer: Self-pay | Admitting: Psychiatry

## 2023-10-23 DIAGNOSIS — F411 Generalized anxiety disorder: Secondary | ICD-10-CM

## 2023-10-31 ENCOUNTER — Ambulatory Visit (INDEPENDENT_AMBULATORY_CARE_PROVIDER_SITE_OTHER): Payer: No Typology Code available for payment source | Admitting: Psychiatry

## 2023-10-31 ENCOUNTER — Other Ambulatory Visit: Payer: Self-pay

## 2023-10-31 ENCOUNTER — Encounter: Payer: Self-pay | Admitting: Psychiatry

## 2023-10-31 VITALS — BP 143/80 | HR 85 | Temp 97.1°F | Ht 67.0 in | Wt 159.8 lb

## 2023-10-31 DIAGNOSIS — F1021 Alcohol dependence, in remission: Secondary | ICD-10-CM | POA: Diagnosis not present

## 2023-10-31 DIAGNOSIS — F3162 Bipolar disorder, current episode mixed, moderate: Secondary | ICD-10-CM

## 2023-10-31 DIAGNOSIS — F4310 Post-traumatic stress disorder, unspecified: Secondary | ICD-10-CM

## 2023-10-31 DIAGNOSIS — F411 Generalized anxiety disorder: Secondary | ICD-10-CM | POA: Diagnosis not present

## 2023-10-31 DIAGNOSIS — F1221 Cannabis dependence, in remission: Secondary | ICD-10-CM | POA: Diagnosis not present

## 2023-10-31 MED ORDER — MIRTAZAPINE 15 MG PO TABS: 22.5000 mg | ORAL_TABLET | Freq: Every day | ORAL | 1 refills | Status: AC

## 2023-10-31 MED ORDER — LAMOTRIGINE 25 MG PO TABS: ORAL_TABLET | ORAL | 0 refills | Status: AC

## 2023-10-31 NOTE — Patient Instructions (Signed)
 Lamotrigine Tablets What is this medication? LAMOTRIGINE (la MOE Patrecia Pace) prevents and controls seizures in people with epilepsy. It may also be used to treat bipolar disorder. It works by calming overactive nerves in your body. This medicine may be used for other purposes; ask your health care provider or pharmacist if you have questions. COMMON BRAND NAME(S): Lamictal, Subvenite What should I tell my care team before I take this medication? They need to know if you have any of these conditions: Heart disease History of irregular heartbeat Immune system problems Kidney disease Liver disease Low levels of folic acid in the blood Lupus Mental health condition Suicidal thoughts, plans, or attempt by you or a family member An unusual or allergic reaction to lamotrigine, other medications, foods, dyes, or preservatives Pregnant or trying to get pregnant Breastfeeding How should I use this medication? Take this medication by mouth with a glass of water. Follow the directions on the prescription label. Do not chew these tablets. If this medication upsets your stomach, take it with food or milk. Take your doses at regular intervals. Do not take your medication more often than directed. A special MedGuide will be given to you by the pharmacist with each new prescription and refill. Be sure to read this information carefully each time. Talk to your care team about the use of this medication in children. While this medication may be prescribed for children as young as 2 years for selected conditions, precautions do apply. Overdosage: If you think you have taken too much of this medicine contact a poison control center or emergency room at once. NOTE: This medicine is only for you. Do not share this medicine with others. What if I miss a dose? If you miss a dose, take it as soon as you can. If it is almost time for your next dose, take only that dose. Do not take double or extra doses. What may  interact with this medication? Atazanavir Certain medications for irregular heartbeat Certain medications for seizures, such as carbamazepine, phenobarbital, phenytoin, primidone, or valproic acid Estrogen or progestin hormones Lopinavir Rifampin Ritonavir This list may not describe all possible interactions. Give your health care provider a list of all the medicines, herbs, non-prescription drugs, or dietary supplements you use. Also tell them if you smoke, drink alcohol, or use illegal drugs. Some items may interact with your medicine. What should I watch for while using this medication? Visit your care team for regular checks on your progress. If you take this medication for seizures, wear a Medic Alert bracelet or necklace. Carry an identification card with information about your condition, medications, and care team. It is important to take this medication exactly as directed. When first starting treatment, your dose will need to be adjusted slowly. It may take weeks or months before your dose is stable. You should contact your care team if your seizures get worse or if you have any new types of seizures. Do not stop taking this medication unless instructed by your care team. Stopping your medication suddenly can increase your seizures or their severity. This medication may cause serious skin reactions. They can happen weeks to months after starting the medication. Contact your care team right away if you notice fevers or flu-like symptoms with a rash. The rash may be red or purple and then turn into blisters or peeling of the skin. You may also notice a red rash with swelling of the face, lips, or lymph nodes in your neck or under your  arms. This medication may affect your coordination, reaction time, or judgment. Do not drive or operate machinery until you know how this medication affects you. Sit up or stand slowly to reduce the risk of dizzy or fainting spells. Drinking alcohol with this  medication can increase the risk of these side effects. If you are taking this medication for bipolar disorder, it is important to report any changes in your mood to your care team. If your condition gets worse, you get mentally depressed, feel very hyperactive or manic, have difficulty sleeping, or have thoughts of hurting yourself or committing suicide, you need to get help from your care team right away. If you are a caregiver for someone taking this medication for bipolar disorder, you should also report these behavioral changes right away. The use of this medication may increase the chance of suicidal thoughts or actions. Pay special attention to how you are responding while on this medication. Your mouth may get dry. Chewing sugarless gum or sucking hard candy and drinking plenty of water may help. Contact your care team if the problem does not go away or is severe. If you become pregnant while using this medication, you may enroll in the [place] American Antiepileptic Drug Pregnancy Registry by calling [phone]. This registry collects information about the safety of antiepileptic medication use during pregnancy. This medication may cause a decrease in folic acid. You should make sure that you get enough folic acid while you are taking this medication. Discuss the foods you eat and the vitamins you take with your care team. What side effects may I notice from receiving this medication? Side effects that you should report to your care team as soon as possible: Allergic reactions--skin rash, itching, hives, swelling of the face, lips, tongue, or throat Change in vision Fever, neck pain or stiffness, sensitivity to light, headache, nausea, vomiting, confusion, which may be signs of meningitis Fever, rash, swollen lymph nodes, confusion, trouble walking, loss of balance or coordination, seizures Heart rhythm changes--fast or irregular heartbeat, dizziness, feeling faint or lightheaded, chest pain,  trouble breathing Infection--fever, chills, cough, or sore throat Low red blood cell level--unusual weakness or fatigue, dizziness, headache, trouble breathing Rash, fever, and swollen lymph nodes Redness, blistering, peeling, or loosening of the skin, including inside the mouth Thoughts of suicide or self-harm, worsening mood, feelings of depression Unusual bruising or bleeding Side effects that usually do not require medical attention (report these to your care team if they continue or are bothersome): Diarrhea Dizziness Drowsiness Headache Nausea Stomach pain Tremors or shaking This list may not describe all possible side effects. Call your doctor for medical advice about side effects. You may report side effects to FDA at 1-800-FDA-1088. Where should I keep my medication? Keep out of the reach of children and pets. Store at [employer] [address] (77 degrees F). Protect from light. Get rid of any unused medication after the expiration date. To get rid of medications that are no longer needed or have expired: Take the medication to a medication take-back program. Check with your pharmacy or law enforcement to find a location. If you cannot return the medication, check the label or package insert to see if the medication should be thrown out in the garbage or flushed down the toilet. If you are not sure, ask your care team. If it is safe to put it in the trash, empty the medication out of the container. Mix the medication with cat litter, dirt, coffee grounds, or other unwanted substance.  Seal the mixture in a bag or container. Put it in the trash. NOTE: This sheet is a summary. It may not cover all possible information. If you have questions about this medicine, talk to your doctor, pharmacist, or health care provider.  2024 Elsevier/Gold Standard (2023-05-18 00:00:00)

## 2023-10-31 NOTE — Progress Notes (Signed)
 [facility] MD OP Progress Note  10/31/2023 5:08 PM Katrina Holmes  MRN:  [id number]  Chief Complaint:  Chief Complaint  Patient presents with   Follow-up   Depression   Anxiety   Medication Refill   Discussed the use of AI scribe software for clinical note transcription with the patient, who gave verbal consent to proceed.  History of Present Illness Katrina Holmes is a 68 year old Caucasian female, married, lives in [place], has a history of bipolar disorder, PTSD, GAD, alcohol and cannabis use disorder in remission was evaluated in office today, presents with increased anxiety after discontinuing Abilify .  She discontinued Abilify  one month ago due to muscle rigidity and tremors. Since stopping the medication, she has experienced increased anxiety, describing herself as 'full of anxiety' and has had three panic attacks in one month. She attributes these episodes to living with a 'very negative, judgmental, man,' which she finds triggering.  She describes irritability, mood swings since the past few weeks.  Previously, she was on Abilify  7.5 mg, which helped manage her anxiety, but she stopped it due to side effects. Currently, she is not on any medication for bipolar disorder but is taking mirtazapine  22.5 mg for anxiety and sleep. She also has hydroxyzine  and propranolol  available for anxiety symptoms.  She does use it as needed and that has been beneficial.  Sleep is overall good except for last night due to her dog interrupting her sleep.  In terms of social history, she has been married for 35 years and describes her husband as having an 'explosive personality,' which contributes to her stress. She manages her stress by walking away from triggering situations and has a support system from a friend she can call when needed.  She denies any suicidality, homicidality or perceptual disturbances.  Denies any other concerns today.    Visit Diagnosis:    ICD-10-CM   1. Bipolar 1  disorder, mixed, moderate (HCC)  F31.62 lamoTRIgine (LAMICTAL) 25 MG tablet    2. PTSD (post-traumatic stress disorder)  F43.10 mirtazapine  (REMERON ) 15 MG tablet    3. GAD (generalized anxiety disorder)  F41.1 mirtazapine  (REMERON ) 15 MG tablet    4. Alcohol use disorder, moderate, in sustained remission (HCC)  F10.21     5. Cannabis use disorder, severe, in sustained remission (HCC)  F12.21       Past Psychiatric History: I have reviewed past psychiatric history from progress note on 04/04/2023.  Past trials of medications like BuSpar-side effect, Prozac -tardive dyskinesia, Zyprexa , clonazepam , Celexa -GI side effects, Lexapro -GI side effects, lithium-side effects.  Past Medical History:  Past Medical History:  Diagnosis Date   Allergy    Anxiety    Degenerative disc disease, lumbar    Depression    Hyperlipidemia    Hypertension    IBS (irritable bowel syndrome) 15 years   PTSD (post-traumatic stress disorder)    Sciatica    Sciatica    Suicide attempt [facility])     Past Surgical History:  Procedure Laterality Date   ABDOMINAL HYSTERECTOMY     CAROTID PTA/STENT INTERVENTION Left 12/01/2020   Procedure: CAROTID PTA/STENT INTERVENTION;  Surgeon: Jackquelyn Mass, MD;  Location: [facility] INVASIVE CV LAB;  Service: Cardiovascular;  Laterality: Left;   MANDIBLE SURGERY     NECK SURGERY  2006   SHOULDER ARTHROSCOPY WITH ROTATOR CUFF REPAIR Left    3 surgeries (2006-2007-2008)    Family Psychiatric History: I have reviewed family psychiatric history from progress note on 04/04/2023.  Family  History:  Family History  Problem Relation Age of Onset   Drug abuse Mother    Heart disease Mother    Mental illness Mother    Stroke Father    Alcohol abuse Father    Alcohol abuse Sister    Alcohol abuse Sister    Alcohol abuse Sister    Alcohol abuse Brother     Social History: I have reviewed social history from progress note on 04/04/2023. Social History   Socioeconomic History    Marital status: Married    Spouse name: robert   Number of children: 4   Years of education: Not on file   Highest education level: Not on file  Occupational History    Comment: Disability  Tobacco Use   Smoking status: Former    Current packs/day: 1.00    Types: Cigarettes   Smokeless tobacco: Never  Vaping Use   Vaping status: Never Used  Substance and Sexual Activity   Alcohol use: No   Drug use: No   Sexual activity: Not on file  Other Topics Concern   Not on file  Social History Narrative   Not on file   Social Drivers of Health   Financial Resource Strain: Low Risk  (12/20/2022)   Received from [facility], [facility]   Overall Financial Resource Strain (CARDIA)    Difficulty of Paying Living Expenses: Not hard at all  Food Insecurity: No Food Insecurity (12/20/2022)   Received [facility], [facility]   Hunger Vital Sign    Worried About Running Out of Food in the Last Year: Never true    Ran Out of Food in the Last Year: Never true  Transportation Needs: No Transportation Needs (12/20/2022)   Received from [facility], [facility]   [facility] - Transportation    In the past 12 months, has lack of transportation kept you from medical appointments or from getting medications?: No    Lack of Transportation (Non-Medical): No  Physical Activity: Inactive (01/27/2019)   Exercise Vital Sign    Days of Exercise per Week: 0 days    Minutes of Exercise per Session: 0 min  Stress: Not on file  Social Connections: Unknown (01/27/2019)   Social Connection and Isolation Panel [NHANES]    Frequency of Communication with Friends and Family: Not on file    Frequency of Social Gatherings with Friends and Family: Not on file    Attends Religious Services: Never    Database administrator or Organizations: No    Attends Banker Meetings: Never    Marital  Status: Married    Allergies:  Allergies  Allergen Reactions   Lisinopril Nausea Only and Swelling    Dehydration Tongue swelling Dehydration Tongue swelling    Peanut Oil Other (See Comments)    Admitted for anaphylaxis on 01/11/17 after reporting exposure to peanut butter per outside records. Admitted for anaphylaxis on 01/11/17 after reporting exposure to peanut butter per outside records. Admitted for anaphylaxis on 01/11/17 after reporting exposure to peanut butter per outside records.    Gabapentin Other (See Comments)    Bloating  Bloating   Nsaids Nausea And Vomiting   Tramadol  Nausea And Vomiting   Trazodone  And Nefazodone Diarrhea    And hallucinations    Penicillins Rash    .Has patient had a PCN reaction causing immediate rash, facial/tongue/throat swelling, SOB or lightheadedness with hypotension: Unknown Has patient  had a PCN reaction causing severe rash involving mucus membranes or skin necrosis: Unknown Has patient had a PCN reaction that required hospitalization: Unknown Has patient had a PCN reaction occurring within the last 10 years: Unknown If all of the above answers are "NO", then may proceed with Cephalosporin use.     Metabolic Disorder Labs: Lab Results  Component Value Date   HGBA1C 5.5 01/13/2017   MPG 111 01/13/2017   No results found for: "PROLACTIN" Lab Results  Component Value Date   CHOL 237 (H) 01/13/2017   TRIG 269 (H) 01/13/2017   HDL 52 01/13/2017   CHOLHDL 4.6 01/13/2017   VLDL 54 (H) 01/13/2017   LDLCALC 131 (H) 01/13/2017   Lab Results  Component Value Date   TSH 3.540 01/13/2017    Therapeutic Level Labs: No results found for: "LITHIUM" No results found for: "VALPROATE" No results found for: "CBMZ"  Current Medications: Current Outpatient Medications  Medication Sig Dispense Refill   acetaminophen  (TYLENOL ) 500 MG tablet Take 1,000 mg by mouth every 4 (four) hours as needed for moderate pain or fever.     amLODipine   (NORVASC ) 5 MG tablet Take 5 mg by mouth daily.     clopidogrel  (PLAVIX ) 75 MG tablet TAKE 1 TABLET BY MOUTH EVERY DAY 90 tablet 1   cyanocobalamin  (VITAMIN B12) 1000 MCG/ML injection once.     dicyclomine  (BENTYL ) 20 MG tablet Take 20 mg by mouth as needed.     fluticasone  (FLONASE ) 50 MCG/ACT nasal spray Place 1 spray into both nostrils daily as needed for allergies.  1   hydrOXYzine  (ATARAX ) 25 MG tablet Take 1 tablet (25 mg total) by mouth 3 (three) times daily as needed for anxiety. 90 tablet 0   lamoTRIgine (LAMICTAL) 25 MG tablet Take 1 tablet (25 mg total) by mouth daily for 15 days, THEN 1 tablet (25 mg total) 2 (two) times daily for 15 days. 45 tablet 0   loratadine  (CLARITIN ) 10 MG tablet Take 10 mg by mouth daily.     Multiple Vitamins-Minerals (MULTIVITAMIN WITH MINERALS) tablet Take 1 tablet by mouth daily.     omeprazole (PRILOSEC) 40 MG capsule Take 40 mg by mouth daily.     predniSONE  (DELTASONE ) 10 MG tablet Take 6 tablets  today, on day 2 take 5 tablets, day 3 take 4 tablets, day 4 take 3 tablets, day 5 take  2 tablets and 1 tablet the last day 21 tablet 0   propranolol  (INDERAL ) 10 MG tablet TAKE 1 TABLET (10 MG TOTAL) BY MOUTH 2 (TWO) TIMES DAILY AS NEEDED. FOR ANXIETY AS NEEDED 180 tablet 0   REPATHA SURECLICK 140 MG/ML SOAJ every 14 (fourteen) days.     rosuvastatin (CRESTOR) 10 MG tablet Take 10 mg by mouth 2 (two) times a week.     mirtazapine  (REMERON ) 15 MG tablet Take 1.5 tablets (22.5 mg total) by mouth at bedtime. 135 tablet 1   No current facility-administered medications for this visit.     Musculoskeletal: Strength & Muscle Tone: within normal limits Gait & Station: normal Patient leans: N/A  Psychiatric Specialty Exam: Review of Systems  Psychiatric/Behavioral:  Positive for sleep disturbance. The patient is nervous/anxious.        Mood lability    Blood pressure (!) 143/80, pulse 85, temperature (!) 97.1 F (36.2 C), temperature source Temporal,  height 5\' 7"  (1.702 m), weight 159 lb 12.8 oz (72.5 kg).Body mass index is 25.03 kg/m.  General Appearance: Casual  Eye Contact:  Fair  Speech:  Clear and Coherent  Volume:  Normal  Mood:  Anxious and Irritable  Affect:  Congruent  Thought Process:  Goal Directed and Descriptions of Associations: Intact  Orientation:  Full (Time, Place, and Person)  Thought Content: Logical   Suicidal Thoughts:  No  Homicidal Thoughts:  No  Memory:  Immediate;   Fair Recent;   Fair Remote;   Fair  Judgement:  Fair  Insight:  Fair  Psychomotor Activity:  Normal  Concentration:  Concentration: Fair and Attention Span: Fair  Recall:  [employer] of Knowledge: Fair  Language: Fair  Akathisia:  No  Handed:  Right  AIMS (if indicated):  done  Assets:  Desire for Improvement Housing Transportation  ADL's:  Intact  Cognition: WNL  Sleep:  varies   Screenings: AIMS    Flowsheet Row Office Visit from 10/31/2023 in [facility] Office Visit from 08/02/2023 in [facility] Office Visit from 05/23/2023 in [facility] Office Visit from 04/04/2023 in [facility] Admission (Discharged) from 01/12/2017 in [facility]  AIMS Total Score 0 0 0 0 0      AUDIT    Flowsheet Row Admission (Discharged) from 01/12/2017 in [facility]PATIENT BE[facility]AL MEDICINE  Alcohol Use Disorder Identification Test Final Score (AUDIT) 0      GAD-7    Flowsheet Row Office Visit from 10/31/2023 in [facility] Office Visit from 08/02/2023 in [facility] Office Visit from 05/23/2023 in [facility] Office Visit from 04/04/2023 in [facility]  Total GAD-7 Score 17 6 14 21       PHQ2-9     Flowsheet Row Office Visit from 10/31/2023 in [facility] Office Visit from 08/02/2023 in [name] Office Visit from 05/23/2023 in [facility] Office Visit from 04/04/2023 in [facility] Office Visit from 04/24/2019 in [facility] Total Score 3 0 0 2 0  PHQ-9 Total Score 9 -- -- 6 --      Flowsheet Row Office Visit from 10/31/2023 in [facility] Office Visit from 08/02/2023 in [facility] Office Visit from 05/23/2023 in [facility]  C-SSRS RISK CATEGORY Moderate Risk No Risk Moderate Risk        Assessment and Plan: Chad Salvesen is a 68 year old Caucasian female, married, currently on disability, lives in [place], with recent changes in her mood due to discontinuation of Abilify  due to side effects.  Discussed assessment and plan as noted below.  Bipolar disorder type I mixed moderate-unstable Currently with worsening mood symptoms irritability, anxiety after discontinuation of Abilify  due to side effects.  Also has interpersonal relationship issues with her husband which is also a trigger.  She is not interested in referral for psychotherapy rather has good social support system and reports she has strategies that she has been using to cope.  She is interested in addition of Lamictal to address her mood symptoms. - Start Lamictal 25 mg daily for 15 days and increase to 25 mg twice a day after that. - Provided medication education including risk of Stevens-Johnson syndrome.  Patient to monitor for any side effects and  get immediate help.   PTSD-unstable Currently worsening anxiety mostly triggered by situational stressors.  She declines  referral for therapy. - Start Lamictal as noted above. - Continue Mirtazapine  22.5 mg at bedtime - Continue Propranolol  10 mg twice a day as needed. - Continue Hydroxyzine  25 mg 3 times a day as needed for anxiety.  Insomnia-stable Currently reports overall sleep is good. - Continue hygiene techniques. - Continue Mirtazapine  22.5 mg at bedtime  Follow-up Follow-up in clinic in 4 weeks or sooner if needed.    Collaboration of Care: Collaboration of Care: Patient refused AEB patient refused referral for CBT  Patient/Guardian was advised Release of Information must be obtained prior to any record release in order to collaborate their care with an outside provider. Patient/Guardian was advised if they have not already done so to contact the registration department to sign all necessary forms in order for us  to release information regarding their care.   Consent: Patient/Guardian gives verbal consent for treatment and assignment of benefits for services provided during this visit. Patient/Guardian expressed understanding and agreed to proceed.  This note was generated in part or whole with voice recognition software. Voice recognition is usually quite accurate but there are transcription errors that can and very often do occur. I apologize for any typographical errors that were not detected and corrected.     Zeenat Jeanbaptiste, MD 10/31/2023, 5:08 PM

## 2023-11-03 ENCOUNTER — Other Ambulatory Visit: Payer: Self-pay | Admitting: Psychiatry

## 2023-11-03 DIAGNOSIS — F411 Generalized anxiety disorder: Secondary | ICD-10-CM

## 2023-11-15 ENCOUNTER — Other Ambulatory Visit (INDEPENDENT_AMBULATORY_CARE_PROVIDER_SITE_OTHER): Payer: Self-pay | Admitting: Nurse Practitioner

## 2023-11-15 DIAGNOSIS — I6523 Occlusion and stenosis of bilateral carotid arteries: Secondary | ICD-10-CM

## 2023-11-19 ENCOUNTER — Telehealth: Payer: Self-pay

## 2023-11-19 NOTE — Telephone Encounter (Signed)
 Please keep her on a list to call.

## 2023-11-19 NOTE — Telephone Encounter (Signed)
 Please schedule her for a follow-up appointment,VIRTUAL , if she is interested in medication management.

## 2023-11-19 NOTE — Telephone Encounter (Signed)
 pt left a message that she stopped taking the lamictal . states she was having headaches and heart palpations. pt was last seen on 5-14 next appt 7-15

## 2023-11-20 ENCOUNTER — Telehealth: Payer: Self-pay | Admitting: Psychiatry

## 2023-11-20 NOTE — Telephone Encounter (Signed)
 PT called to cancel her July follow up with you. She states she will get another provider for medications.

## 2023-11-21 DIAGNOSIS — I739 Peripheral vascular disease, unspecified: Secondary | ICD-10-CM | POA: Insufficient documentation

## 2023-11-21 NOTE — Progress Notes (Signed)
 MRN : [id number]  Katrina Holmes is a 68 y.o. (04-Feb-1956) female who presents with chief complaint of check carotid arteries.  History of Present Illness:   The patient is seen for follow up evaluation of carotid stenosis status post left carotid stent on 12/01/2020.  This is the patient's first follow-up.  The patient was unable to follow-up after her intervention due to issues with insurance.  She notes there were no post operative problems or complications related to the surgery.  The patient denies neck or incisional pain.   The patient denies interval amaurosis fugax. There is no recent history of TIA symptoms or focal motor deficits. There is no prior documented CVA.   The patient denies headache.   The patient is taking 75 mg Plavix  daily   No recent shortening of the patient's walking distance or new symptoms consistent with claudication.  No history of rest pain symptoms. No new ulcers or wounds of the lower extremities have occurred.   There is no history of DVT, PE or superficial thrombophlebitis. No recent episodes of angina or shortness of breath documented.    Prior to intervention the patient had a greater than 90% stenosis of the left ICA.    Duplex ultrasound of the carotid arteries done today shows a 1-39% stenosis of the right CCA/ICA and 1-39% stenosis of the left ICA.  Bilateral vertebral arteries have antegrade flow with normal flow hemodynamics in the bilateral subclavian arteries.  No outpatient medications have been marked as taking for the 11/22/23 encounter (Appointment) with Prescilla Brod, Ninette Basque, MD.    Past Medical History:  Diagnosis Date   Allergy    Anxiety    Degenerative disc disease, lumbar    Depression    Hyperlipidemia    Hypertension    IBS (irritable bowel syndrome) 15 years   PTSD (post-traumatic stress disorder)    Sciatica    Sciatica    Suicide attempt [facility])     Past Surgical History:  Procedure  Laterality Date   ABDOMINAL HYSTERECTOMY     CAROTID PTA/STENT INTERVENTION Left 12/01/2020   Procedure: CAROTID PTA/STENT INTERVENTION;  Surgeon: Jackquelyn Mass, MD;  Location: [facility] INVASIVE CV LAB;  Service: Cardiovascular;  Laterality: Left;   MANDIBLE SURGERY     NECK SURGERY  2006   SHOULDER ARTHROSCOPY WITH ROTATOR CUFF REPAIR Left    3 surgeries (2006-2007-2008)    Social History Social History   Tobacco Use   Smoking status: Former    Current packs/day: 1.00    Types: Cigarettes   Smokeless tobacco: Never  Vaping Use   Vaping status: Never Used  Substance Use Topics   Alcohol use: No   Drug use: No    Family History Family History  Problem Relation Age of Onset   Drug abuse Mother    Heart disease Mother    Mental illness Mother    Stroke Father    Alcohol abuse Father    Alcohol abuse Sister    Alcohol abuse Sister    Alcohol abuse Sister    Alcohol abuse Brother     Allergies  Allergen Reactions   Lisinopril Nausea Only and Swelling    Dehydration Tongue swelling Dehydration Tongue swelling    Peanut Oil Other (See Comments)    Admitted for anaphylaxis on 01/11/17 after reporting exposure to peanut butter per  outside records. Admitted for anaphylaxis on 01/11/17 after reporting exposure to peanut butter per outside records. Admitted for anaphylaxis on 01/11/17 after reporting exposure to peanut butter per outside records.    Gabapentin Other (See Comments)    Bloating  Bloating   Nsaids Nausea And Vomiting   Tramadol  Nausea And Vomiting   Trazodone  And Nefazodone Diarrhea    And hallucinations    Penicillins Rash    .Has patient had a PCN reaction causing immediate rash, facial/tongue/throat swelling, SOB or lightheadedness with hypotension: Unknown Has patient had a PCN reaction causing severe rash involving mucus membranes or skin necrosis: Unknown Has patient had a PCN reaction that required hospitalization: Unknown Has patient had a PCN  reaction occurring within the last 10 years: Unknown If all of the above answers are "NO", then may proceed with Cephalosporin use.      REVIEW OF SYSTEMS (Negative unless checked)  Constitutional: [] Weight loss  [] Fever  [] Chills Cardiac: [] Chest pain   [] Chest pressure   [] Palpitations   [] Shortness of breath when laying flat   [] Shortness of breath with exertion. Vascular:  [x] Pain in legs with walking   [] Pain in legs at rest  [] History of DVT   [] Phlebitis   [] Swelling in legs   [] Varicose veins   [] Non-healing ulcers Pulmonary:   [] Uses home oxygen   [] Productive cough   [] Hemoptysis   [] Wheeze  [] COPD   [] Asthma Neurologic:  [] Dizziness   [] Seizures   [] History of stroke   [] History of TIA  [] Aphasia   [] Vissual changes   [] Weakness or numbness in arm   [] Weakness or numbness in leg Musculoskeletal:   [] Joint swelling   [x] Joint pain   [x] Low back pain Hematologic:  [] Easy bruising  [] Easy bleeding   [] Hypercoagulable state   [] Anemic Gastrointestinal:  [] Diarrhea   [] Vomiting  [x] Gastroesophageal reflux/heartburn   [] Difficulty swallowing. Genitourinary:  [] Chronic kidney disease   [] Difficult urination  [] Frequent urination   [] Blood in urine Skin:  [] Rashes   [] Ulcers  Psychological:  [x] History of anxiety   []  History of major depression.  Physical Examination  There were no vitals filed for this visit. There is no height or weight on file to calculate BMI. Gen: WD/WN, NAD Head: [place]/AT, No temporalis wasting.  Ear/Nose/Throat: Hearing grossly intact, nares w/o erythema or drainage Eyes: PER, EOMI, sclera nonicteric.  Neck: Supple, no masses.  No bruit or JVD.  Pulmonary:  Good air movement, no audible wheezing, no use of accessory muscles.  Cardiac: RRR, normal S1, S2, no Murmurs. Vascular:  carotid bruit noted Vessel Right Left  Radial Palpable Palpable  Carotid  Palpable  Palpable  Subclav  Palpable Palpable  Gastrointestinal: soft, non-distended. No guarding/no  peritoneal signs.  Musculoskeletal: M/S 5/5 throughout.  No visible deformity.  Neurologic: CN 2-12 intact. Pain and light touch intact in extremities.  Symmetrical.  Speech is fluent. Motor exam as listed above. Psychiatric: Judgment intact, Mood & affect appropriate for pt's clinical situation. Dermatologic: No rashes or ulcers noted.  No changes consistent with cellulitis.   CBC Lab Results  Component Value Date   WBC 7.3 12/02/2020   HGB 11.3 (L) 12/02/2020   HCT 34.4 (L) 12/02/2020   MCV 85.4 12/02/2020   PLT 273 12/02/2020    BMET    Component Value Date/Time   NA 141 12/02/2020 0557   NA 140 10/03/2014 1342   K 4.4 12/02/2020 0557   K 3.7 10/03/2014 1342   CL 111 12/02/2020 0557  CL 105 10/03/2014 1342   CO2 27 12/02/2020 0557   CO2 25 10/03/2014 1342   GLUCOSE 103 (H) 12/02/2020 0557   GLUCOSE 106 (H) 10/03/2014 1342   BUN 8 12/02/2020 0557   BUN 13 10/03/2014 1342   CREATININE 0.49 12/02/2020 0557   CREATININE 0.65 10/03/2014 1342   CALCIUM 8.7 (L) 12/02/2020 0557   CALCIUM 9.5 10/03/2014 1342   GFRNONAA >60 12/02/2020 0557   GFRNONAA >60 10/03/2014 1342   GFRAA >60 03/09/2019 1542   GFRAA >60 10/03/2014 1342   CrCl cannot be calculated (Patient's most recent lab result is older than the maximum 21 days allowed.).  COAG No results found for: "INR", "PROTIME"  Radiology No results found.   Assessment/Plan 1. 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  Continue dual antiplatelet therapy as prescribed for a minimum of 6 months Continue management of CAD, HTN and Hyperlipidemia Healthy heart diet,  encouraged exercise at least 4 times per week  The patient's NIHSS score is as follows: 0 Mild: 1 - 5 Mild to Moderately Severe: 5 - 14 Severe: 15 - 24 Very Severe: >25  Follow up in 6 months with duplex ultrasound  and physical exam based on the patient's carotid surgery and less than 50% stenosis of the right carotid artery  - VAS US  CAROTID; Future  2. PAD (peripheral artery disease) (HCC)  Recommend:  The patient has evidence of atherosclerosis of the lower extremities with claudication.  The patient does not voice lifestyle limiting changes at this point in time.  Noninvasive studies do not suggest clinically significant change.  No invasive studies, angiography or surgery at this time The patient should continue walking and begin a more formal exercise program.  The patient should continue antiplatelet therapy and aggressive treatment of the lipid abnormalities  No changes in the patient's medications at this time  Continued surveillance is indicated as atherosclerosis is likely to progress with time.    The patient will continue follow up with noninvasive studies as ordered.   3. Primary hypertension Continue antihypertensive medications as already ordered, these medications have been reviewed and there are no changes at this time.  4. Mixed hyperlipidemia Continue statin as ordered and reviewed, no changes at this time    Devon Fogo, MD  11/21/2023 10:50 AM

## 2023-11-22 ENCOUNTER — Encounter (INDEPENDENT_AMBULATORY_CARE_PROVIDER_SITE_OTHER): Payer: Self-pay | Admitting: Vascular Surgery

## 2023-11-22 ENCOUNTER — Ambulatory Visit (INDEPENDENT_AMBULATORY_CARE_PROVIDER_SITE_OTHER): Payer: 59

## 2023-11-22 ENCOUNTER — Ambulatory Visit (INDEPENDENT_AMBULATORY_CARE_PROVIDER_SITE_OTHER): Payer: 59 | Admitting: Vascular Surgery

## 2023-11-22 VITALS — BP 169/81 | HR 88 | Resp 18 | Ht 66.0 in | Wt 155.8 lb

## 2023-11-22 DIAGNOSIS — I6523 Occlusion and stenosis of bilateral carotid arteries: Secondary | ICD-10-CM

## 2023-11-22 DIAGNOSIS — E782 Mixed hyperlipidemia: Secondary | ICD-10-CM

## 2023-11-22 DIAGNOSIS — I10 Essential (primary) hypertension: Secondary | ICD-10-CM

## 2023-11-22 DIAGNOSIS — I739 Peripheral vascular disease, unspecified: Secondary | ICD-10-CM

## 2023-11-26 ENCOUNTER — Other Ambulatory Visit (INDEPENDENT_AMBULATORY_CARE_PROVIDER_SITE_OTHER): Payer: Self-pay | Admitting: Nurse Practitioner

## 2023-12-13 DIAGNOSIS — F1321 Sedative, hypnotic or anxiolytic dependence, in remission: Secondary | ICD-10-CM | POA: Diagnosis not present

## 2023-12-13 DIAGNOSIS — F39 Unspecified mood [affective] disorder: Secondary | ICD-10-CM | POA: Diagnosis not present

## 2023-12-13 DIAGNOSIS — F1211 Cannabis abuse, in remission: Secondary | ICD-10-CM | POA: Diagnosis not present

## 2023-12-13 DIAGNOSIS — F4312 Post-traumatic stress disorder, chronic: Secondary | ICD-10-CM | POA: Diagnosis not present

## 2023-12-13 DIAGNOSIS — F5105 Insomnia due to other mental disorder: Secondary | ICD-10-CM | POA: Diagnosis not present

## 2023-12-13 DIAGNOSIS — F1111 Opioid abuse, in remission: Secondary | ICD-10-CM | POA: Diagnosis not present

## 2023-12-13 DIAGNOSIS — F1021 Alcohol dependence, in remission: Secondary | ICD-10-CM | POA: Diagnosis not present

## 2023-12-13 DIAGNOSIS — F411 Generalized anxiety disorder: Secondary | ICD-10-CM | POA: Diagnosis not present

## 2024-01-01 ENCOUNTER — Ambulatory Visit: Admitting: Psychiatry

## 2024-01-03 DIAGNOSIS — F411 Generalized anxiety disorder: Secondary | ICD-10-CM | POA: Diagnosis not present

## 2024-01-03 DIAGNOSIS — F1321 Sedative, hypnotic or anxiolytic dependence, in remission: Secondary | ICD-10-CM | POA: Diagnosis not present

## 2024-01-03 DIAGNOSIS — F4312 Post-traumatic stress disorder, chronic: Secondary | ICD-10-CM | POA: Diagnosis not present

## 2024-01-03 DIAGNOSIS — F39 Unspecified mood [affective] disorder: Secondary | ICD-10-CM | POA: Diagnosis not present

## 2024-01-03 DIAGNOSIS — F1111 Opioid abuse, in remission: Secondary | ICD-10-CM | POA: Diagnosis not present

## 2024-01-03 DIAGNOSIS — F1211 Cannabis abuse, in remission: Secondary | ICD-10-CM | POA: Diagnosis not present

## 2024-01-03 DIAGNOSIS — F1021 Alcohol dependence, in remission: Secondary | ICD-10-CM | POA: Diagnosis not present

## 2024-01-03 DIAGNOSIS — F5105 Insomnia due to other mental disorder: Secondary | ICD-10-CM | POA: Diagnosis not present

## 2024-01-17 ENCOUNTER — Other Ambulatory Visit: Payer: Self-pay | Admitting: Psychiatry

## 2024-01-17 DIAGNOSIS — F411 Generalized anxiety disorder: Secondary | ICD-10-CM

## 2024-02-25 DIAGNOSIS — R7303 Prediabetes: Secondary | ICD-10-CM | POA: Diagnosis not present

## 2024-02-25 DIAGNOSIS — Z0000 Encounter for general adult medical examination without abnormal findings: Secondary | ICD-10-CM | POA: Diagnosis not present

## 2024-02-25 DIAGNOSIS — I10 Essential (primary) hypertension: Secondary | ICD-10-CM | POA: Diagnosis not present

## 2024-02-25 DIAGNOSIS — E785 Hyperlipidemia, unspecified: Secondary | ICD-10-CM | POA: Diagnosis not present

## 2024-03-03 DIAGNOSIS — E785 Hyperlipidemia, unspecified: Secondary | ICD-10-CM | POA: Diagnosis not present

## 2024-03-03 DIAGNOSIS — I10 Essential (primary) hypertension: Secondary | ICD-10-CM | POA: Diagnosis not present

## 2024-03-03 DIAGNOSIS — K580 Irritable bowel syndrome with diarrhea: Secondary | ICD-10-CM | POA: Diagnosis not present

## 2024-03-03 DIAGNOSIS — K219 Gastro-esophageal reflux disease without esophagitis: Secondary | ICD-10-CM | POA: Diagnosis not present

## 2024-03-03 DIAGNOSIS — Z1331 Encounter for screening for depression: Secondary | ICD-10-CM | POA: Diagnosis not present

## 2024-03-03 DIAGNOSIS — I6522 Occlusion and stenosis of left carotid artery: Secondary | ICD-10-CM | POA: Diagnosis not present

## 2024-03-03 DIAGNOSIS — R7303 Prediabetes: Secondary | ICD-10-CM | POA: Diagnosis not present

## 2024-03-03 DIAGNOSIS — Z0000 Encounter for general adult medical examination without abnormal findings: Secondary | ICD-10-CM | POA: Diagnosis not present

## 2024-03-04 ENCOUNTER — Other Ambulatory Visit: Payer: Self-pay | Admitting: Family Medicine

## 2024-03-04 DIAGNOSIS — Z1231 Encounter for screening mammogram for malignant neoplasm of breast: Secondary | ICD-10-CM

## 2024-03-26 ENCOUNTER — Other Ambulatory Visit: Payer: Self-pay | Admitting: *Deleted

## 2024-03-26 ENCOUNTER — Inpatient Hospital Stay
Admission: RE | Admit: 2024-03-26 | Discharge: 2024-03-26 | Disposition: A | Discharge status: Home / Self Care | Payer: Self-pay | Source: Ambulatory Visit | Attending: Family Medicine | Admitting: Family Medicine

## 2024-03-26 DIAGNOSIS — Z1231 Encounter for screening mammogram for malignant neoplasm of breast: Secondary | ICD-10-CM

## 2024-03-27 ENCOUNTER — Inpatient Hospital Stay
Admission: RE | Admit: 2024-03-27 | Discharge: 2024-03-27 | Disposition: A | Discharge status: Home / Self Care | Payer: Self-pay | Source: Ambulatory Visit | Attending: Family Medicine | Admitting: Family Medicine

## 2024-03-27 ENCOUNTER — Other Ambulatory Visit: Payer: Self-pay | Admitting: *Deleted

## 2024-03-27 DIAGNOSIS — Z1231 Encounter for screening mammogram for malignant neoplasm of breast: Secondary | ICD-10-CM

## 2024-03-28 ENCOUNTER — Ambulatory Visit
Admission: RE | Admit: 2024-03-28 | Discharge: 2024-03-28 | Disposition: A | Discharge status: Home / Self Care | Source: Ambulatory Visit | Attending: Family Medicine | Admitting: Family Medicine

## 2024-03-28 DIAGNOSIS — Z1231 Encounter for screening mammogram for malignant neoplasm of breast: Secondary | ICD-10-CM | POA: Insufficient documentation

## 2024-04-16 DIAGNOSIS — F1021 Alcohol dependence, in remission: Secondary | ICD-10-CM | POA: Diagnosis not present

## 2024-04-16 DIAGNOSIS — F1111 Opioid abuse, in remission: Secondary | ICD-10-CM | POA: Diagnosis not present

## 2024-04-16 DIAGNOSIS — F4312 Post-traumatic stress disorder, chronic: Secondary | ICD-10-CM | POA: Diagnosis not present

## 2024-04-16 DIAGNOSIS — F1321 Sedative, hypnotic or anxiolytic dependence, in remission: Secondary | ICD-10-CM | POA: Diagnosis not present

## 2024-04-16 DIAGNOSIS — F411 Generalized anxiety disorder: Secondary | ICD-10-CM | POA: Diagnosis not present

## 2024-04-16 DIAGNOSIS — F39 Unspecified mood [affective] disorder: Secondary | ICD-10-CM | POA: Diagnosis not present

## 2024-04-16 DIAGNOSIS — F5105 Insomnia due to other mental disorder: Secondary | ICD-10-CM | POA: Diagnosis not present

## 2024-04-16 DIAGNOSIS — F1211 Cannabis abuse, in remission: Secondary | ICD-10-CM | POA: Diagnosis not present

## 2024-05-05 DIAGNOSIS — F1111 Opioid abuse, in remission: Secondary | ICD-10-CM | POA: Diagnosis not present

## 2024-05-05 DIAGNOSIS — F4312 Post-traumatic stress disorder, chronic: Secondary | ICD-10-CM | POA: Diagnosis not present

## 2024-05-05 DIAGNOSIS — F411 Generalized anxiety disorder: Secondary | ICD-10-CM | POA: Diagnosis not present

## 2024-05-05 DIAGNOSIS — F1211 Cannabis abuse, in remission: Secondary | ICD-10-CM | POA: Diagnosis not present

## 2024-05-05 DIAGNOSIS — F5105 Insomnia due to other mental disorder: Secondary | ICD-10-CM | POA: Diagnosis not present

## 2024-05-05 DIAGNOSIS — F1321 Sedative, hypnotic or anxiolytic dependence, in remission: Secondary | ICD-10-CM | POA: Diagnosis not present

## 2024-05-05 DIAGNOSIS — F1021 Alcohol dependence, in remission: Secondary | ICD-10-CM | POA: Diagnosis not present

## 2024-05-05 DIAGNOSIS — F39 Unspecified mood [affective] disorder: Secondary | ICD-10-CM | POA: Diagnosis not present

## 2024-05-08 NOTE — Progress Notes (Signed)
 Katrina Holmes                                          MRN: [id number]   05/08/2024   The [facility] Quality Team Specialist reviewed this patient medical record for the purposes of chart review for care gap closure. The following were reviewed: chart review for care gap closure-controlling blood pressure.    [facility] Quality Team

## 2024-05-24 ENCOUNTER — Other Ambulatory Visit (INDEPENDENT_AMBULATORY_CARE_PROVIDER_SITE_OTHER): Payer: Self-pay | Admitting: Nurse Practitioner

## 2024-11-27 ENCOUNTER — Encounter (INDEPENDENT_AMBULATORY_CARE_PROVIDER_SITE_OTHER)

## 2024-11-27 ENCOUNTER — Ambulatory Visit (INDEPENDENT_AMBULATORY_CARE_PROVIDER_SITE_OTHER): Admitting: Vascular Surgery
